# Patient Record
Sex: Male | Born: 1945 | Hispanic: No | Marital: Married | Smoking: Former smoker
Health system: Southern US, Community
[De-identification: ages and names within clinical notes are randomized; demographics above are authoritative.]

## PROBLEM LIST (undated history)

## (undated) DIAGNOSIS — J90 Pleural effusion, not elsewhere classified: Secondary | ICD-10-CM

## (undated) DIAGNOSIS — Z9989 Dependence on other enabling machines and devices: Secondary | ICD-10-CM

## (undated) DIAGNOSIS — I4891 Unspecified atrial fibrillation: Secondary | ICD-10-CM

## (undated) DIAGNOSIS — R202 Paresthesia of skin: Secondary | ICD-10-CM

## (undated) DIAGNOSIS — R42 Dizziness and giddiness: Secondary | ICD-10-CM

## (undated) DIAGNOSIS — K2270 Barrett's esophagus without dysplasia: Secondary | ICD-10-CM

## (undated) DIAGNOSIS — F32A Depression, unspecified: Secondary | ICD-10-CM

## (undated) DIAGNOSIS — K069 Disorder of gingiva and edentulous alveolar ridge, unspecified: Secondary | ICD-10-CM

## (undated) DIAGNOSIS — K449 Diaphragmatic hernia without obstruction or gangrene: Secondary | ICD-10-CM

## (undated) DIAGNOSIS — E785 Hyperlipidemia, unspecified: Secondary | ICD-10-CM

## (undated) DIAGNOSIS — N200 Calculus of kidney: Secondary | ICD-10-CM

## (undated) DIAGNOSIS — G4733 Obstructive sleep apnea (adult) (pediatric): Secondary | ICD-10-CM

## (undated) DIAGNOSIS — M21372 Foot drop, left foot: Secondary | ICD-10-CM

## (undated) DIAGNOSIS — K5909 Other constipation: Secondary | ICD-10-CM

## (undated) DIAGNOSIS — C801 Malignant (primary) neoplasm, unspecified: Secondary | ICD-10-CM

## (undated) DIAGNOSIS — I10 Essential (primary) hypertension: Secondary | ICD-10-CM

## (undated) DIAGNOSIS — I509 Heart failure, unspecified: Secondary | ICD-10-CM

## (undated) DIAGNOSIS — N189 Chronic kidney disease, unspecified: Secondary | ICD-10-CM

## (undated) DIAGNOSIS — F4310 Post-traumatic stress disorder, unspecified: Secondary | ICD-10-CM

## (undated) DIAGNOSIS — R200 Anesthesia of skin: Secondary | ICD-10-CM

## (undated) DIAGNOSIS — J189 Pneumonia, unspecified organism: Secondary | ICD-10-CM

## (undated) DIAGNOSIS — I2510 Atherosclerotic heart disease of native coronary artery without angina pectoris: Secondary | ICD-10-CM

## (undated) DIAGNOSIS — K635 Polyp of colon: Secondary | ICD-10-CM

## (undated) DIAGNOSIS — I639 Cerebral infarction, unspecified: Secondary | ICD-10-CM

## (undated) HISTORY — DX: Calculus of kidney: N20.0

## (undated) HISTORY — DX: Diaphragmatic hernia without obstruction or gangrene: K44.9

## (undated) HISTORY — PX: COLONOSCOPY: SHX174

## (undated) HISTORY — DX: Pleural effusion, not elsewhere classified: J90

## (undated) HISTORY — DX: Heart failure, unspecified: I50.9

## (undated) HISTORY — DX: Cerebral infarction, unspecified: I63.9

## (undated) HISTORY — DX: Polyp of colon: K63.5

## (undated) HISTORY — DX: Dizziness and giddiness: R42

## (undated) HISTORY — DX: Obstructive sleep apnea (adult) (pediatric): Z99.89

## (undated) HISTORY — DX: Anesthesia of skin: R20.0

## (undated) HISTORY — DX: Other constipation: K59.09

## (undated) HISTORY — DX: Chronic kidney disease, unspecified: N18.9

## (undated) HISTORY — DX: Atherosclerotic heart disease of native coronary artery without angina pectoris: I25.10

## (undated) HISTORY — DX: Hyperlipidemia, unspecified: E78.5

## (undated) HISTORY — PX: COLON SURGERY: SHX602

## (undated) HISTORY — DX: Paresthesia of skin: R20.2

## (undated) HISTORY — DX: Essential (primary) hypertension: I10

## (undated) HISTORY — DX: Unspecified atrial fibrillation: I48.91

## (undated) HISTORY — DX: Post-traumatic stress disorder, unspecified: F43.10

## (undated) HISTORY — PX: CORONARY ARTERY BYPASS GRAFT: SHX141

## (undated) HISTORY — DX: Disorder of gingiva and edentulous alveolar ridge, unspecified: K06.9

## (undated) HISTORY — DX: Pneumonia, unspecified organism: J18.9

## (undated) HISTORY — DX: Obstructive sleep apnea (adult) (pediatric): G47.33

## (undated) HISTORY — DX: Depression, unspecified: F32.A

## (undated) HISTORY — PX: CATARACT EXTRACTION: SUR2

## (undated) HISTORY — DX: Malignant (primary) neoplasm, unspecified: C80.1

## (undated) HISTORY — DX: Foot drop, left foot: M21.372

## (undated) HISTORY — DX: Barrett's esophagus without dysplasia: K22.70

---

## 2002-10-08 HISTORY — PX: CORONARY ARTERY BYPASS GRAFT: SHX141

## 2005-10-08 DIAGNOSIS — C61 Malignant neoplasm of prostate: Secondary | ICD-10-CM

## 2005-10-08 HISTORY — DX: Malignant neoplasm of prostate: C61

## 2006-10-08 DIAGNOSIS — J849 Interstitial pulmonary disease, unspecified: Secondary | ICD-10-CM

## 2006-10-08 HISTORY — DX: Interstitial pulmonary disease, unspecified: J84.9

## 2008-03-09 ENCOUNTER — Encounter: Admission: RE | Admit: 2008-03-09 | Discharge: 2008-04-12 | Payer: Self-pay | Admitting: Family Medicine

## 2008-08-07 ENCOUNTER — Emergency Department (HOSPITAL_COMMUNITY): Admission: EM | Admit: 2008-08-07 | Discharge: 2008-08-07 | Payer: Self-pay | Admitting: *Deleted

## 2009-07-21 ENCOUNTER — Emergency Department (HOSPITAL_COMMUNITY): Admission: EM | Admit: 2009-07-21 | Discharge: 2009-07-21 | Payer: Self-pay | Admitting: Emergency Medicine

## 2011-01-11 LAB — CBC
HCT: 40.6 % (ref 39.0–52.0)
Hemoglobin: 13.8 g/dL (ref 13.0–17.0)
MCHC: 34 g/dL (ref 30.0–36.0)
MCV: 90.6 fL (ref 78.0–100.0)
RBC: 4.48 MIL/uL (ref 4.22–5.81)
RDW: 13.4 % (ref 11.5–15.5)
WBC: 8.6 10*3/uL (ref 4.0–10.5)

## 2011-01-11 LAB — POCT I-STAT, CHEM 8
Calcium, Ion: 1.18 mmol/L (ref 1.12–1.32)
HCT: 39 % (ref 39.0–52.0)
Hemoglobin: 13.3 g/dL (ref 13.0–17.0)
Potassium: 5.5 mEq/L — ABNORMAL HIGH (ref 3.5–5.1)

## 2011-01-11 LAB — APTT: aPTT: 24 seconds (ref 24–37)

## 2011-01-11 LAB — URINALYSIS, ROUTINE W REFLEX MICROSCOPIC
Glucose, UA: NEGATIVE mg/dL
Ketones, ur: NEGATIVE mg/dL
Leukocytes, UA: NEGATIVE
Nitrite: NEGATIVE

## 2011-01-11 LAB — DIFFERENTIAL
Eosinophils Absolute: 0.1 10*3/uL (ref 0.0–0.7)
Eosinophils Relative: 1 % (ref 0–5)
Lymphocytes Relative: 25 % (ref 12–46)
Monocytes Absolute: 0.8 10*3/uL (ref 0.1–1.0)
Neutro Abs: 5.5 10*3/uL (ref 1.7–7.7)

## 2011-01-11 LAB — COMPREHENSIVE METABOLIC PANEL
AST: 25 U/L (ref 0–37)
Albumin: 3.4 g/dL — ABNORMAL LOW (ref 3.5–5.2)
Chloride: 107 mEq/L (ref 96–112)
Creatinine, Ser: 1.49 mg/dL (ref 0.4–1.5)
GFR calc Af Amer: 58 mL/min — ABNORMAL LOW (ref >60)
Sodium: 136 mEq/L (ref 135–145)
Total Protein: 6.6 g/dL (ref 6.0–8.3)

## 2011-01-11 LAB — URINE MICROSCOPIC-ADD ON

## 2011-01-11 LAB — LACTIC ACID, PLASMA: Lactic Acid, Venous: 2.2 mmol/L (ref 0.5–2.2)

## 2011-04-26 ENCOUNTER — Ambulatory Visit (INDEPENDENT_AMBULATORY_CARE_PROVIDER_SITE_OTHER): Payer: Self-pay | Admitting: Surgery

## 2011-04-26 ENCOUNTER — Ambulatory Visit (INDEPENDENT_AMBULATORY_CARE_PROVIDER_SITE_OTHER): Payer: Medicare Other | Admitting: Surgery

## 2011-04-26 ENCOUNTER — Encounter (INDEPENDENT_AMBULATORY_CARE_PROVIDER_SITE_OTHER): Payer: Self-pay | Admitting: Surgery

## 2011-04-26 ENCOUNTER — Telehealth (INDEPENDENT_AMBULATORY_CARE_PROVIDER_SITE_OTHER): Payer: Self-pay | Admitting: General Surgery

## 2011-04-26 VITALS — BP 100/62 | HR 72 | Temp 96.4°F | Ht 68.0 in | Wt 210.8 lb

## 2011-04-26 DIAGNOSIS — D126 Benign neoplasm of colon, unspecified: Secondary | ICD-10-CM

## 2011-04-26 DIAGNOSIS — K635 Polyp of colon: Secondary | ICD-10-CM

## 2011-04-26 NOTE — Progress Notes (Signed)
Subjective:     Patient ID: Walter [name], male   DOB: [birth year], 65 y.o.   MRN: [id number]  HPIMr. Ruan and his wife came in today to discuss the recurrent polyp that he's had in his hepatic flexure. Dr. Jeannetta Ellis has been following and debulking a benign polyp in his hepatic flexure (according to the patient procedure closed that recently has shown more tendency toward dysplasia. This has been evolved over the last 3 years and now Dr. Jake Shark is recommended resection.  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  They have talked about proceeding with colectomy. We discussed many questions about the surgery and its risks and complications. He seems to understand these very well. My approach would be a laparoscopically assisted right hemicolectomy. I encouraged him to go on a two-week low carb diet preoperatively to get himself and better metabolic shape. He is a type II diabetic followed by [facility] endocrinology.  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  He is on and do blood thinner because he has a tendency to have episodes of atrial fibrillation. He is followed by Garnette Scheuermann.  Plan to admit him to [facility] long for a laparoscopically assisted right hemicolectomy. Dr. Jeannetta Ellis may colonoscope him a preop for either further tattooing or marking the area. Last Dr. Katrinka Blazing to clear from the standpoint of his heart.     Review of Systems  Genitourinary:       He has a congenitally absent left kidney  All other systems reviewed and are negative.       Objective:   Physical Exam  Vitals reviewed. Constitutional: He is oriented to person, place, and time. He appears well-developed and well-nourished.  HENT:  Head: Normocephalic and atraumatic.  Eyes: Pupils are equal, round, and reactive to light.  Neck: Normal range of motion. Neck supple.  Cardiovascular: Normal rate, regular rhythm and normal heart sounds.   Pulmonary/Chest: Effort normal and  breath sounds normal.  Abdominal: Soft. Bowel sounds are normal. He exhibits no distension and no mass. There is no tenderness. There is no rebound and no guarding.  Musculoskeletal: Normal range of motion.  Neurological: He is alert and oriented to person, place, and time. He has normal reflexes.  Skin: Skin is warm and dry.  Psychiatric: He has a normal mood and affect. His behavior is normal. Judgment and thought content normal.       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 Plan laparoscopically assisted right hemicolectomy     Plan:    Plan laparoscopically assisted right hemicolectomy

## 2011-04-26 NOTE — Telephone Encounter (Signed)
Verified Walter [name] was a patient of Dr Katrinka Blazing faxed order for cardiac clearance to Lupita Leash [phone]

## 2011-05-08 ENCOUNTER — Telehealth (INDEPENDENT_AMBULATORY_CARE_PROVIDER_SITE_OTHER): Payer: Self-pay | Admitting: General Surgery

## 2011-05-08 DIAGNOSIS — K635 Polyp of colon: Secondary | ICD-10-CM

## 2011-05-08 MED ORDER — PEG 3350-KCL-NA BICARB-NACL 420 G PO SOLR: 4000.0000 mL | Freq: Once | ORAL | Status: AC

## 2011-05-08 NOTE — Telephone Encounter (Signed)
Called rx of nulytely to [facility] [address]  and sent instructions to patient TA

## 2011-05-09 ENCOUNTER — Other Ambulatory Visit (INDEPENDENT_AMBULATORY_CARE_PROVIDER_SITE_OTHER): Payer: Self-pay | Admitting: Surgery

## 2011-05-09 ENCOUNTER — Ambulatory Visit (HOSPITAL_COMMUNITY)
Admission: RE | Admit: 2011-05-09 | Discharge: 2011-05-09 | Disposition: A | Discharge status: Home / Self Care | Payer: Medicare Other | Source: Ambulatory Visit | Attending: Surgery | Admitting: Surgery

## 2011-05-09 ENCOUNTER — Encounter (HOSPITAL_COMMUNITY): Payer: Medicare Other

## 2011-05-09 DIAGNOSIS — Z8701 Personal history of pneumonia (recurrent): Secondary | ICD-10-CM | POA: Insufficient documentation

## 2011-05-09 DIAGNOSIS — Z951 Presence of aortocoronary bypass graft: Secondary | ICD-10-CM | POA: Insufficient documentation

## 2011-05-09 DIAGNOSIS — Z01818 Encounter for other preprocedural examination: Secondary | ICD-10-CM

## 2011-05-09 DIAGNOSIS — I10 Essential (primary) hypertension: Secondary | ICD-10-CM | POA: Insufficient documentation

## 2011-05-09 DIAGNOSIS — Z01811 Encounter for preprocedural respiratory examination: Secondary | ICD-10-CM | POA: Insufficient documentation

## 2011-05-09 DIAGNOSIS — D126 Benign neoplasm of colon, unspecified: Secondary | ICD-10-CM | POA: Insufficient documentation

## 2011-05-09 DIAGNOSIS — Z79899 Other long term (current) drug therapy: Secondary | ICD-10-CM | POA: Insufficient documentation

## 2011-05-09 HISTORY — PX: COLON SURGERY: SHX602

## 2011-05-09 LAB — BASIC METABOLIC PANEL
BUN: 19 mg/dL (ref 6–23)
CO2: 27 mEq/L (ref 19–32)
Calcium: 10.1 mg/dL (ref 8.4–10.5)
Chloride: 103 mEq/L (ref 96–112)
GFR calc Af Amer: >60 mL/min (ref >60)
Glucose, Bld: 158 mg/dL — ABNORMAL HIGH (ref 70–99)
Potassium: 5.1 mEq/L (ref 3.5–5.1)
Sodium: 137 mEq/L (ref 135–145)

## 2011-05-09 LAB — CBC
HCT: 40.6 % (ref 39.0–52.0)
MCHC: 35 g/dL (ref 30.0–36.0)
MCV: 87.3 fL (ref 78.0–100.0)
Platelets: 205 10*3/uL (ref 150–400)
RBC: 4.65 MIL/uL (ref 4.22–5.81)

## 2011-05-09 LAB — PROTIME-INR: Prothrombin Time: 19.3 seconds — ABNORMAL HIGH (ref 11.6–15.2)

## 2011-05-09 LAB — SURGICAL PCR SCREEN: MRSA, PCR: NEGATIVE

## 2011-05-16 ENCOUNTER — Ambulatory Visit
Admission: RE | Admit: 2011-05-16 | Discharge: 2011-05-16 | Disposition: A | Discharge status: Home / Self Care | Payer: Medicare Other | Source: Ambulatory Visit | Attending: Gastroenterology | Admitting: Gastroenterology

## 2011-05-16 ENCOUNTER — Telehealth (INDEPENDENT_AMBULATORY_CARE_PROVIDER_SITE_OTHER): Payer: Self-pay | Admitting: Surgery

## 2011-05-16 ENCOUNTER — Other Ambulatory Visit: Payer: Self-pay | Admitting: Gastroenterology

## 2011-05-16 DIAGNOSIS — IMO0002 Reserved for concepts with insufficient information to code with codable children: Secondary | ICD-10-CM

## 2011-05-21 ENCOUNTER — Inpatient Hospital Stay (HOSPITAL_COMMUNITY)
Admission: RE | Admit: 2011-05-21 | Discharge: 2011-05-28 | DRG: 331 | Disposition: A | Discharge status: Home / Self Care | Payer: Medicare Other | Source: Ambulatory Visit | Attending: Surgery | Admitting: Surgery

## 2011-05-21 ENCOUNTER — Other Ambulatory Visit (INDEPENDENT_AMBULATORY_CARE_PROVIDER_SITE_OTHER): Payer: Self-pay | Admitting: Surgery

## 2011-05-21 DIAGNOSIS — Z01812 Encounter for preprocedural laboratory examination: Secondary | ICD-10-CM

## 2011-05-21 DIAGNOSIS — J449 Chronic obstructive pulmonary disease, unspecified: Secondary | ICD-10-CM | POA: Diagnosis present

## 2011-05-21 DIAGNOSIS — D126 Benign neoplasm of colon, unspecified: Secondary | ICD-10-CM

## 2011-05-21 DIAGNOSIS — J4489 Other specified chronic obstructive pulmonary disease: Secondary | ICD-10-CM | POA: Diagnosis present

## 2011-05-21 DIAGNOSIS — I10 Essential (primary) hypertension: Secondary | ICD-10-CM | POA: Diagnosis present

## 2011-05-21 DIAGNOSIS — F17200 Nicotine dependence, unspecified, uncomplicated: Secondary | ICD-10-CM | POA: Diagnosis present

## 2011-05-21 DIAGNOSIS — Z951 Presence of aortocoronary bypass graft: Secondary | ICD-10-CM

## 2011-05-21 DIAGNOSIS — I2510 Atherosclerotic heart disease of native coronary artery without angina pectoris: Secondary | ICD-10-CM | POA: Diagnosis present

## 2011-05-21 DIAGNOSIS — I4891 Unspecified atrial fibrillation: Secondary | ICD-10-CM | POA: Diagnosis present

## 2011-05-21 LAB — ABO/RH: ABO/RH(D): B POS

## 2011-05-21 LAB — GLUCOSE, CAPILLARY
Glucose-Capillary: 182 mg/dL — ABNORMAL HIGH (ref 70–99)
Glucose-Capillary: 216 mg/dL — ABNORMAL HIGH (ref 70–99)

## 2011-05-21 LAB — PROTIME-INR
INR: 0.99 (ref 0.00–1.49)
INR: 1 (ref 0.00–1.49)

## 2011-05-21 LAB — TYPE AND SCREEN: ABO/RH(D): B POS

## 2011-05-22 LAB — COMPREHENSIVE METABOLIC PANEL
ALT: 15 U/L (ref 0–53)
CO2: 30 mEq/L (ref 19–32)
Calcium: 9.1 mg/dL (ref 8.4–10.5)
GFR calc Af Amer: >60 mL/min (ref >60)
GFR calc non Af Amer: >60 mL/min (ref >60)
Glucose, Bld: 186 mg/dL — ABNORMAL HIGH (ref 70–99)
Sodium: 137 mEq/L (ref 135–145)
Total Bilirubin: 1.1 mg/dL (ref 0.3–1.2)

## 2011-05-22 LAB — CBC
Hemoglobin: 12.7 g/dL — ABNORMAL LOW (ref 13.0–17.0)
MCH: 31 pg (ref 26.0–34.0)
MCV: 87.1 fL (ref 78.0–100.0)
RBC: 4.1 MIL/uL — ABNORMAL LOW (ref 4.22–5.81)

## 2011-05-22 LAB — GLUCOSE, CAPILLARY
Glucose-Capillary: 170 mg/dL — ABNORMAL HIGH (ref 70–99)
Glucose-Capillary: 144 mg/dL — ABNORMAL HIGH (ref 70–99)

## 2011-05-22 LAB — DIFFERENTIAL
Basophils Relative: 0 % (ref 0–1)
Lymphs Abs: 1.6 10*3/uL (ref 0.7–4.0)
Monocytes Relative: 12 % (ref 3–12)
Neutro Abs: 5.9 10*3/uL (ref 1.7–7.7)
Neutrophils Relative %: 70 % (ref 43–77)

## 2011-05-23 LAB — GLUCOSE, CAPILLARY
Glucose-Capillary: 143 mg/dL — ABNORMAL HIGH (ref 70–99)
Glucose-Capillary: 147 mg/dL — ABNORMAL HIGH (ref 70–99)
Glucose-Capillary: 161 mg/dL — ABNORMAL HIGH (ref 70–99)
Glucose-Capillary: 166 mg/dL — ABNORMAL HIGH (ref 70–99)

## 2011-05-24 LAB — GLUCOSE, CAPILLARY
Glucose-Capillary: 141 mg/dL — ABNORMAL HIGH (ref 70–99)
Glucose-Capillary: 147 mg/dL — ABNORMAL HIGH (ref 70–99)
Glucose-Capillary: 158 mg/dL — ABNORMAL HIGH (ref 70–99)
Glucose-Capillary: 196 mg/dL — ABNORMAL HIGH (ref 70–99)

## 2011-05-25 LAB — GLUCOSE, CAPILLARY: Glucose-Capillary: 126 mg/dL — ABNORMAL HIGH (ref 70–99)

## 2011-05-26 LAB — GLUCOSE, CAPILLARY
Glucose-Capillary: 175 mg/dL — ABNORMAL HIGH (ref 70–99)
Glucose-Capillary: 173 mg/dL — ABNORMAL HIGH (ref 70–99)

## 2011-05-26 LAB — CBC
Hemoglobin: 12.5 g/dL — ABNORMAL LOW (ref 13.0–17.0)
MCH: 30.5 pg (ref 26.0–34.0)
MCV: 86.8 fL (ref 78.0–100.0)
RBC: 4.1 MIL/uL — ABNORMAL LOW (ref 4.22–5.81)

## 2011-05-26 LAB — DIFFERENTIAL
Lymphs Abs: 1.6 10*3/uL (ref 0.7–4.0)
Monocytes Relative: 15 % — ABNORMAL HIGH (ref 3–12)
Neutro Abs: 3.4 10*3/uL (ref 1.7–7.7)
Neutrophils Relative %: 57 % (ref 43–77)

## 2011-05-26 LAB — BASIC METABOLIC PANEL
CO2: 30 mEq/L (ref 19–32)
Calcium: 9.6 mg/dL (ref 8.4–10.5)
Creatinine, Ser: 0.88 mg/dL (ref 0.50–1.35)
Glucose, Bld: 134 mg/dL — ABNORMAL HIGH (ref 70–99)

## 2011-05-27 LAB — GLUCOSE, CAPILLARY
Glucose-Capillary: 189 mg/dL — ABNORMAL HIGH (ref 70–99)
Glucose-Capillary: 179 mg/dL — ABNORMAL HIGH (ref 70–99)
Glucose-Capillary: 146 mg/dL — ABNORMAL HIGH (ref 70–99)

## 2011-05-28 LAB — BASIC METABOLIC PANEL
CO2: 29 mEq/L (ref 19–32)
Calcium: 9.1 mg/dL (ref 8.4–10.5)
Chloride: 97 mEq/L (ref 96–112)
Glucose, Bld: 173 mg/dL — ABNORMAL HIGH (ref 70–99)
Sodium: 136 mEq/L (ref 135–145)

## 2011-05-28 LAB — DIFFERENTIAL
Basophils Relative: 0 % (ref 0–1)
Lymphs Abs: 1.1 10*3/uL (ref 0.7–4.0)
Monocytes Relative: 24 % — ABNORMAL HIGH (ref 3–12)
Neutro Abs: 2 10*3/uL (ref 1.7–7.7)
Neutrophils Relative %: 48 % (ref 43–77)

## 2011-05-28 LAB — CBC
Hemoglobin: 11.7 g/dL — ABNORMAL LOW (ref 13.0–17.0)
MCH: 30.5 pg (ref 26.0–34.0)
RBC: 3.84 MIL/uL — ABNORMAL LOW (ref 4.22–5.81)
WBC: 4.3 10*3/uL (ref 4.0–10.5)

## 2011-05-30 ENCOUNTER — Other Ambulatory Visit (INDEPENDENT_AMBULATORY_CARE_PROVIDER_SITE_OTHER): Payer: Self-pay | Admitting: General Surgery

## 2011-05-30 ENCOUNTER — Encounter (INDEPENDENT_AMBULATORY_CARE_PROVIDER_SITE_OTHER): Payer: Self-pay | Admitting: Surgery

## 2011-05-30 ENCOUNTER — Ambulatory Visit (INDEPENDENT_AMBULATORY_CARE_PROVIDER_SITE_OTHER): Payer: Medicare Other | Admitting: Surgery

## 2011-05-30 VITALS — Temp 96.5°F

## 2011-05-30 DIAGNOSIS — Z9049 Acquired absence of other specified parts of digestive tract: Secondary | ICD-10-CM

## 2011-05-30 DIAGNOSIS — Z9889 Other specified postprocedural states: Secondary | ICD-10-CM

## 2011-05-30 MED ORDER — OXYCODONE-ACETAMINOPHEN 5-325 MG PO TABS: 1.0000 | ORAL_TABLET | ORAL | Status: AC | PRN

## 2011-05-30 NOTE — Patient Instructions (Signed)
Apply Neosporin daily to wound Take probiotics after completing antibiotics

## 2011-05-30 NOTE — Progress Notes (Signed)
Today he is a followup after her right hemicolectomy complicated by a lateral wound infection. I opened this in the hospital and cultured it. Culture was no growth at 2 days but on Gram stain there gram-negative rods. He is on 5 days of Augmentin. Today his incision looks good. There is minimal drainage laterally. He removed his wick the day after he got home from the hospital. Her recommended probiotics. We'll see him back in 2-3 weeks. Pathology showed no residual polyps.

## 2011-05-31 LAB — WOUND CULTURE: Culture: NO GROWTH

## 2011-06-06 ENCOUNTER — Telehealth (INDEPENDENT_AMBULATORY_CARE_PROVIDER_SITE_OTHER): Payer: Self-pay | Admitting: General Surgery

## 2011-06-13 NOTE — Op Note (Signed)
NAMECHAROD, SLAWINSKI NO.:  [id number]  MEDICAL RECORD NO.:  [id number]  LOCATION:  1525                         FACILITY:  [facility]  PHYSICIAN:  Thornton Park. Daphine Deutscher, MD  DATE OF BIRTH:  14-Jan-1946  DATE OF PROCEDURE: DATE OF DISCHARGE:                              OPERATIVE REPORT   PREOPERATIVE DIAGNOSIS:  Recurrent sessile polyp of the right colon, status post multiple resections, and following by Dr. Matthias Hughs with recent microscopic change more toward dysplasia.  Laparoscopic-assisted right hemicolectomy.  SURGEON:  Thornton Park. Daphine Deutscher, MD  ASSISTANT:  Sandria Bales. Ezzard Standing, MD  ANESTHESIA:  General endotracheal.  DESCRIPTION OF PROCEDURE:  A 65 year old white male was brought to room #1 at [facility] on Monday, May 21, 2011, and given general anesthesia.  The abdomen was prepped with PCMX and draped sterilely. Access to the abdomen was achieved through the left upper quadrant using a 0 degree 5 mm Optiview without difficulty.  The abdomen was inflated and then 3 other 5 mm were used to assist in the mobilization of the right colon.  The patient had a very large omental mass as well as a very fat mesentery.  Dr. Matthias Hughs had tattooed there in and this was readily visible along the ascending colon.  Using the Harmonic scalpel, I mobilized hepatic flexure, the right colon gutter, and the terminal ileum without much difficulty.  I swept across the kidney and visualize the duodenum.  I incorporated one of my incisions which was a transverse trocar incision above the umbilicus and made a transverse incision probably total of 12 cm long.  After I completed it, put in the [facility] wound protector.  I mobilized the colon little more getting the terminal ileum out along with the transverse colon well beyond the tattooed area.  I divided them proximally and distally with GIA and went to the mesentery with Kelly clamps, the Harmonic scalpel, and I oversewed the  staying side with 2-0 silk suture ligatures.  A functional end-to-end anastomosis was created by laying the antimesenteric borders side each other into the tenia.  I opened and then inserted the 7.5 GIA, firing creating a common defect.  The staple line was dry.  I closed the common opening from either end with 4-0 PDS in a running canal fashion and with interrupted 3-0 silks.  It was then returned into the abdomen and abdomen was irrigated.  No bleeding was noted.  Next, I closed the abdomen.  I did this after I inspected the specimen myself and found the clip Dr. Matthias Hughs had applied in the tattooed area and I had good margins proximally and distally.  The abdomen was closed with a running #1 PDS from either end and tied in the middle.  The anterior layer was closed with running #1 PDS tied in the middle.  Exparel was injected in the incision and along the trocar sites.  Wound was irrigated and closed with 4-0 Vicryl and with staples. The patient tolerated the procedure well and was taken to recovery room in satisfactory addition.  Thornton Park Daphine Deutscher, MD     MBM/MEDQ  D:  05/21/2011  T:  05/22/2011  Job:  [id number]  cc:   Bernette Redbird, M.D. Fax: [phone]  Dr. __________  Electronically Signed by Luretha Murphy MD on 06/13/2011 01:34:37 PM

## 2011-06-15 ENCOUNTER — Ambulatory Visit (INDEPENDENT_AMBULATORY_CARE_PROVIDER_SITE_OTHER): Payer: Medicare Other | Admitting: Surgery

## 2011-06-15 VITALS — BP 124/74 | HR 76

## 2011-06-15 DIAGNOSIS — Z9049 Acquired absence of other specified parts of digestive tract: Secondary | ICD-10-CM

## 2011-06-15 DIAGNOSIS — Z9889 Other specified postprocedural states: Secondary | ICD-10-CM

## 2011-06-15 NOTE — Progress Notes (Signed)
Walter [name] return for followup.  He has 2 areas of the lateral part of his incision freed appears to have some superficial drainage. I'm not certain that he may be spitting suture work could be a residual from a wound infection in a diabetic post colectomy patient. I cauterize these 2 areas with silver nitrate. He will call if it's still draining a week and a 2:30 weeks. They will be leaving in about 3 weeks ago in [place]. Otherwise I'll see him back for routine followup in 2 months. Impression doing well except for this minor issue with this incision.

## 2011-06-20 NOTE — Discharge Summary (Signed)
  [name], [name] NO.:  [id number]  MEDICAL RECORD NO.:  [id number]  LOCATION:  1525                         FACILITY:  [facility]  PHYSICIAN:  Thornton Park. 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  DISCHARGE DIAGNOSIS:  No residual dysplastic colon in area of previous polypectomy.  COURSE IN HOSPITAL:  Walter [name] is a 65 year old white male who underwent a laparoscopically assisted right hemicolectomy on May 21, 2011.  Postop day #1, he was doing well and managed his pain with PCA pump.  We left his Foley in because of some difficulty voiding and he had to be re-cathed.  We restarted his beta-blockers as soon as he was able to swallow.  We removed his Foley, he was able to void.  Pathology report came back, there was no residual dysplastic or cancer in his specimen.  The patient has underlying type 2 diabetes.  We monitored that with CBGs and we have had him on fairly adequate postoperative diabetic intervention.  He was begun on clear liquids on postop day #3, postop day #4, and continued to do well.  Postop day #5, he developed a litter flare on lateral aspect of his wound which I opened and cultured.  This culture did not grow anything.  However, I left him in the hospital a litter longer for that.  He was ready for discharge, postop day #7 and was discharged to follow up in the office in 1 week.  He was discharged home on some Augmentin to take.  FINAL DIAGNOSIS:  Status post right hemicolectomy, no residual dysplastic polyp in the specimen.  CONDITION:  Good.     Thornton Park Daphine Deutscher, MD     MBM/MEDQ  D:  06/14/2011  T:  06/14/2011  Job:  [id number]  Electronically Signed by Luretha Murphy MD on 06/20/2011 07:38:08 AM

## 2011-06-21 ENCOUNTER — Ambulatory Visit (INDEPENDENT_AMBULATORY_CARE_PROVIDER_SITE_OTHER): Payer: Medicare Other | Admitting: Surgery

## 2011-06-21 VITALS — BP 122/66 | HR 66 | Temp 96.8°F

## 2011-06-21 DIAGNOSIS — T8149XA Infection following a procedure, other surgical site, initial encounter: Secondary | ICD-10-CM

## 2011-06-21 DIAGNOSIS — T8140XA Infection following a procedure, unspecified, initial encounter: Secondary | ICD-10-CM

## 2011-06-21 NOTE — Progress Notes (Signed)
Walter [name] has opened the medial part of his incision in an area about 1 cm in length.  There are a couple of little open areas in this segment that have drained.  Doubt spitting suture.  Probed with Q tip.  Not deep.  H202 applied.  Will treat with 5 days of Keflex 500 BID and will see back in 1 week.

## 2011-06-27 ENCOUNTER — Ambulatory Visit (INDEPENDENT_AMBULATORY_CARE_PROVIDER_SITE_OTHER): Payer: Medicare Other | Admitting: Surgery

## 2011-06-27 ENCOUNTER — Encounter (INDEPENDENT_AMBULATORY_CARE_PROVIDER_SITE_OTHER): Payer: Self-pay | Admitting: Surgery

## 2011-06-27 VITALS — BP 110/68 | HR 60 | Temp 97.4°F | Resp 16 | Ht 67.0 in | Wt 205.6 lb

## 2011-06-27 DIAGNOSIS — Z09 Encounter for follow-up examination after completed treatment for conditions other than malignant neoplasm: Secondary | ICD-10-CM

## 2011-07-04 NOTE — Progress Notes (Signed)
Wound check by French Ana. Doing better.

## 2011-07-10 LAB — URINE MICROSCOPIC-ADD ON

## 2011-07-10 LAB — COMPREHENSIVE METABOLIC PANEL
ALT: 31
AST: 27
CO2: 25
Calcium: 9.6
Creatinine, Ser: 1.22
GFR calc Af Amer: >60
GFR calc non Af Amer: >60
Sodium: 139
Total Protein: 7.1

## 2011-07-10 LAB — CBC
MCHC: 33.8
MCV: 91.1
RBC: 4.66
RDW: 13.6

## 2011-07-10 LAB — URINALYSIS, ROUTINE W REFLEX MICROSCOPIC
Bilirubin Urine: NEGATIVE
Glucose, UA: NEGATIVE
Ketones, ur: NEGATIVE
Nitrite: NEGATIVE
Specific Gravity, Urine: 1.022
pH: 5

## 2011-07-10 LAB — DIFFERENTIAL
Eosinophils Relative: 2
Lymphocytes Relative: 31
Lymphs Abs: 1.7
Monocytes Relative: 9
Neutrophils Relative %: 57

## 2011-07-10 LAB — LIPASE, BLOOD: Lipase: 60 — ABNORMAL HIGH

## 2011-08-15 ENCOUNTER — Encounter (INDEPENDENT_AMBULATORY_CARE_PROVIDER_SITE_OTHER): Payer: Self-pay | Admitting: Surgery

## 2011-08-15 ENCOUNTER — Ambulatory Visit (INDEPENDENT_AMBULATORY_CARE_PROVIDER_SITE_OTHER): Payer: Medicare Other | Admitting: Surgery

## 2011-08-15 VITALS — BP 148/84 | HR 60 | Temp 96.8°F | Resp 18 | Ht 67.0 in | Wt 213.0 lb

## 2011-08-15 DIAGNOSIS — Z9049 Acquired absence of other specified parts of digestive tract: Secondary | ICD-10-CM

## 2011-08-15 DIAGNOSIS — Z9889 Other specified postprocedural states: Secondary | ICD-10-CM

## 2011-08-15 NOTE — Progress Notes (Signed)
Walter [name] EAVW8 y.o.  There is no problem list on file for this patient.  No Known Allergies @SURG @ [name] he is doing well after his right colectomy. His incision is healed and there is no drainage from his skin sutures. His bowels are getting back to normal and he feels good. He will followup with Dr. Matthias Hughs for subsequent colonoscopies.  Return p.r.n. Matt B. Daphine Deutscher, MD, [facility] [phone] beeper [phone]  08/15/2011 9:50 AM

## 2011-10-03 NOTE — Telephone Encounter (Signed)
Erroneous encounter

## 2011-11-26 DIAGNOSIS — N182 Chronic kidney disease, stage 2 (mild): Secondary | ICD-10-CM | POA: Diagnosis not present

## 2011-11-26 DIAGNOSIS — E669 Obesity, unspecified: Secondary | ICD-10-CM | POA: Diagnosis not present

## 2011-11-26 DIAGNOSIS — E1129 Type 2 diabetes mellitus with other diabetic kidney complication: Secondary | ICD-10-CM | POA: Diagnosis not present

## 2011-12-13 DIAGNOSIS — K219 Gastro-esophageal reflux disease without esophagitis: Secondary | ICD-10-CM | POA: Diagnosis not present

## 2011-12-13 DIAGNOSIS — Z1331 Encounter for screening for depression: Secondary | ICD-10-CM | POA: Diagnosis not present

## 2011-12-13 DIAGNOSIS — F4310 Post-traumatic stress disorder, unspecified: Secondary | ICD-10-CM | POA: Diagnosis not present

## 2011-12-13 DIAGNOSIS — I2510 Atherosclerotic heart disease of native coronary artery without angina pectoris: Secondary | ICD-10-CM | POA: Diagnosis not present

## 2012-02-05 DIAGNOSIS — I10 Essential (primary) hypertension: Secondary | ICD-10-CM | POA: Diagnosis not present

## 2012-02-05 DIAGNOSIS — Z7901 Long term (current) use of anticoagulants: Secondary | ICD-10-CM | POA: Diagnosis not present

## 2012-02-05 DIAGNOSIS — I2510 Atherosclerotic heart disease of native coronary artery without angina pectoris: Secondary | ICD-10-CM | POA: Diagnosis not present

## 2012-02-05 DIAGNOSIS — I4891 Unspecified atrial fibrillation: Secondary | ICD-10-CM | POA: Diagnosis not present

## 2012-02-05 DIAGNOSIS — E782 Mixed hyperlipidemia: Secondary | ICD-10-CM | POA: Diagnosis not present

## 2012-02-26 DIAGNOSIS — E1129 Type 2 diabetes mellitus with other diabetic kidney complication: Secondary | ICD-10-CM | POA: Diagnosis not present

## 2012-02-26 DIAGNOSIS — E669 Obesity, unspecified: Secondary | ICD-10-CM | POA: Diagnosis not present

## 2012-02-26 DIAGNOSIS — E1165 Type 2 diabetes mellitus with hyperglycemia: Secondary | ICD-10-CM | POA: Diagnosis not present

## 2012-02-26 DIAGNOSIS — N182 Chronic kidney disease, stage 2 (mild): Secondary | ICD-10-CM | POA: Diagnosis not present

## 2012-03-28 DIAGNOSIS — Z961 Presence of intraocular lens: Secondary | ICD-10-CM | POA: Diagnosis not present

## 2012-03-28 DIAGNOSIS — H52209 Unspecified astigmatism, unspecified eye: Secondary | ICD-10-CM | POA: Diagnosis not present

## 2012-03-28 DIAGNOSIS — E119 Type 2 diabetes mellitus without complications: Secondary | ICD-10-CM | POA: Diagnosis not present

## 2012-04-21 DIAGNOSIS — H11129 Conjunctival concretions, unspecified eye: Secondary | ICD-10-CM | POA: Diagnosis not present

## 2012-06-03 DIAGNOSIS — N182 Chronic kidney disease, stage 2 (mild): Secondary | ICD-10-CM | POA: Diagnosis not present

## 2012-06-03 DIAGNOSIS — E1165 Type 2 diabetes mellitus with hyperglycemia: Secondary | ICD-10-CM | POA: Diagnosis not present

## 2012-06-03 DIAGNOSIS — E1129 Type 2 diabetes mellitus with other diabetic kidney complication: Secondary | ICD-10-CM | POA: Diagnosis not present

## 2012-06-03 DIAGNOSIS — E669 Obesity, unspecified: Secondary | ICD-10-CM | POA: Diagnosis not present

## 2012-06-16 DIAGNOSIS — E1165 Type 2 diabetes mellitus with hyperglycemia: Secondary | ICD-10-CM | POA: Diagnosis not present

## 2012-06-16 DIAGNOSIS — E1129 Type 2 diabetes mellitus with other diabetic kidney complication: Secondary | ICD-10-CM | POA: Diagnosis not present

## 2012-07-02 DIAGNOSIS — E785 Hyperlipidemia, unspecified: Secondary | ICD-10-CM | POA: Diagnosis not present

## 2012-07-02 DIAGNOSIS — E1165 Type 2 diabetes mellitus with hyperglycemia: Secondary | ICD-10-CM | POA: Diagnosis not present

## 2012-07-02 DIAGNOSIS — Z23 Encounter for immunization: Secondary | ICD-10-CM | POA: Diagnosis not present

## 2012-07-02 DIAGNOSIS — E1129 Type 2 diabetes mellitus with other diabetic kidney complication: Secondary | ICD-10-CM | POA: Diagnosis not present

## 2012-07-02 DIAGNOSIS — Z1331 Encounter for screening for depression: Secondary | ICD-10-CM | POA: Diagnosis not present

## 2012-07-02 DIAGNOSIS — Z0000 Encounter for general adult medical examination without abnormal findings: Secondary | ICD-10-CM | POA: Diagnosis not present

## 2012-07-16 DIAGNOSIS — E1129 Type 2 diabetes mellitus with other diabetic kidney complication: Secondary | ICD-10-CM | POA: Diagnosis not present

## 2012-07-16 DIAGNOSIS — N182 Chronic kidney disease, stage 2 (mild): Secondary | ICD-10-CM | POA: Diagnosis not present

## 2012-07-16 DIAGNOSIS — E669 Obesity, unspecified: Secondary | ICD-10-CM | POA: Diagnosis not present

## 2012-07-29 DIAGNOSIS — E119 Type 2 diabetes mellitus without complications: Secondary | ICD-10-CM | POA: Diagnosis not present

## 2012-07-29 DIAGNOSIS — H43819 Vitreous degeneration, unspecified eye: Secondary | ICD-10-CM | POA: Diagnosis not present

## 2012-09-08 DIAGNOSIS — H43819 Vitreous degeneration, unspecified eye: Secondary | ICD-10-CM | POA: Diagnosis not present

## 2012-09-15 DIAGNOSIS — C61 Malignant neoplasm of prostate: Secondary | ICD-10-CM | POA: Diagnosis not present

## 2012-09-22 DIAGNOSIS — C61 Malignant neoplasm of prostate: Secondary | ICD-10-CM | POA: Diagnosis not present

## 2012-09-22 DIAGNOSIS — R3915 Urgency of urination: Secondary | ICD-10-CM | POA: Diagnosis not present

## 2012-09-22 DIAGNOSIS — N529 Male erectile dysfunction, unspecified: Secondary | ICD-10-CM | POA: Diagnosis not present

## 2012-11-19 DIAGNOSIS — N182 Chronic kidney disease, stage 2 (mild): Secondary | ICD-10-CM | POA: Diagnosis not present

## 2012-11-19 DIAGNOSIS — E669 Obesity, unspecified: Secondary | ICD-10-CM | POA: Diagnosis not present

## 2012-11-19 DIAGNOSIS — E1129 Type 2 diabetes mellitus with other diabetic kidney complication: Secondary | ICD-10-CM | POA: Diagnosis not present

## 2012-12-08 DIAGNOSIS — I4891 Unspecified atrial fibrillation: Secondary | ICD-10-CM | POA: Diagnosis not present

## 2012-12-08 DIAGNOSIS — Z79899 Other long term (current) drug therapy: Secondary | ICD-10-CM | POA: Diagnosis not present

## 2013-02-03 DIAGNOSIS — I2510 Atherosclerotic heart disease of native coronary artery without angina pectoris: Secondary | ICD-10-CM | POA: Diagnosis not present

## 2013-02-03 DIAGNOSIS — I10 Essential (primary) hypertension: Secondary | ICD-10-CM | POA: Diagnosis not present

## 2013-02-03 DIAGNOSIS — I4891 Unspecified atrial fibrillation: Secondary | ICD-10-CM | POA: Diagnosis not present

## 2013-02-03 DIAGNOSIS — Z7901 Long term (current) use of anticoagulants: Secondary | ICD-10-CM | POA: Diagnosis not present

## 2013-02-03 DIAGNOSIS — IMO0001 Reserved for inherently not codable concepts without codable children: Secondary | ICD-10-CM | POA: Diagnosis not present

## 2013-02-11 DIAGNOSIS — IMO0001 Reserved for inherently not codable concepts without codable children: Secondary | ICD-10-CM | POA: Diagnosis not present

## 2013-02-11 DIAGNOSIS — I4891 Unspecified atrial fibrillation: Secondary | ICD-10-CM | POA: Diagnosis not present

## 2013-02-11 DIAGNOSIS — I2510 Atherosclerotic heart disease of native coronary artery without angina pectoris: Secondary | ICD-10-CM | POA: Diagnosis not present

## 2013-02-11 DIAGNOSIS — I10 Essential (primary) hypertension: Secondary | ICD-10-CM | POA: Diagnosis not present

## 2013-02-11 DIAGNOSIS — Z7901 Long term (current) use of anticoagulants: Secondary | ICD-10-CM | POA: Diagnosis not present

## 2013-03-17 DIAGNOSIS — N182 Chronic kidney disease, stage 2 (mild): Secondary | ICD-10-CM | POA: Diagnosis not present

## 2013-03-17 DIAGNOSIS — Z23 Encounter for immunization: Secondary | ICD-10-CM | POA: Diagnosis not present

## 2013-03-17 DIAGNOSIS — E669 Obesity, unspecified: Secondary | ICD-10-CM | POA: Diagnosis not present

## 2013-03-17 DIAGNOSIS — E1129 Type 2 diabetes mellitus with other diabetic kidney complication: Secondary | ICD-10-CM | POA: Diagnosis not present

## 2013-06-11 DIAGNOSIS — Z79899 Other long term (current) drug therapy: Secondary | ICD-10-CM | POA: Diagnosis not present

## 2013-06-11 DIAGNOSIS — I4891 Unspecified atrial fibrillation: Secondary | ICD-10-CM | POA: Diagnosis not present

## 2013-06-22 DIAGNOSIS — K219 Gastro-esophageal reflux disease without esophagitis: Secondary | ICD-10-CM | POA: Diagnosis not present

## 2013-06-22 DIAGNOSIS — K2270 Barrett's esophagus without dysplasia: Secondary | ICD-10-CM | POA: Diagnosis not present

## 2013-06-22 DIAGNOSIS — Z86010 Personal history of colonic polyps: Secondary | ICD-10-CM | POA: Diagnosis not present

## 2013-06-23 ENCOUNTER — Other Ambulatory Visit: Payer: Self-pay | Admitting: Interventional Cardiology

## 2013-06-23 DIAGNOSIS — I4891 Unspecified atrial fibrillation: Secondary | ICD-10-CM

## 2013-06-23 DIAGNOSIS — Z79899 Other long term (current) drug therapy: Secondary | ICD-10-CM

## 2013-06-30 DIAGNOSIS — N182 Chronic kidney disease, stage 2 (mild): Secondary | ICD-10-CM | POA: Diagnosis not present

## 2013-06-30 DIAGNOSIS — E669 Obesity, unspecified: Secondary | ICD-10-CM | POA: Diagnosis not present

## 2013-06-30 DIAGNOSIS — Z23 Encounter for immunization: Secondary | ICD-10-CM | POA: Diagnosis not present

## 2013-06-30 DIAGNOSIS — E1129 Type 2 diabetes mellitus with other diabetic kidney complication: Secondary | ICD-10-CM | POA: Diagnosis not present

## 2013-06-30 DIAGNOSIS — E781 Pure hyperglyceridemia: Secondary | ICD-10-CM | POA: Diagnosis not present

## 2013-07-07 DIAGNOSIS — K219 Gastro-esophageal reflux disease without esophagitis: Secondary | ICD-10-CM | POA: Diagnosis not present

## 2013-07-07 DIAGNOSIS — E1129 Type 2 diabetes mellitus with other diabetic kidney complication: Secondary | ICD-10-CM | POA: Diagnosis not present

## 2013-07-07 DIAGNOSIS — F4310 Post-traumatic stress disorder, unspecified: Secondary | ICD-10-CM | POA: Diagnosis not present

## 2013-07-07 DIAGNOSIS — Z1331 Encounter for screening for depression: Secondary | ICD-10-CM | POA: Diagnosis not present

## 2013-07-07 DIAGNOSIS — N182 Chronic kidney disease, stage 2 (mild): Secondary | ICD-10-CM | POA: Diagnosis not present

## 2013-07-07 DIAGNOSIS — M25539 Pain in unspecified wrist: Secondary | ICD-10-CM | POA: Diagnosis not present

## 2013-07-17 DIAGNOSIS — H43819 Vitreous degeneration, unspecified eye: Secondary | ICD-10-CM | POA: Diagnosis not present

## 2013-07-17 DIAGNOSIS — Z961 Presence of intraocular lens: Secondary | ICD-10-CM | POA: Diagnosis not present

## 2013-07-17 DIAGNOSIS — H52209 Unspecified astigmatism, unspecified eye: Secondary | ICD-10-CM | POA: Diagnosis not present

## 2013-07-17 DIAGNOSIS — E119 Type 2 diabetes mellitus without complications: Secondary | ICD-10-CM | POA: Diagnosis not present

## 2013-07-19 ENCOUNTER — Other Ambulatory Visit: Payer: Self-pay | Admitting: Interventional Cardiology

## 2013-07-21 DIAGNOSIS — N3942 Incontinence without sensory awareness: Secondary | ICD-10-CM | POA: Diagnosis not present

## 2013-07-21 DIAGNOSIS — C61 Malignant neoplasm of prostate: Secondary | ICD-10-CM | POA: Diagnosis not present

## 2013-08-27 DIAGNOSIS — Z23 Encounter for immunization: Secondary | ICD-10-CM | POA: Diagnosis not present

## 2013-09-29 DIAGNOSIS — E669 Obesity, unspecified: Secondary | ICD-10-CM | POA: Diagnosis not present

## 2013-09-29 DIAGNOSIS — E1129 Type 2 diabetes mellitus with other diabetic kidney complication: Secondary | ICD-10-CM | POA: Diagnosis not present

## 2013-09-29 DIAGNOSIS — N182 Chronic kidney disease, stage 2 (mild): Secondary | ICD-10-CM | POA: Diagnosis not present

## 2013-09-29 DIAGNOSIS — C61 Malignant neoplasm of prostate: Secondary | ICD-10-CM | POA: Diagnosis not present

## 2013-12-16 ENCOUNTER — Other Ambulatory Visit: Payer: Medicare Other

## 2013-12-29 DIAGNOSIS — N182 Chronic kidney disease, stage 2 (mild): Secondary | ICD-10-CM | POA: Diagnosis not present

## 2013-12-29 DIAGNOSIS — Z6833 Body mass index (BMI) 33.0-33.9, adult: Secondary | ICD-10-CM | POA: Diagnosis not present

## 2013-12-29 DIAGNOSIS — E669 Obesity, unspecified: Secondary | ICD-10-CM | POA: Diagnosis not present

## 2013-12-29 DIAGNOSIS — E1129 Type 2 diabetes mellitus with other diabetic kidney complication: Secondary | ICD-10-CM | POA: Diagnosis not present

## 2014-02-02 ENCOUNTER — Ambulatory Visit (INDEPENDENT_AMBULATORY_CARE_PROVIDER_SITE_OTHER): Payer: Medicare Other | Admitting: Interventional Cardiology

## 2014-02-02 ENCOUNTER — Encounter: Payer: Self-pay | Admitting: Interventional Cardiology

## 2014-02-02 VITALS — BP 103/70 | HR 63 | Ht 67.0 in | Wt 216.0 lb

## 2014-02-02 DIAGNOSIS — I480 Paroxysmal atrial fibrillation: Secondary | ICD-10-CM

## 2014-02-02 DIAGNOSIS — I10 Essential (primary) hypertension: Secondary | ICD-10-CM

## 2014-02-02 DIAGNOSIS — E119 Type 2 diabetes mellitus without complications: Secondary | ICD-10-CM | POA: Diagnosis not present

## 2014-02-02 DIAGNOSIS — I2510 Atherosclerotic heart disease of native coronary artery without angina pectoris: Secondary | ICD-10-CM | POA: Diagnosis not present

## 2014-02-02 DIAGNOSIS — I25709 Atherosclerosis of coronary artery bypass graft(s), unspecified, with unspecified angina pectoris: Secondary | ICD-10-CM | POA: Insufficient documentation

## 2014-02-02 DIAGNOSIS — I4891 Unspecified atrial fibrillation: Secondary | ICD-10-CM | POA: Diagnosis not present

## 2014-02-02 DIAGNOSIS — Z7901 Long term (current) use of anticoagulants: Secondary | ICD-10-CM

## 2014-02-02 NOTE — Patient Instructions (Signed)
Your physician recommends that you continue on your current medications as directed. Please refer to the Current Medication list given to you today.  Your physician wants you to follow-up in: 1 year. You will receive a reminder letter in the mail two months in advance. If you don't receive a letter, please call our office to schedule the follow-up appointment.  

## 2014-02-02 NOTE — Progress Notes (Signed)
Patient ID: Walter [name], male   DOB: [birth year], 68 y.o.   MRN: [id number]    [address], [address], [place]  [address] Phone: [phone] Fax:  [phone]  Date:  02/02/2014   ID:  Walter [name], DOB Oct 02, 1946, MRN [id number]  PCP:  Irven Shelling, MD   ASSESSMENT:  1. Coronary artery disease, stable status post prior bypass surgery. No anginal complaints 2. Paroxysmal atrial fibrillation, relatively asymptomatic 3. Chronic anticoagulation therapy without bleeding complications 4. Hypertension, controlled 5. Hyperlipidemia followed by primary care 6. Diabetes mellitus followed by primary care  PLAN:  1. I encouraged him to lose weight, and to exercise aerobically 2. He is encouraged to call if any chest discomfort or other complaints 3. No change in current medical regimen. LDL target of 70 4. Clinical followup in one year   SUBJECTIVE: Walter [name] is a 68 y.o. male who has no CVA complaints. Relatively active. No head trauma or bleeding on anticoagulation. No transient neurological symptoms. She denies medication side effects. He has no symptoms suggestive of claudication. There've been no episodes of syncope, head trauma, or other trauma.   Wt Readings from Last 3 Encounters:  02/02/14 216 lb (97.977 kg)  08/15/11 213 lb (96.616 kg)  06/27/11 205 lb 9.6 oz (93.26 kg)     Past Medical History  Diagnosis Date  . Chronic kidney disease   . Pleural effusion   . Abdominal pain   . Gum disease   . Colon polyp   . Diabetes mellitus   . Cancer     prostate    Current Outpatient Prescriptions  Medication Sig Dispense Refill  . aspirin 81 MG tablet Take 81 mg by mouth daily.        Marland Kitchen escitalopram (LEXAPRO) 20 MG tablet Take 20 mg by mouth daily.        Marland Kitchen glimepiride (AMARYL) 4 MG tablet Take 4 mg by mouth daily before breakfast.        . insulin detemir (LEVEMIR) 100 UNIT/ML injection Inject into the skin at bedtime.      Marland Kitchen JANUMET XR 50-1000 MG TB24        . lisinopril (PRINIVIL,ZESTRIL) 20 MG tablet Take 20 mg by mouth daily.        . Omega-3-acid Ethyl Esters (LOVAZA PO) Take 2 mg by mouth daily.        . rosuvastatin (CRESTOR) 10 MG tablet Take 10 mg by mouth daily.        . TOPROL XL 50 MG 24 hr tablet 1 tab daily      . XARELTO 20 MG TABS tablet TAKE 1 TABLET DAILY  90 tablet  2   No current facility-administered medications for this visit.    Allergies:   No Known Allergies  Social History:  The patient  reports that he has quit smoking. He has never used smokeless tobacco. He reports that he drinks alcohol. He reports that he does not use illicit drugs.   ROS:  Please see the history of present illness.   No medication side effects   All other systems reviewed and negative.   OBJECTIVE: VS:  Ht 5\' 7"  (1.702 m)  Wt 216 lb (97.977 kg)  BMI 33.82 kg/m2 Well nourished, well developed, in no acute distress, compatible with age 89: normal Neck: JVD flat. Carotid bruit absent  Cardiac:  normal S1, S2; RRR; no murmur Lungs:  clear to auscultation bilaterally, no wheezing, rhonchi or rales Abd: soft,  nontender, no hepatomegaly Ext: Edema absent. Pulses 2+ Skin: warm and dry Neuro:  CNs 2-12 intact, no focal abnormalities noted  EKG:  Normal sinus rhythm with nonspecific T wave flattening       Signed, Illene Labrador III, MD 02/02/2014 10:19 AM

## 2014-02-16 DIAGNOSIS — J069 Acute upper respiratory infection, unspecified: Secondary | ICD-10-CM | POA: Diagnosis not present

## 2014-03-10 ENCOUNTER — Other Ambulatory Visit: Payer: Self-pay | Admitting: Interventional Cardiology

## 2014-03-12 DIAGNOSIS — M545 Low back pain, unspecified: Secondary | ICD-10-CM | POA: Diagnosis not present

## 2014-03-13 ENCOUNTER — Other Ambulatory Visit: Payer: Self-pay | Admitting: Interventional Cardiology

## 2014-04-15 DIAGNOSIS — M999 Biomechanical lesion, unspecified: Secondary | ICD-10-CM | POA: Diagnosis not present

## 2014-04-15 DIAGNOSIS — M47817 Spondylosis without myelopathy or radiculopathy, lumbosacral region: Secondary | ICD-10-CM | POA: Diagnosis not present

## 2014-04-15 DIAGNOSIS — M545 Low back pain, unspecified: Secondary | ICD-10-CM | POA: Diagnosis not present

## 2014-04-15 DIAGNOSIS — M25559 Pain in unspecified hip: Secondary | ICD-10-CM | POA: Diagnosis not present

## 2014-04-15 DIAGNOSIS — IMO0001 Reserved for inherently not codable concepts without codable children: Secondary | ICD-10-CM | POA: Diagnosis not present

## 2014-04-15 DIAGNOSIS — S32009A Unspecified fracture of unspecified lumbar vertebra, initial encounter for closed fracture: Secondary | ICD-10-CM | POA: Diagnosis not present

## 2014-04-20 DIAGNOSIS — IMO0001 Reserved for inherently not codable concepts without codable children: Secondary | ICD-10-CM | POA: Diagnosis not present

## 2014-04-20 DIAGNOSIS — M47817 Spondylosis without myelopathy or radiculopathy, lumbosacral region: Secondary | ICD-10-CM | POA: Diagnosis not present

## 2014-04-20 DIAGNOSIS — M999 Biomechanical lesion, unspecified: Secondary | ICD-10-CM | POA: Diagnosis not present

## 2014-04-20 DIAGNOSIS — S32009A Unspecified fracture of unspecified lumbar vertebra, initial encounter for closed fracture: Secondary | ICD-10-CM | POA: Diagnosis not present

## 2014-04-20 DIAGNOSIS — M545 Low back pain, unspecified: Secondary | ICD-10-CM | POA: Diagnosis not present

## 2014-04-20 DIAGNOSIS — M25559 Pain in unspecified hip: Secondary | ICD-10-CM | POA: Diagnosis not present

## 2014-04-28 DIAGNOSIS — M999 Biomechanical lesion, unspecified: Secondary | ICD-10-CM | POA: Diagnosis not present

## 2014-04-28 DIAGNOSIS — M545 Low back pain, unspecified: Secondary | ICD-10-CM | POA: Diagnosis not present

## 2014-04-28 DIAGNOSIS — IMO0001 Reserved for inherently not codable concepts without codable children: Secondary | ICD-10-CM | POA: Diagnosis not present

## 2014-04-28 DIAGNOSIS — S32009A Unspecified fracture of unspecified lumbar vertebra, initial encounter for closed fracture: Secondary | ICD-10-CM | POA: Diagnosis not present

## 2014-04-28 DIAGNOSIS — M25559 Pain in unspecified hip: Secondary | ICD-10-CM | POA: Diagnosis not present

## 2014-04-28 DIAGNOSIS — M47817 Spondylosis without myelopathy or radiculopathy, lumbosacral region: Secondary | ICD-10-CM | POA: Diagnosis not present

## 2014-05-05 DIAGNOSIS — M545 Low back pain, unspecified: Secondary | ICD-10-CM | POA: Diagnosis not present

## 2014-05-05 DIAGNOSIS — S32009A Unspecified fracture of unspecified lumbar vertebra, initial encounter for closed fracture: Secondary | ICD-10-CM | POA: Diagnosis not present

## 2014-05-05 DIAGNOSIS — IMO0001 Reserved for inherently not codable concepts without codable children: Secondary | ICD-10-CM | POA: Diagnosis not present

## 2014-05-05 DIAGNOSIS — M47817 Spondylosis without myelopathy or radiculopathy, lumbosacral region: Secondary | ICD-10-CM | POA: Diagnosis not present

## 2014-05-05 DIAGNOSIS — M25559 Pain in unspecified hip: Secondary | ICD-10-CM | POA: Diagnosis not present

## 2014-05-05 DIAGNOSIS — M999 Biomechanical lesion, unspecified: Secondary | ICD-10-CM | POA: Diagnosis not present

## 2014-05-17 DIAGNOSIS — M25559 Pain in unspecified hip: Secondary | ICD-10-CM | POA: Diagnosis not present

## 2014-05-17 DIAGNOSIS — M47817 Spondylosis without myelopathy or radiculopathy, lumbosacral region: Secondary | ICD-10-CM | POA: Diagnosis not present

## 2014-05-17 DIAGNOSIS — M999 Biomechanical lesion, unspecified: Secondary | ICD-10-CM | POA: Diagnosis not present

## 2014-05-17 DIAGNOSIS — S32009A Unspecified fracture of unspecified lumbar vertebra, initial encounter for closed fracture: Secondary | ICD-10-CM | POA: Diagnosis not present

## 2014-05-17 DIAGNOSIS — M545 Low back pain, unspecified: Secondary | ICD-10-CM | POA: Diagnosis not present

## 2014-05-17 DIAGNOSIS — IMO0001 Reserved for inherently not codable concepts without codable children: Secondary | ICD-10-CM | POA: Diagnosis not present

## 2014-05-24 ENCOUNTER — Other Ambulatory Visit: Payer: Self-pay | Admitting: Interventional Cardiology

## 2014-07-01 DIAGNOSIS — E669 Obesity, unspecified: Secondary | ICD-10-CM | POA: Diagnosis not present

## 2014-07-01 DIAGNOSIS — E1129 Type 2 diabetes mellitus with other diabetic kidney complication: Secondary | ICD-10-CM | POA: Diagnosis not present

## 2014-07-01 DIAGNOSIS — Z6834 Body mass index (BMI) 34.0-34.9, adult: Secondary | ICD-10-CM | POA: Diagnosis not present

## 2014-07-05 DIAGNOSIS — E1165 Type 2 diabetes mellitus with hyperglycemia: Secondary | ICD-10-CM | POA: Diagnosis not present

## 2014-07-05 DIAGNOSIS — E669 Obesity, unspecified: Secondary | ICD-10-CM | POA: Diagnosis not present

## 2014-07-05 DIAGNOSIS — Z23 Encounter for immunization: Secondary | ICD-10-CM | POA: Diagnosis not present

## 2014-07-05 DIAGNOSIS — E1129 Type 2 diabetes mellitus with other diabetic kidney complication: Secondary | ICD-10-CM | POA: Diagnosis not present

## 2014-07-05 DIAGNOSIS — N182 Chronic kidney disease, stage 2 (mild): Secondary | ICD-10-CM | POA: Diagnosis not present

## 2014-07-05 DIAGNOSIS — Z6834 Body mass index (BMI) 34.0-34.9, adult: Secondary | ICD-10-CM | POA: Diagnosis not present

## 2014-07-06 DIAGNOSIS — R634 Abnormal weight loss: Secondary | ICD-10-CM | POA: Diagnosis not present

## 2014-07-06 DIAGNOSIS — K2270 Barrett's esophagus without dysplasia: Secondary | ICD-10-CM | POA: Diagnosis not present

## 2014-07-06 DIAGNOSIS — Z86010 Personal history of colonic polyps: Secondary | ICD-10-CM | POA: Diagnosis not present

## 2014-07-06 DIAGNOSIS — K219 Gastro-esophageal reflux disease without esophagitis: Secondary | ICD-10-CM | POA: Diagnosis not present

## 2014-07-08 DIAGNOSIS — F4310 Post-traumatic stress disorder, unspecified: Secondary | ICD-10-CM | POA: Diagnosis not present

## 2014-07-08 DIAGNOSIS — Z0000 Encounter for general adult medical examination without abnormal findings: Secondary | ICD-10-CM | POA: Diagnosis not present

## 2014-07-08 DIAGNOSIS — Z1389 Encounter for screening for other disorder: Secondary | ICD-10-CM | POA: Diagnosis not present

## 2014-07-15 ENCOUNTER — Telehealth: Payer: Self-pay

## 2014-07-15 NOTE — Telephone Encounter (Signed)
Cardiac clearance given to medical records to fax to [facility] attn: Dr.Buccini

## 2014-07-16 ENCOUNTER — Telehealth: Payer: Self-pay | Admitting: Interventional Cardiology

## 2014-07-16 NOTE — Telephone Encounter (Signed)
Received request from Nurse fax box, documents faxed for surgical clearance. [name] Fax number: [phone] Attention: 10.9.15/km

## 2014-07-31 ENCOUNTER — Other Ambulatory Visit: Payer: Self-pay | Admitting: Interventional Cardiology

## 2014-08-16 ENCOUNTER — Other Ambulatory Visit: Payer: Self-pay | Admitting: Gastroenterology

## 2014-08-16 DIAGNOSIS — K2270 Barrett's esophagus without dysplasia: Secondary | ICD-10-CM | POA: Diagnosis not present

## 2014-08-16 DIAGNOSIS — Z09 Encounter for follow-up examination after completed treatment for conditions other than malignant neoplasm: Secondary | ICD-10-CM | POA: Diagnosis not present

## 2014-08-16 DIAGNOSIS — Z86010 Personal history of colonic polyps: Secondary | ICD-10-CM | POA: Diagnosis not present

## 2014-08-16 DIAGNOSIS — K208 Other esophagitis: Secondary | ICD-10-CM | POA: Diagnosis not present

## 2014-08-16 DIAGNOSIS — K219 Gastro-esophageal reflux disease without esophagitis: Secondary | ICD-10-CM | POA: Diagnosis not present

## 2014-10-06 DIAGNOSIS — N182 Chronic kidney disease, stage 2 (mild): Secondary | ICD-10-CM | POA: Diagnosis not present

## 2014-10-06 DIAGNOSIS — E1122 Type 2 diabetes mellitus with diabetic chronic kidney disease: Secondary | ICD-10-CM | POA: Diagnosis not present

## 2014-10-15 ENCOUNTER — Other Ambulatory Visit: Payer: Self-pay | Admitting: Interventional Cardiology

## 2014-10-18 DIAGNOSIS — N529 Male erectile dysfunction, unspecified: Secondary | ICD-10-CM | POA: Diagnosis not present

## 2014-10-18 DIAGNOSIS — C61 Malignant neoplasm of prostate: Secondary | ICD-10-CM | POA: Diagnosis not present

## 2014-10-29 ENCOUNTER — Other Ambulatory Visit: Payer: Self-pay | Admitting: Interventional Cardiology

## 2014-11-29 ENCOUNTER — Encounter: Payer: Self-pay | Admitting: Interventional Cardiology

## 2014-11-29 ENCOUNTER — Ambulatory Visit (INDEPENDENT_AMBULATORY_CARE_PROVIDER_SITE_OTHER): Payer: Medicare Other | Admitting: Interventional Cardiology

## 2014-11-29 VITALS — BP 98/62 | HR 62 | Ht 67.0 in | Wt 216.6 lb

## 2014-11-29 DIAGNOSIS — Z7901 Long term (current) use of anticoagulants: Secondary | ICD-10-CM | POA: Diagnosis not present

## 2014-11-29 DIAGNOSIS — I480 Paroxysmal atrial fibrillation: Secondary | ICD-10-CM | POA: Diagnosis not present

## 2014-11-29 DIAGNOSIS — I2510 Atherosclerotic heart disease of native coronary artery without angina pectoris: Secondary | ICD-10-CM

## 2014-11-29 DIAGNOSIS — I10 Essential (primary) hypertension: Secondary | ICD-10-CM | POA: Diagnosis not present

## 2014-11-29 MED ORDER — METOPROLOL SUCCINATE ER 50 MG PO TB24: ORAL_TABLET | ORAL | Status: DC

## 2014-11-29 NOTE — Progress Notes (Signed)
Cardiology Office Note   Date:  11/29/2014   ID:  Mearle, Drew 1946-03-22, MRN [id number]  PCP:  Irven Shelling, MD  Cardiologist:   Sinclair Grooms, MD   No chief complaint on file.     History of Present Illness: Walter [name] is a 69 y.o. male who presents for CAD f/u and now having increased atriall fib. Atrial fib is described as irregular pulse, fatigue, and dyspnea. He has had it intermittently since his open-heart surgery many years ago. Over the past month these episodes are becoming increasingly frequent and lasted much longer than typical. He is previously been able to take a nap and when he would awaken, atrial fibrillation would've resolved. More recently episodes can last several hours and one episode lasted a full 24 hours. He has not missed any doses of Xarelto. He denies chest pain.    Past Medical History  Diagnosis Date  . Chronic kidney disease   . Pleural effusion   . Abdominal pain   . Gum disease   . Colon polyp   . Diabetes mellitus   . Cancer     prostate    Past Surgical History  Procedure Laterality Date  . Coronary artery bypass graft    . Colon surgery    . Cataract extraction  2006 and 2007    bilateral  . Colonoscopy       Current Outpatient Prescriptions  Medication Sig Dispense Refill  . aspirin 81 MG tablet Take 81 mg by mouth daily.      Marland Kitchen buPROPion (WELLBUTRIN XL) 150 MG 24 hr tablet Take 150 mg by mouth daily.  0  . escitalopram (LEXAPRO) 20 MG tablet Take 20 mg by mouth daily.      Marland Kitchen HUMALOG KWIKPEN 100 UNIT/ML KiwkPen Inject 100 mLs as directed daily.  1  . insulin detemir (LEVEMIR) 100 UNIT/ML injection Inject into the skin at bedtime.    Marland Kitchen JANUMET XR 50-1000 MG TB24     . lisinopril (PRINIVIL,ZESTRIL) 20 MG tablet TAKE 1 TABLET DAILY 90 tablet 0  . Omega-3-acid Ethyl Esters (LOVAZA PO) Take 2 mg by mouth daily.      Glory Rosebush VERIO test strip     . rosuvastatin (CRESTOR) 10 MG tablet Take 10 mg by mouth daily.       . TOPROL XL 50 MG 24 hr tablet TAKE 1 TABLET DAILY 90 tablet 3  . TOVIAZ 4 MG TB24 tablet Take 4 mg by mouth daily.    Alveda Reasons 20 MG TABS tablet TAKE 1 TABLET DAILY 90 tablet 0   No current facility-administered medications for this visit.    Allergies:   Review of patient's allergies indicates no known allergies.    Social History:  The patient  reports that he has quit smoking. He has never used smokeless tobacco. He reports that he drinks alcohol. He reports that he does not use illicit drugs.   Family History:  The patient's family history includes Angina in his mother; Coronary artery disease in his father; Diabetes in his father.    ROS:  Please see the history of present illness.   Otherwise, review of systems are positive for not sleeping well. Notices increased frequency of atrial fibrillation. Stop drinking alcohol because this seemed to be a correlation..   All other systems are reviewed and negative.    PHYSICAL EXAM: VS:  BP 98/62 mmHg  Pulse 62  Ht 5\' 7"  (1.702 m)  Wt 216  lb 9.6 oz (98.249 kg)  BMI 33.92 kg/m2 , BMI Body mass index is 33.92 kg/(m^2). GEN: Well nourished, well developed, in no acute distress HEENT: normal Neck: no JVD, carotid bruits, or masses Cardiac: RRR; no murmurs, rubs, or gallops,no edema  Respiratory:  clear to auscultation bilaterally, normal work of breathing GI: soft, nontender, nondistended, + BS MS: no deformity or atrophy Skin: warm and dry, no rash Neuro:  Strength and sensation are intact Psych: euthymic mood, full affect   EKG:  EKG is ordered today. The ekg ordered today demonstrates NSR./   Recent Labs: No results found for requested labs within last 365 days.    Lipid Panel No results found for: CHOL, TRIG, HDL, CHOLHDL, VLDL, LDLCALC, LDLDIRECT    Wt Readings from Last 3 Encounters:  11/29/14 216 lb 9.6 oz (98.249 kg)  02/02/14 216 lb (97.977 kg)  08/15/11 213 lb (96.616 kg)      Other studies  Reviewed: Additional studies/ records that were reviewed today include: .    ASSESSMENT AND PLAN:  1.  Paroxysmal atrial fibrillation, symptomatic, longer duration and more frequent over the past month. 2. Coronary artery disease, stable without angina 3. Essential hypertension, controlled 4. Chronic anticoagulation with Xarelto    Current medicines are reviewed at length with the patient today.  The patient has concerns regarding medicines.  The following changes have been made:  Increase metoprolol to 75 mg daily. I advise against combination of aspirin and Xarelto. I advocated discontinuation of alcohol. We discussed the probable need for a sleep study.  Labs/ tests ordered today include: TSH, echo, and 30 day contenuous monitor monitor . No orders of the defined types were placed in this encounter.     Disposition:   FU with Linard Millers in 1 month   Signed, Sinclair Grooms, MD  11/29/2014 4:46 PM    [facility] [place], [place],   [address] Phone: [phone]; Fax: [phone]

## 2014-11-29 NOTE — Patient Instructions (Signed)
Your physician has recommended you make the following change in your medication:  1) INCREASE Metoprolol to 75mg  (1 and 1/2 tablet) daily  Lab Today: Tsh  Your physician has requested that you have an echocardiogram. Echocardiography is a painless test that uses sound waves to create images of your heart. It provides your doctor with information about the size and shape of your heart and how well your heart's chambers and valves are working. This procedure takes approximately one hour. There are no restrictions for this procedure.  Your physician has recommended that you wear an event monitor. Event monitors are medical devices that record the heart's electrical activity. Doctors most often [place] these monitors to diagnose arrhythmias. Arrhythmias are problems with the speed or rhythm of the heartbeat. The monitor is a small, portable device. You can wear one while you do your normal daily activities. This is usually used to diagnose what is causing palpitations/syncope (passing out).  Your physician recommends that you schedule a follow-up appointment in: 1 month

## 2014-11-30 ENCOUNTER — Telehealth: Payer: Self-pay

## 2014-11-30 DIAGNOSIS — K115 Sialolithiasis: Secondary | ICD-10-CM | POA: Diagnosis not present

## 2014-11-30 LAB — TSH: TSH: 2.46 u[IU]/mL (ref 0.35–4.50)

## 2014-11-30 NOTE — Telephone Encounter (Signed)
pt aware of lab results.Thyroid is normal.pt verbalized understanding.

## 2014-11-30 NOTE — Telephone Encounter (Signed)
-----   Message from Sinclair Grooms, MD sent at 11/30/2014 12:42 PM EST ----- Thyroid is normal.

## 2014-12-03 ENCOUNTER — Encounter (INDEPENDENT_AMBULATORY_CARE_PROVIDER_SITE_OTHER): Payer: Medicare Other

## 2014-12-03 ENCOUNTER — Encounter: Payer: Self-pay | Admitting: Radiology

## 2014-12-03 ENCOUNTER — Ambulatory Visit (HOSPITAL_COMMUNITY): Payer: Medicare Other | Attending: Interventional Cardiology | Admitting: Cardiology

## 2014-12-03 DIAGNOSIS — E785 Hyperlipidemia, unspecified: Secondary | ICD-10-CM | POA: Insufficient documentation

## 2014-12-03 DIAGNOSIS — I480 Paroxysmal atrial fibrillation: Secondary | ICD-10-CM | POA: Diagnosis not present

## 2014-12-03 DIAGNOSIS — I10 Essential (primary) hypertension: Secondary | ICD-10-CM | POA: Insufficient documentation

## 2014-12-03 DIAGNOSIS — I2510 Atherosclerotic heart disease of native coronary artery without angina pectoris: Secondary | ICD-10-CM

## 2014-12-03 DIAGNOSIS — E119 Type 2 diabetes mellitus without complications: Secondary | ICD-10-CM | POA: Insufficient documentation

## 2014-12-03 NOTE — Progress Notes (Signed)
Echo performed. 

## 2014-12-03 NOTE — Progress Notes (Signed)
Patient ID: Walter [name], male   DOB: 04-20-1946, 69 y.o.   MRN: [id number] [employer] 30 day monitor applied. EOS 01-02-15

## 2014-12-06 ENCOUNTER — Telehealth: Payer: Self-pay | Admitting: *Deleted

## 2014-12-06 NOTE — Telephone Encounter (Signed)
Received call from [place] in regards to critical reading for pt in A.Flutter with rates between 100-120. Called and spoke with pt and pt states that he is in A.Flutter and is able to feel it. Pt states that he does feel a little tired/foggy but otherwise no issues. Pt states that he has had A.Flutter issues in the past. Informed pt that I would speak with our doctor of the day here in our office and would call him back with any changes. Pt verbalized understanding and was in agreement with this plan.   Spoke with Dr. Harrington Challenger in regards to pt and made her aware of [employer] calling and that pt has known A.Flutter in the past. Dr. Harrington Challenger looked over transmission and had no changes at this time.  Called pt to inform him of no changes. Informed pt that I would route this information to Dr. Tamala Julian and his covering [place] for review, advisement and follow up and that I would put these transmissions in his mailbox so that he can review them while he is in the office tomorrow. Pt verbalized understanding and was in agreement with this plan.

## 2014-12-07 DIAGNOSIS — I639 Cerebral infarction, unspecified: Secondary | ICD-10-CM

## 2014-12-07 HISTORY — DX: Cerebral infarction, unspecified: I63.9

## 2014-12-07 NOTE — Telephone Encounter (Signed)
Noted flutter. Complete monitor.

## 2014-12-10 ENCOUNTER — Telehealth: Payer: Self-pay

## 2014-12-10 NOTE — Telephone Encounter (Signed)
-----   Message from Reid, MD sent at 12/04/2014 12:02 PM EST ----- The echo shows moderate reduction in heart function and moderate atrial enlargement.

## 2014-12-10 NOTE — Telephone Encounter (Signed)
Pt aware.

## 2014-12-24 ENCOUNTER — Other Ambulatory Visit: Payer: Self-pay | Admitting: Interventional Cardiology

## 2014-12-28 ENCOUNTER — Encounter: Payer: Self-pay | Admitting: Interventional Cardiology

## 2014-12-28 ENCOUNTER — Ambulatory Visit (INDEPENDENT_AMBULATORY_CARE_PROVIDER_SITE_OTHER): Payer: Medicare Other | Admitting: Interventional Cardiology

## 2014-12-28 ENCOUNTER — Telehealth: Payer: Self-pay | Admitting: Interventional Cardiology

## 2014-12-28 VITALS — BP 124/70 | HR 59 | Ht 67.0 in | Wt 215.8 lb

## 2014-12-28 DIAGNOSIS — I480 Paroxysmal atrial fibrillation: Secondary | ICD-10-CM | POA: Diagnosis not present

## 2014-12-28 DIAGNOSIS — Z7901 Long term (current) use of anticoagulants: Secondary | ICD-10-CM | POA: Diagnosis not present

## 2014-12-28 DIAGNOSIS — I5022 Chronic systolic (congestive) heart failure: Secondary | ICD-10-CM

## 2014-12-28 DIAGNOSIS — I25119 Atherosclerotic heart disease of native coronary artery with unspecified angina pectoris: Secondary | ICD-10-CM | POA: Diagnosis not present

## 2014-12-28 DIAGNOSIS — I10 Essential (primary) hypertension: Secondary | ICD-10-CM | POA: Diagnosis not present

## 2014-12-28 DIAGNOSIS — K118 Other diseases of salivary glands: Secondary | ICD-10-CM | POA: Diagnosis not present

## 2014-12-28 LAB — CBC WITH DIFFERENTIAL/PLATELET
BASOS PCT: 0.3 % (ref 0.0–3.0)
Basophils Absolute: 0 10*3/uL (ref 0.0–0.1)
EOS PCT: 2.4 % (ref 0.0–5.0)
Eosinophils Absolute: 0.2 10*3/uL (ref 0.0–0.7)
HEMATOCRIT: 38.7 % — AB (ref 39.0–52.0)
Hemoglobin: 13.4 g/dL (ref 13.0–17.0)
LYMPHS ABS: 2.2 10*3/uL (ref 0.7–4.0)
Lymphocytes Relative: 34.9 % (ref 12.0–46.0)
MCHC: 34.7 g/dL (ref 30.0–36.0)
MCV: 86.8 fl (ref 78.0–100.0)
MONO ABS: 0.8 10*3/uL (ref 0.1–1.0)
MONOS PCT: 11.9 % (ref 3.0–12.0)
NEUTROS ABS: 3.2 10*3/uL (ref 1.4–7.7)
Neutrophils Relative %: 50.5 % (ref 43.0–77.0)
PLATELETS: 225 10*3/uL (ref 150.0–400.0)
RBC: 4.45 Mil/uL (ref 4.22–5.81)
RDW: 13.9 % (ref 11.5–15.5)
WBC: 6.4 10*3/uL (ref 4.0–10.5)

## 2014-12-28 LAB — BASIC METABOLIC PANEL
BUN: 16 mg/dL (ref 6–23)
CALCIUM: 9.3 mg/dL (ref 8.4–10.5)
CO2: 29 meq/L (ref 19–32)
CREATININE: 1.31 mg/dL (ref 0.40–1.50)
Chloride: 103 mEq/L (ref 96–112)
GFR: 57.61 mL/min — ABNORMAL LOW (ref >60.00)
Glucose, Bld: 128 mg/dL — ABNORMAL HIGH (ref 70–99)
Potassium: 4.8 mEq/L (ref 3.5–5.1)
Sodium: 137 mEq/L (ref 135–145)

## 2014-12-28 LAB — PROTIME-INR
INR: 1.1 ratio — ABNORMAL HIGH (ref 0.8–1.0)
Prothrombin Time: 11.8 s (ref 9.6–13.1)

## 2014-12-28 MED ORDER — METOPROLOL SUCCINATE ER 100 MG PO TB24: 100.0000 mg | ORAL_TABLET | Freq: Every day | ORAL | Status: DC

## 2014-12-28 NOTE — Progress Notes (Signed)
Cardiology Office Note   Date:  12/28/2014   ID:  Jarrick, Fjeld 1945-10-30, MRN [id number]  PCP:  Irven Shelling, MD  Cardiologist:   Sinclair Grooms, MD   No chief complaint on file.     History of Present Illness: Walter [name] is a 69 y.o. male who presents for  Follow-up of paroxysmal arrhythmia. His 30 day monitor patient will benefit consultation atrial flutter with rapid ventricular). Episodes can last 30 minutes to several hours. Disease episodes he is lethargic, dyspnea, and unable to perform typical activities. There is no associated.  An echocardiogram done is positive the arrhythmia workup demonstrated decreased LVEF of 35-40%. He has no arthritic symptoms. A myocardial perfusion study performed in 2010 demonstrated a inferior wall scar. No evidence of ischemia was noted. There was transient dilatation all of the LV. No ST segment changes were noted. The study was read as low risk. He developed paroxysmal atrial fibrillation during the study. This prevented wall motion analysis.    Past Medical History  Diagnosis Date  . Chronic kidney disease   . Pleural effusion   . Abdominal pain   . Gum disease   . Colon polyp   . Diabetes mellitus   . Cancer     prostate    Past Surgical History  Procedure Laterality Date  . Coronary artery bypass graft    . Colon surgery    . Cataract extraction  2006 and 2007    bilateral  . Colonoscopy       Current Outpatient Prescriptions  Medication Sig Dispense Refill  . aspirin 81 MG tablet Take 81 mg by mouth daily.      Marland Kitchen buPROPion (WELLBUTRIN XL) 150 MG 24 hr tablet Take 150 mg by mouth daily.  0  . escitalopram (LEXAPRO) 20 MG tablet Take 20 mg by mouth daily.      Marland Kitchen HUMALOG KWIKPEN 100 UNIT/ML KiwkPen Inject 14-15 Units as directed daily.   1  . insulin detemir (LEVEMIR) 100 UNIT/ML injection Inject 80 Units into the skin at bedtime.     Marland Kitchen JANUMET XR 50-1000 MG TB24 Take 1 tablet by mouth 2 (two) times daily.      Marland Kitchen lisinopril (PRINIVIL,ZESTRIL) 20 MG tablet TAKE 1 TABLET DAILY (Patient taking differently: TAKE 1 TABLET BY MOUTH DAILY) 90 tablet 0  . metoprolol succinate (TOPROL-XL) 100 MG 24 hr tablet Take 1 tablet (100 mg total) by mouth daily. 90 tablet 3  . Omega-3-acid Ethyl Esters (LOVAZA PO) Take 2 mg by mouth daily.      Glory Rosebush VERIO test strip     . rosuvastatin (CRESTOR) 10 MG tablet Take 10 mg by mouth daily.      . TOVIAZ 4 MG TB24 tablet Take 4 mg by mouth daily.    Marland Kitchen VIAGRA 100 MG tablet Take 1 tablet by mouth daily as needed. ED    . XARELTO 20 MG TABS tablet TAKE 1 TABLET DAILY (Patient taking differently: TAKE 1 TABLET BY MOUTH DAILY) 90 tablet 1   No current facility-administered medications for this visit.    Allergies:   Review of patient's allergies indicates no known allergies.    Social History:  The patient  reports that he has quit smoking. He has never used smokeless tobacco. He reports that he drinks alcohol. He reports that he does not use illicit drugs.   Family History:  The patient's family history includes Angina in his mother; Coronary artery disease in his  father; Diabetes in his father.    ROS:  Please see the history of present illness.   Otherwise, review of systems are positive for  Anxiety concerning his heart situation.   All other systems are reviewed and negative.    PHYSICAL EXAM: VS:  BP 124/70 mmHg  Pulse 59  Ht 5\' 7"  (1.702 m)  Wt 215 lb 12.8 oz (97.886 kg)  BMI 33.79 kg/m2 , BMI Body mass index is 33.79 kg/(m^2). GEN: Well nourished, well developed, in no acute distress HEENT: normal Neck: no JVD, carotid bruits, or masses Cardiac: RRR; no murmurs, rubs, or gallops,no edema  Respiratory:  clear to auscultation bilaterally, normal work of breathing GI: soft, nontender, nondistended, + BS MS: no deformity or atrophy Skin: warm and dry, no rash Neuro:  Strength and sensation are intact Psych: euthymic mood, full affect   EKG:  EKG is  not ordered today.    Recent Labs: 11/29/2014: TSH 2.46    Lipid Panel No results found for: CHOL, TRIG, HDL, CHOLHDL, VLDL, LDLCALC, LDLDIRECT    Wt Readings from Last 3 Encounters:  12/28/14 215 lb 12.8 oz (97.886 kg)  11/29/14 216 lb 9.6 oz (98.249 kg)  02/02/14 216 lb (97.977 kg)      Other studies Reviewed: Additional studies/ records that were reviewed today include:   [facility] records. Review of the above records demonstrates:  A myocardial perfusion study from 2010 Lozol 1 above.   ASSESSMENT AND PLAN:  Chronic systolic heart failure : Asymptomatic  Coronary artery disease involving native coronary artery of native heart with angina pectoris : asymptomatic : The procedure including potential risks of stroke, death, myocardial infarction, all monitors 6 his bleeding were discussed in detail with the patient and wife. We will likely do the procedure from the left radial approach.  Paroxysmal atrial fibrillation : Frequent episodes based upon 30 day monitoring  Essential hypertension : controlled  Chronic anticoagulation : Xarelto. This medication will be discontinued 48 hours prior to angiography.     Current medicines are reviewed at length with the patient today.  The patient does not have concerns regarding medicines.  The following changes have been made:   Debilitated and obtained, the patient will need to have coronary angiography with bypass graft in to exclude bypass closure as a cause for decreased LV function. We'll also need to initiate  Anti- arrhythmic therapy for a considerable.   This will be Tikosyn or sotalol.  Labs/ tests ordered today include:  Coronary angiography.  Orders Placed This Encounter  Procedures  . Basic metabolic panel  . CBC w/Diff  . INR/PT     Disposition:   FU with Linard Millers in 3 weeks   Signed, Sinclair Grooms, MD  12/28/2014 9:18 AM    [facility] [place], [place], [place]   [address] Phone: [phone]; Fax: [phone]

## 2014-12-28 NOTE — Telephone Encounter (Signed)
Spoke with [facility] the only way they accept release forms is to mail them. ROI mailed to  [employer] [place] [address] 3.22.16/km

## 2014-12-28 NOTE — Patient Instructions (Signed)
Your physician has recommended you make the following change in your medication:  1) INCREASE Metoprolol to 100mg  daily. An Rx has been sent to your pharmacy  Lab Today: Bmet, Cbc, Pt/Inr   Your physician has requested that you have a cardiac catheterization. Cardiac catheterization is used to diagnose and/or treat various heart conditions. Doctors may recommend this procedure for a number of different reasons. The most common reason is to evaluate chest pain. Chest pain can be a symptom of coronary artery disease (CAD), and cardiac catheterization can show whether plaque is narrowing or blocking your heart's arteries. This procedure is also used to evaluate the valves, as well as measure the blood flow and oxygen levels in different parts of your heart. For further information please visit [email or web address]. Please follow instruction sheet, as given.

## 2014-12-29 DIAGNOSIS — K115 Sialolithiasis: Secondary | ICD-10-CM | POA: Diagnosis not present

## 2014-12-30 ENCOUNTER — Telehealth: Payer: Self-pay

## 2014-12-30 ENCOUNTER — Encounter (HOSPITAL_COMMUNITY): Payer: Self-pay | Admitting: Radiology

## 2014-12-30 ENCOUNTER — Emergency Department (HOSPITAL_COMMUNITY): Payer: Medicare Other

## 2014-12-30 ENCOUNTER — Other Ambulatory Visit: Payer: Self-pay

## 2014-12-30 ENCOUNTER — Inpatient Hospital Stay (HOSPITAL_COMMUNITY)
Admission: EM | Admit: 2014-12-30 | Discharge: 2014-12-31 | DRG: 065 | Disposition: A | Discharge status: Home / Self Care | Payer: Medicare Other | Attending: Internal Medicine | Admitting: Internal Medicine

## 2014-12-30 DIAGNOSIS — Z951 Presence of aortocoronary bypass graft: Secondary | ICD-10-CM

## 2014-12-30 DIAGNOSIS — Z87891 Personal history of nicotine dependence: Secondary | ICD-10-CM | POA: Diagnosis not present

## 2014-12-30 DIAGNOSIS — I6340 Cerebral infarction due to embolism of unspecified cerebral artery: Secondary | ICD-10-CM | POA: Diagnosis not present

## 2014-12-30 DIAGNOSIS — I6789 Other cerebrovascular disease: Secondary | ICD-10-CM | POA: Diagnosis not present

## 2014-12-30 DIAGNOSIS — I10 Essential (primary) hypertension: Secondary | ICD-10-CM | POA: Diagnosis not present

## 2014-12-30 DIAGNOSIS — R471 Dysarthria and anarthria: Secondary | ICD-10-CM | POA: Diagnosis present

## 2014-12-30 DIAGNOSIS — E785 Hyperlipidemia, unspecified: Secondary | ICD-10-CM | POA: Diagnosis present

## 2014-12-30 DIAGNOSIS — G459 Transient cerebral ischemic attack, unspecified: Secondary | ICD-10-CM | POA: Diagnosis not present

## 2014-12-30 DIAGNOSIS — E119 Type 2 diabetes mellitus without complications: Secondary | ICD-10-CM | POA: Diagnosis not present

## 2014-12-30 DIAGNOSIS — I639 Cerebral infarction, unspecified: Secondary | ICD-10-CM | POA: Diagnosis present

## 2014-12-30 DIAGNOSIS — Z9842 Cataract extraction status, left eye: Secondary | ICD-10-CM | POA: Diagnosis not present

## 2014-12-30 DIAGNOSIS — I2510 Atherosclerotic heart disease of native coronary artery without angina pectoris: Secondary | ICD-10-CM | POA: Diagnosis not present

## 2014-12-30 DIAGNOSIS — E118 Type 2 diabetes mellitus with unspecified complications: Secondary | ICD-10-CM | POA: Diagnosis not present

## 2014-12-30 DIAGNOSIS — Z86010 Personal history of colonic polyps: Secondary | ICD-10-CM | POA: Diagnosis not present

## 2014-12-30 DIAGNOSIS — R531 Weakness: Secondary | ICD-10-CM | POA: Diagnosis not present

## 2014-12-30 DIAGNOSIS — I129 Hypertensive chronic kidney disease with stage 1 through stage 4 chronic kidney disease, or unspecified chronic kidney disease: Secondary | ICD-10-CM | POA: Diagnosis present

## 2014-12-30 DIAGNOSIS — I5022 Chronic systolic (congestive) heart failure: Secondary | ICD-10-CM | POA: Diagnosis not present

## 2014-12-30 DIAGNOSIS — Z7901 Long term (current) use of anticoagulants: Secondary | ICD-10-CM | POA: Diagnosis not present

## 2014-12-30 DIAGNOSIS — R9431 Abnormal electrocardiogram [ECG] [EKG]: Secondary | ICD-10-CM | POA: Diagnosis not present

## 2014-12-30 DIAGNOSIS — Z9841 Cataract extraction status, right eye: Secondary | ICD-10-CM | POA: Diagnosis not present

## 2014-12-30 DIAGNOSIS — I480 Paroxysmal atrial fibrillation: Secondary | ICD-10-CM | POA: Diagnosis present

## 2014-12-30 DIAGNOSIS — Z7982 Long term (current) use of aspirin: Secondary | ICD-10-CM | POA: Diagnosis not present

## 2014-12-30 DIAGNOSIS — N189 Chronic kidney disease, unspecified: Secondary | ICD-10-CM | POA: Diagnosis not present

## 2014-12-30 DIAGNOSIS — I25709 Atherosclerosis of coronary artery bypass graft(s), unspecified, with unspecified angina pectoris: Secondary | ICD-10-CM | POA: Diagnosis present

## 2014-12-30 DIAGNOSIS — R4781 Slurred speech: Secondary | ICD-10-CM | POA: Diagnosis not present

## 2014-12-30 LAB — COMPREHENSIVE METABOLIC PANEL
ALBUMIN: 3.6 g/dL (ref 3.5–5.2)
ALK PHOS: 35 U/L — AB (ref 39–117)
ALT: 17 U/L (ref 0–53)
AST: 19 U/L (ref 0–37)
Anion gap: 10 (ref 5–15)
BUN: 17 mg/dL (ref 6–23)
CO2: 27 mmol/L (ref 19–32)
Calcium: 9.6 mg/dL (ref 8.4–10.5)
Chloride: 99 mmol/L (ref 96–112)
Creatinine, Ser: 1.32 mg/dL (ref 0.50–1.35)
GFR calc Af Amer: 62 mL/min — ABNORMAL LOW (ref >90)
GFR calc non Af Amer: 53 mL/min — ABNORMAL LOW (ref >90)
Glucose, Bld: 167 mg/dL — ABNORMAL HIGH (ref 70–99)
POTASSIUM: 4.4 mmol/L (ref 3.5–5.1)
Sodium: 136 mmol/L (ref 135–145)
Total Bilirubin: 1.5 mg/dL — ABNORMAL HIGH (ref 0.3–1.2)
Total Protein: 6.7 g/dL (ref 6.0–8.3)

## 2014-12-30 LAB — URINALYSIS, ROUTINE W REFLEX MICROSCOPIC
BILIRUBIN URINE: NEGATIVE
GLUCOSE, UA: NEGATIVE mg/dL
HGB URINE DIPSTICK: NEGATIVE
Ketones, ur: NEGATIVE mg/dL
Leukocytes, UA: NEGATIVE
Nitrite: NEGATIVE
PH: 5 (ref 5.0–8.0)
Protein, ur: NEGATIVE mg/dL
Specific Gravity, Urine: 1.015 (ref 1.005–1.030)
Urobilinogen, UA: 0.2 mg/dL (ref 0.0–1.0)

## 2014-12-30 LAB — APTT: aPTT: 28 seconds (ref 24–37)

## 2014-12-30 LAB — DIFFERENTIAL
BASOS ABS: 0 10*3/uL (ref 0.0–0.1)
Basophils Relative: 1 % (ref 0–1)
Eosinophils Absolute: 0.1 10*3/uL (ref 0.0–0.7)
Eosinophils Relative: 2 % (ref 0–5)
LYMPHS PCT: 41 % (ref 12–46)
Lymphs Abs: 2.6 10*3/uL (ref 0.7–4.0)
MONO ABS: 0.7 10*3/uL (ref 0.1–1.0)
MONOS PCT: 11 % (ref 3–12)
Neutro Abs: 3 10*3/uL (ref 1.7–7.7)
Neutrophils Relative %: 45 % (ref 43–77)

## 2014-12-30 LAB — RAPID URINE DRUG SCREEN, HOSP PERFORMED
Amphetamines: NOT DETECTED
Barbiturates: NOT DETECTED
Benzodiazepines: NOT DETECTED
Cocaine: NOT DETECTED
Opiates: NOT DETECTED
Tetrahydrocannabinol: POSITIVE — AB

## 2014-12-30 LAB — CBG MONITORING, ED: Glucose-Capillary: 155 mg/dL — ABNORMAL HIGH (ref 70–99)

## 2014-12-30 LAB — I-STAT TROPONIN, ED: Troponin i, poc: 0 ng/mL (ref 0.00–0.08)

## 2014-12-30 LAB — CBC
HCT: 37.4 % — ABNORMAL LOW (ref 39.0–52.0)
HEMOGLOBIN: 13.1 g/dL (ref 13.0–17.0)
MCH: 29.8 pg (ref 26.0–34.0)
MCHC: 35 g/dL (ref 30.0–36.0)
MCV: 85 fL (ref 78.0–100.0)
Platelets: 211 10*3/uL (ref 150–400)
RBC: 4.4 MIL/uL (ref 4.22–5.81)
RDW: 13.4 % (ref 11.5–15.5)
WBC: 6.4 10*3/uL (ref 4.0–10.5)

## 2014-12-30 LAB — PROTIME-INR
INR: 1.07 (ref 0.00–1.49)
Prothrombin Time: 14 seconds (ref 11.6–15.2)

## 2014-12-30 LAB — ETHANOL

## 2014-12-30 MED ORDER — ASPIRIN 81 MG PO CHEW
324.0000 mg | CHEWABLE_TABLET | Freq: Once | ORAL | Status: AC
  Administered 2014-12-30: 324 mg via ORAL
  Filled 2014-12-30: qty 4

## 2014-12-30 MED ORDER — ASPIRIN EC 81 MG PO TBEC
81.0000 mg | DELAYED_RELEASE_TABLET | Freq: Every day | ORAL | Status: DC
  Administered 2014-12-31: 81 mg via ORAL
  Filled 2014-12-30: qty 1

## 2014-12-30 MED ORDER — IOHEXOL 350 MG/ML SOLN
80.0000 mL | Freq: Once | INTRAVENOUS | Status: AC | PRN
  Administered 2014-12-30: 75 mL via INTRAVENOUS

## 2014-12-30 NOTE — ED Notes (Signed)
Pt now able to articulate words better and states that he doesn't feel as "woozy"

## 2014-12-30 NOTE — Telephone Encounter (Signed)
-----   Message from Belva Crome, MD sent at 12/28/2014  5:19 PM EDT ----- Mild abnormality of kidney function compared to prior studies.

## 2014-12-30 NOTE — ED Notes (Addendum)
Pt arrived in CT with this RN

## 2014-12-30 NOTE — H&P (Signed)
Michaeljohn Biss is an 69 y.o. male.    Lavone Orn (pcp)   Chief Complaint:  Dysarthria, right hand clumsy HPI: 69 yo male with dm2, hypertension apparently had c/o dysarthria and clumsy right hand starting at about 7pm.  Symptoms lasted for a couple of hours . Pt denies headache, chest pain, palp, sob, n/v, diarrhea, brbpr, black stool.  Pt presented to ED for evaluation and seen by neurology,  Symptoms were too mild for TPA per ED.  Pt noted to be marijuana positive on uds however ,pt states that his symptoms didn't correlate with use.   Past Medical History  Diagnosis Date  . Chronic kidney disease   . Pleural effusion   . Abdominal pain   . Gum disease   . Colon polyp   . Diabetes mellitus   . Cancer     prostate    Past Surgical History  Procedure Laterality Date  . Coronary artery bypass graft    . Colon surgery    . Cataract extraction  2006 and 2007    bilateral  . Colonoscopy      Family History  Problem Relation Age of Onset  . Angina Mother   . Diabetes Father   . Coronary artery disease Father    Social History:  reports that he has quit smoking. He has never used smokeless tobacco. He reports that he drinks alcohol. His drug history is not on file.  Allergies: No Known Allergies Medications reviewed  (Not in a hospital admission)  Results for orders placed or performed during the hospital encounter of 12/30/14 (from the past 48 hour(s))  Ethanol     Status: None   Collection Time: 12/30/14  7:35 PM  Result Value Ref Range   Alcohol, Ethyl (B) <5 0 - 9 mg/dL    Comment:        LOWEST DETECTABLE LIMIT FOR SERUM ALCOHOL IS 11 mg/dL FOR MEDICAL PURPOSES ONLY   Protime-INR     Status: None   Collection Time: 12/30/14  7:36 PM  Result Value Ref Range   Prothrombin Time 14.0 11.6 - 15.2 seconds   INR 1.07 0.00 - 1.49  APTT     Status: None   Collection Time: 12/30/14  7:36 PM  Result Value Ref Range   aPTT 28 24 - 37 seconds  CBC     Status: Abnormal    Collection Time: 12/30/14  7:36 PM  Result Value Ref Range   WBC 6.4 4.0 - 10.5 K/uL   RBC 4.40 4.22 - 5.81 MIL/uL   Hemoglobin 13.1 13.0 - 17.0 g/dL   HCT 37.4 (L) 39.0 - 52.0 %   MCV 85.0 78.0 - 100.0 fL   MCH 29.8 26.0 - 34.0 pg   MCHC 35.0 30.0 - 36.0 g/dL   RDW 13.4 11.5 - 15.5 %   Platelets 211 150 - 400 K/uL  Differential     Status: None   Collection Time: 12/30/14  7:36 PM  Result Value Ref Range   Neutrophils Relative % 45 43 - 77 %   Neutro Abs 3.0 1.7 - 7.7 K/uL   Lymphocytes Relative 41 12 - 46 %   Lymphs Abs 2.6 0.7 - 4.0 K/uL   Monocytes Relative 11 3 - 12 %   Monocytes Absolute 0.7 0.1 - 1.0 K/uL   Eosinophils Relative 2 0 - 5 %   Eosinophils Absolute 0.1 0.0 - 0.7 K/uL   Basophils Relative 1 0 - 1 %  Basophils Absolute 0.0 0.0 - 0.1 K/uL  Comprehensive metabolic panel     Status: Abnormal   Collection Time: 12/30/14  7:36 PM  Result Value Ref Range   Sodium 136 135 - 145 mmol/L   Potassium 4.4 3.5 - 5.1 mmol/L   Chloride 99 96 - 112 mmol/L   CO2 27 19 - 32 mmol/L   Glucose, Bld 167 (H) 70 - 99 mg/dL   BUN 17 6 - 23 mg/dL   Creatinine, Ser 1.32 0.50 - 1.35 mg/dL   Calcium 9.6 8.4 - 10.5 mg/dL   Total Protein 6.7 6.0 - 8.3 g/dL   Albumin 3.6 3.5 - 5.2 g/dL   AST 19 0 - 37 U/L   ALT 17 0 - 53 U/L   Alkaline Phosphatase 35 (L) 39 - 117 U/L   Total Bilirubin 1.5 (H) 0.3 - 1.2 mg/dL   GFR calc non Af Amer 53 (L) >90 mL/min   GFR calc Af Amer 62 (L) >90 mL/min    Comment: (NOTE) The eGFR has been calculated using the CKD EPI equation. This calculation has not been validated in all clinical situations. eGFR's persistently <90 mL/min signify possible Chronic Kidney Disease.    Anion gap 10 5 - 15  I-Stat Troponin, ED (not at [facility])     Status: None   Collection Time: 12/30/14  7:58 PM  Result Value Ref Range   Troponin i, poc 0.00 0.00 - 0.08 ng/mL   Comment 3            Comment: Due to the release kinetics of cTnI, a negative result within the first  hours of the onset of symptoms does not rule out myocardial infarction with certainty. If myocardial infarction is still suspected, repeat the test at appropriate intervals.   CBG monitoring, ED     Status: Abnormal   Collection Time: 12/30/14  8:11 PM  Result Value Ref Range   Glucose-Capillary 155 (H) 70 - 99 mg/dL  Urine Drug Screen     Status: Abnormal   Collection Time: 12/30/14  9:25 PM  Result Value Ref Range   Opiates NONE DETECTED NONE DETECTED   Cocaine NONE DETECTED NONE DETECTED   Benzodiazepines NONE DETECTED NONE DETECTED   Amphetamines NONE DETECTED NONE DETECTED   Tetrahydrocannabinol POSITIVE (A) NONE DETECTED   Barbiturates NONE DETECTED NONE DETECTED    Comment:        DRUG SCREEN FOR MEDICAL PURPOSES ONLY.  IF CONFIRMATION IS NEEDED FOR ANY PURPOSE, NOTIFY LAB WITHIN 5 DAYS.        LOWEST DETECTABLE LIMITS FOR URINE DRUG SCREEN Drug Class       Cutoff (ng/mL) Amphetamine      1000 Barbiturate      200 Benzodiazepine   423 Tricyclics       953 Opiates          300 Cocaine          300 THC              50   Urinalysis, Routine w reflex microscopic     Status: None   Collection Time: 12/30/14  9:25 PM  Result Value Ref Range   Color, Urine YELLOW YELLOW   APPearance CLEAR CLEAR   Specific Gravity, Urine 1.015 1.005 - 1.030   pH 5.0 5.0 - 8.0   Glucose, UA NEGATIVE NEGATIVE mg/dL   Hgb urine dipstick NEGATIVE NEGATIVE   Bilirubin Urine NEGATIVE NEGATIVE   Ketones, ur NEGATIVE NEGATIVE  mg/dL   Protein, ur NEGATIVE NEGATIVE mg/dL   Urobilinogen, UA 0.2 0.0 - 1.0 mg/dL   Nitrite NEGATIVE NEGATIVE   Leukocytes, UA NEGATIVE NEGATIVE    Comment: MICROSCOPIC NOT DONE ON URINES WITH NEGATIVE PROTEIN, BLOOD, LEUKOCYTES, NITRITE, OR GLUCOSE <1000 mg/dL.   Ct Angio Head W/cm &/or Wo Cm  12/30/2014   CLINICAL DATA:  Sudden onset of RIGHT-sided weakness and slurred speech. Stroke risk factors include diabetes and chronic renal disease. Initial encounter.   EXAM: CT ANGIOGRAPHY HEAD AND NECK  TECHNIQUE: Multidetector CT imaging of the head and neck was performed using the standard protocol during bolus administration of intravenous contrast. Multiplanar CT image reconstructions and MIPs were obtained to evaluate the vascular anatomy. Carotid stenosis measurements (when applicable) are obtained utilizing NASCET criteria, using the distal internal carotid diameter as the denominator.  CONTRAST:  22m OMNIPAQUE IOHEXOL 350 MG/ML SOLN  COMPARISON:  CT head earlier today.  FINDINGS: CT HEAD  Calvarium and skull base: No fracture or destructive lesion. Mastoids and middle ears are grossly clear.  Paranasal sinuses: Imaged portions are clear.  Orbits: Negative.  Brain: No evidence of acute hemorrhage, hydrocephalus, or mass lesion. Normal for age cerebral volume without significant confluent white matter disease.  CTA NECK  Aortic arch: Bovine trunk. Imaged portion shows no evidence of aneurysm or dissection. No significant stenosis of the major arch vessel origins.  Right carotid system: No evidence of dissection, stenosis (50% or greater) or occlusion.  Left carotid system: No evidence of dissection, stenosis (50% or greater) or occlusion.  Vertebral arteries: RIGHT dominant. No evidence of dissection, stenosis (50% or greater) or occlusion.  Soft tissues: Normal thyroid. Mild vascular congestion at the lung apices and moderate mediastinal fat. Previous CABG. Cervical spondylosis with multilevel disc space narrowing and reversal of the normal cervical lordotic curve.  CTA HEAD  Anterior circulation: There is relative slow flow in a LEFT frontoparietal MCA cortical branch as seen on image 146 series 401. There may be subtle early cytotoxic edema in an adjacent gyrus. No significant stenosis, proximal occlusion, aneurysm, or vascular malformation.  Posterior circulation: No significant stenosis, proximal occlusion, aneurysm, or vascular malformation.  Venous sinuses: As  permitted by contrast timing, patent.  Anatomic variants: None of significance.  Delayed phase:   No abnormal intracranial enhancement.  IMPRESSION: No large vessel occlusion or significant proximal stenosis.  Relative slow flow in a LEFT posterior frontoparietal MCA cortical branch could be indicative of a regional area of distal infarction. If no contraindications, consider MRI for further evaluation.  Findings discussed with ordering provider.   Electronically Signed   By: JRolla FlattenM.D.   On: 12/30/2014 21:17   Ct Head Wo Contrast  12/30/2014   CLINICAL DATA:  Code stroke. Acute onset of right-sided weakness, facial droop and slurred speech. Initial encounter.  EXAM: CT HEAD WITHOUT CONTRAST  TECHNIQUE: Contiguous axial images were obtained from the base of the skull through the vertex without intravenous contrast.  COMPARISON:  None.  FINDINGS: There is no evidence of acute infarction, mass lesion, or intra- or extra-axial hemorrhage on CT.  Prominence of the ventricles and sulci reflects mild cortical volume loss. Mild cerebellar atrophy is noted. Mild periventricular and subcortical white matter change likely reflects small vessel ischemic microangiopathy.  The brainstem and fourth ventricle are within normal limits. The basal ganglia are unremarkable in appearance. The cerebral hemispheres demonstrate grossly normal gray-white differentiation. No mass effect or midline shift is seen.  There is no  evidence of fracture; visualized osseous structures are unremarkable in appearance. The orbits are within normal limits. The paranasal sinuses and mastoid air cells are well-aerated. No significant soft tissue abnormalities are seen.  IMPRESSION: 1. No acute intracranial pathology seen on CT. 2. Mild cortical volume loss and scattered small vessel ischemic microangiopathy.  These results were called by telephone at the time of interpretation on 12/30/2014 at 7:49 pm to Dr. Jeanell Sparrow, who verbally acknowledged these  results.   Electronically Signed   By: Garald Balding M.D.   On: 12/30/2014 19:44   Ct Angio Neck W/cm &/or Wo/cm  12/30/2014   CLINICAL DATA:  Sudden onset of RIGHT-sided weakness and slurred speech. Stroke risk factors include diabetes and chronic renal disease. Initial encounter.  EXAM: CT ANGIOGRAPHY HEAD AND NECK  TECHNIQUE: Multidetector CT imaging of the head and neck was performed using the standard protocol during bolus administration of intravenous contrast. Multiplanar CT image reconstructions and MIPs were obtained to evaluate the vascular anatomy. Carotid stenosis measurements (when applicable) are obtained utilizing NASCET criteria, using the distal internal carotid diameter as the denominator.  CONTRAST:  76m OMNIPAQUE IOHEXOL 350 MG/ML SOLN  COMPARISON:  CT head earlier today.  FINDINGS: CT HEAD  Calvarium and skull base: No fracture or destructive lesion. Mastoids and middle ears are grossly clear.  Paranasal sinuses: Imaged portions are clear.  Orbits: Negative.  Brain: No evidence of acute hemorrhage, hydrocephalus, or mass lesion. Normal for age cerebral volume without significant confluent white matter disease.  CTA NECK  Aortic arch: Bovine trunk. Imaged portion shows no evidence of aneurysm or dissection. No significant stenosis of the major arch vessel origins.  Right carotid system: No evidence of dissection, stenosis (50% or greater) or occlusion.  Left carotid system: No evidence of dissection, stenosis (50% or greater) or occlusion.  Vertebral arteries: RIGHT dominant. No evidence of dissection, stenosis (50% or greater) or occlusion.  Soft tissues: Normal thyroid. Mild vascular congestion at the lung apices and moderate mediastinal fat. Previous CABG. Cervical spondylosis with multilevel disc space narrowing and reversal of the normal cervical lordotic curve.  CTA HEAD  Anterior circulation: There is relative slow flow in a LEFT frontoparietal MCA cortical branch as seen on image 146  series 401. There may be subtle early cytotoxic edema in an adjacent gyrus. No significant stenosis, proximal occlusion, aneurysm, or vascular malformation.  Posterior circulation: No significant stenosis, proximal occlusion, aneurysm, or vascular malformation.  Venous sinuses: As permitted by contrast timing, patent.  Anatomic variants: None of significance.  Delayed phase:   No abnormal intracranial enhancement.  IMPRESSION: No large vessel occlusion or significant proximal stenosis.  Relative slow flow in a LEFT posterior frontoparietal MCA cortical branch could be indicative of a regional area of distal infarction. If no contraindications, consider MRI for further evaluation.  Findings discussed with ordering provider.   Electronically Signed   By: JRolla FlattenM.D.   On: 12/30/2014 21:17    Review of Systems  Constitutional: Negative.   HENT: Negative.   Eyes: Negative.   Respiratory: Negative.   Cardiovascular: Negative.   Gastrointestinal: Negative.   Genitourinary: Negative.   Musculoskeletal: Negative.   Skin: Negative.   Neurological: Positive for focal weakness.  Endo/Heme/Allergies: Negative.   Psychiatric/Behavioral: Negative.     Blood pressure 129/66, pulse 57, temperature 98.6 F (37 C), temperature source Oral, resp. rate 11, SpO2 97 %. Physical Exam  Constitutional: He is oriented to person, place, and time. He appears well-developed and well-nourished.  HENT:  Head: Normocephalic and atraumatic.  Eyes: Conjunctivae and EOM are normal. Pupils are equal, round, and reactive to light. No scleral icterus.  Neck: Normal range of motion. Neck supple. No JVD present. No tracheal deviation present. No thyromegaly present.  Cardiovascular: Normal rate and regular rhythm.  Exam reveals no gallop and no friction rub.   No murmur heard. Respiratory: Effort normal and breath sounds normal. No respiratory distress. He has no wheezes. He has no rales.  GI: Soft. Bowel sounds are  normal. He exhibits no distension. There is no tenderness. There is no rebound and no guarding.  Musculoskeletal: Normal range of motion. He exhibits no edema or tenderness.  Lymphadenopathy:    He has no cervical adenopathy.  Neurological: He is alert and oriented to person, place, and time. He has normal reflexes. He displays normal reflexes. No cranial nerve deficit. He exhibits normal muscle tone. Coordination normal.  Skin: Skin is warm and dry. No rash noted. No erythema. No pallor.  Psychiatric: He has a normal mood and affect. His behavior is normal. Judgment and thought content normal.     Assessment/Plan Dysarthria, R clumsy hand , TIA r/o CVA Cont xarelto, cont crestor, pt received aspirin this evening Check esr, ana, rpr, b12, tsh,  Check carotid ultrasound, check cardiac echo Check MRI brain without contrast Appreciate neurology input  Dm2 fsbs ac and qhs, iss   Pafib (CHADS2=4-5) Cont xarelto     [name] 12/30/2014, 11:23 PM

## 2014-12-30 NOTE — ED Notes (Signed)
Dr Katherine Roan in room

## 2014-12-30 NOTE — ED Notes (Signed)
Per EMS, pt LSN [id number], pt began having slurred speech and difficulty making words and right side deficit. Pt noticed that he began to have difficulty using his right hand.  Pt's slurred speech became worse in en route. Pt supposed to be in the cath lab tomorrow morning. Pt AxO x 4 on arrival.

## 2014-12-30 NOTE — ED Provider Notes (Signed)
Patient arrived via ems with report of onset of slurred speech at 35 with lkn same time.  Presented as code stroke via ems and met at bridge by Dr. Leonel Ramsay and on to [place].  I saw in CT while Dr. Leonel Ramsay doing exam.  Airway clear- further evaluation pending scan.   Pattricia Boss, MD 12/30/14 [phone]

## 2014-12-30 NOTE — ED Notes (Signed)
Pt back in room from CT 

## 2014-12-30 NOTE — ED Provider Notes (Signed)
CSN: [id number]     Arrival date & time 12/30/14  1933 History   First MD Initiated Contact with Patient 12/30/14 1937     Chief Complaint  Patient presents with  . Code Stroke     (Consider location/radiation/quality/duration/timing/severity/associated sxs/prior Treatment) HPI 69 y.o. Male with complaints of onset of slurred speech and right handed weakness began at 68.  BS reported 160 by ems, no history of seizures.  Patient on xarelto for paroxysmal a fib.  Past Medical History  Diagnosis Date  . Chronic kidney disease   . Pleural effusion   . Abdominal pain   . Gum disease   . Colon polyp   . Diabetes mellitus   . Cancer     prostate   Past Surgical History  Procedure Laterality Date  . Coronary artery bypass graft    . Colon surgery    . Cataract extraction  2006 and 2007    bilateral  . Colonoscopy     Family History  Problem Relation Age of Onset  . Angina Mother   . Diabetes Father   . Coronary artery disease Father    History  Substance Use Topics  . Smoking status: Former Research scientist (life sciences)  . Smokeless tobacco: Never Used  . Alcohol Use: Yes     Comment: social    Review of Systems  All other systems reviewed and are negative.     Allergies  Review of patient's allergies indicates no known allergies.  Home Medications   Prior to Admission medications   Medication Sig Start Date End Date Taking? Authorizing Provider  aspirin 81 MG tablet Take 81 mg by mouth daily.      Historical Provider, MD  buPROPion (WELLBUTRIN XL) 150 MG 24 hr tablet Take 150 mg by mouth daily. 10/29/14   Historical Provider, MD  escitalopram (LEXAPRO) 20 MG tablet Take 20 mg by mouth daily.      Historical Provider, MD  HUMALOG KWIKPEN 100 UNIT/ML KiwkPen Inject 14-15 Units as directed daily.  10/06/14   Historical Provider, MD  insulin detemir (LEVEMIR) 100 UNIT/ML injection Inject 80 Units into the skin at bedtime.     Historical Provider, MD  JANUMET XR 50-1000 MG TB24 Take 1  tablet by mouth 2 (two) times daily.  07/12/11   Historical Provider, MD  lisinopril (PRINIVIL,ZESTRIL) 20 MG tablet TAKE 1 TABLET DAILY Patient taking differently: TAKE 1 TABLET BY MOUTH DAILY 10/15/14   Belva Crome, MD  metoprolol succinate (TOPROL-XL) 100 MG 24 hr tablet Take 1 tablet (100 mg total) by mouth daily. 12/28/14   Belva Crome, MD  Omega-3-acid Ethyl Esters (LOVAZA PO) Take 1 tablet by mouth 2 (two) times daily.     Historical Provider, MD  [facility] VERIO test strip  11/27/14   Historical Provider, MD  rosuvastatin (CRESTOR) 10 MG tablet Take 10 mg by mouth daily.      Historical Provider, MD  TOVIAZ 4 MG TB24 tablet Take 4 mg by mouth daily. 11/12/14   Historical Provider, MD  VIAGRA 100 MG tablet Take 1 tablet by mouth daily as needed for erectile dysfunction. ED 12/03/14   Historical Provider, MD  XARELTO 20 MG TABS tablet TAKE 1 TABLET DAILY Patient taking differently: TAKE 1 TABLET BY MOUTH DAILY 12/24/14   Belva Crome, MD   BP 124/61 mmHg  Pulse 65  Temp(Src) 98.6 F (37 C) (Oral)  Resp 12  SpO2 97% Physical Exam  Constitutional: He is oriented to person, place,  and time. He appears well-developed and well-nourished.  HENT:  Head: Normocephalic.  Eyes: EOM are normal.  Neck: Normal range of motion.  Cardiovascular: Normal rate.   Pulmonary/Chest: Effort normal.  Musculoskeletal: Normal range of motion.  Neurological: He is alert and oriented to person, place, and time. He displays normal reflexes. No cranial nerve deficit.  Mild right hand weakness  Skin: Skin is warm and dry.  Nursing note and vitals reviewed.   ED Course  Procedures (including critical care time) Labs Review Labs Reviewed  ETHANOL  PROTIME-INR  APTT  CBC  DIFFERENTIAL  COMPREHENSIVE METABOLIC PANEL  URINE RAPID DRUG SCREEN (HOSP PERFORMED)  URINALYSIS, ROUTINE W REFLEX MICROSCOPIC  I-STAT CHEM 8, ED  I-STAT TROPOININ, ED    Imaging Review No results found.   EKG  Interpretation   Date/Time:  Thursday December 30 2014 19:51:15 EDT Ventricular Rate:  61 PR Interval:  161 QRS Duration: 94 QT Interval:  438 QTC Calculation: 441 R Axis:   -17 Text Interpretation:  Sinus rhythm Borderline left axis deviation  Confirmed by Noriel Guthrie MD, Andee Poles [phone]) on 12/30/2014 7:57:37 PM      MDM   Final diagnoses:  Stroke    69 y.o male with onset of stroke symptoms just pta.  Seen by Dr. Leonel Ramsay and no indication for tpa at this time-although he will follow closely and consider tpa if symptoms worsen.  Full neurologic exam deferred to neurology as he has been present from door through visit.   Dr.Kirkpatrick plans observation in ed for two hours and will admit to hospitalist at that time if not worse.   Discussed with Dr.Kim and he will admit.   Pattricia Boss, MD 12/30/14 [phone]

## 2014-12-30 NOTE — ED Notes (Signed)
ASA not given due to worsening dysarthria

## 2014-12-30 NOTE — Consult Note (Signed)
Neurology Consultation Reason for Consult: Stroke Referring Physician: Jeanell Sparrow, D  CC: Stroke  History is obtained from:patient  HPI: Deionte Spivack is a 69 y.o. male with a history of afib who is off of anticoagulation for a catheterization tomorrow who presents with sudden onset right sided weakness and mild aphasia. He last took Xarelto 3/22 at 11pm. He feels that his symptoms may be improving slightly, but he does still have deficits. He also is having mild difficulty with speaking.    LKW: 7 pm tpa given?: no, mild symptoms, rivaroxaban within 48 hours.  NIHSS: 3 for dysarthria, aphasia, and facial droop.    ROS: A 14 point ROS was performed and is negative except as noted in the HPI.   Past Medical History  Diagnosis Date  . Chronic kidney disease   . Pleural effusion   . Abdominal pain   . Gum disease   . Colon polyp   . Diabetes mellitus   . Cancer     prostate    Family History: stroke  Social History: Tob: uses vaporizer.   Exam: Current vital signs: BP 124/61 mmHg  Pulse 65  Temp(Src) 98.6 F (37 C) (Oral)  Resp 12  SpO2 97% Vital signs in last 24 hours: Temp:  [98.6 F (37 C)] 98.6 F (37 C) (03/24 1947) Pulse Rate:  [65] 65 (03/24 1947) Resp:  [12] 12 (03/24 1947) BP: (124)/(61) 124/61 mmHg (03/24 1947) SpO2:  [97 %] 97 % (03/24 1947) Weight:  [97.5 kg (214 lb 15.2 oz)] 97.5 kg (214 lb 15.2 oz) (03/24 1900)  Physical Exam  Constitutional: Appears well-developed and well-nourished.  Psych: Affect appropriate to situation Eyes: No scleral injection HENT: No OP obstrucion Head: Normocephalic.  Cardiovascular: Normal rate and regular rhythm.  Respiratory: Effort normal and breath sounds normal to anterior ascultation GI: Soft.  No distension. There is no tenderness.  Skin: WDI  Neuro: Mental Status: Patient is awake, alert, oriented to person, place, month, year, and situation. Patient is able to give a clear and coherent history. No signs of  neglect. He has a mild expressive aphasia.  Mild dysarthria.  Cranial Nerves: II: Visual Fields are full. Pupils are equal, round, and reactive to light.   III,IV, VI: EOMI without ptosis or diploplia.  V: Facial sensation is symmetric to pin VII: Facial movement is notable for right droop.  VIII: hearing is intact to voice X: Uvula elevates symmetrically XI: Shoulder shrug is symmetric. XII: tongue is midline without atrophy or fasciculations.  Motor: Tone is normal. Bulk is normal. No drift. He has mild impairment of fine motor movements in his right hand, mild right leg weakness without drift.  Sensory: Sensation is symmetric to light touch and temperature in the arms and legs. Deep Tendon Reflexes: 2+ and symmetric in the biceps and patellae.  Plantars: Toes are downgoing bilaterally.  Cerebellar: FNF  intact bilaterally    I have reviewed labs in epic and the results pertinent to this consultation are: cmp - mildly eleavted glucose, t. 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 I suspect this is embolic secondary to his atrial fibrillation. Given his mild symptoms and recent Xarelto use, I would not favor TPA at this time.  Recommendations: 1. HgbA1c, fasting lipid panel 2. MRI of the brain without contrast 3. Frequent neuro checks 4. Echocardiogram 5. Carotid dopplers are not needed given CTA.  6.  Prophylactic therapy-Antiplatelet med: Aspirin - dose 325mg  PO or 300mg  PR 7. Risk factor modification 8. Telemetry monitoring 9. PT consult, OT consult, Speech consult    Roland Rack, MD [facility] [phone]  If 7pm- 7am, please page neurology on call as listed in [place].

## 2014-12-30 NOTE — ED Notes (Signed)
Pt CBG, 155. Nurse was notified.

## 2014-12-30 NOTE — ED Notes (Signed)
Dr Katherine Roan ordering ct angio, ct notified and going to CT 1

## 2014-12-30 NOTE — Telephone Encounter (Signed)
Pt aware of lab results.Mild abnormality of kidney function compared to prior studies.

## 2014-12-30 NOTE — ED Notes (Signed)
Dr Katherine Roan ordered pt to go ahead with ASA since slurred speech/dyarthria has improved. Pt passed stroke swallow screen prior to dysarthria episode earlier.

## 2014-12-30 NOTE — ED Notes (Signed)
Pt states "I feel woozy" and slurred speech and dysarthria worsening. Dr Katherine Roan notified and is coming back to the see the patient.

## 2014-12-31 ENCOUNTER — Ambulatory Visit (HOSPITAL_COMMUNITY)
Admission: RE | Admit: 2014-12-31 | Payer: Medicare Other | Source: Ambulatory Visit | Admitting: Interventional Cardiology

## 2014-12-31 ENCOUNTER — Inpatient Hospital Stay (HOSPITAL_COMMUNITY): Payer: Medicare Other

## 2014-12-31 ENCOUNTER — Encounter (HOSPITAL_COMMUNITY)
Admission: EM | Disposition: A | Discharge status: Home / Self Care | Payer: Self-pay | Source: Home / Self Care | Attending: Internal Medicine

## 2014-12-31 DIAGNOSIS — Z7982 Long term (current) use of aspirin: Secondary | ICD-10-CM | POA: Diagnosis not present

## 2014-12-31 DIAGNOSIS — Z9841 Cataract extraction status, right eye: Secondary | ICD-10-CM | POA: Diagnosis not present

## 2014-12-31 DIAGNOSIS — I639 Cerebral infarction, unspecified: Secondary | ICD-10-CM | POA: Diagnosis present

## 2014-12-31 DIAGNOSIS — Z86010 Personal history of colonic polyps: Secondary | ICD-10-CM | POA: Diagnosis not present

## 2014-12-31 DIAGNOSIS — I129 Hypertensive chronic kidney disease with stage 1 through stage 4 chronic kidney disease, or unspecified chronic kidney disease: Secondary | ICD-10-CM | POA: Diagnosis present

## 2014-12-31 DIAGNOSIS — I5022 Chronic systolic (congestive) heart failure: Secondary | ICD-10-CM | POA: Diagnosis present

## 2014-12-31 DIAGNOSIS — I10 Essential (primary) hypertension: Secondary | ICD-10-CM | POA: Diagnosis not present

## 2014-12-31 DIAGNOSIS — Z7901 Long term (current) use of anticoagulants: Secondary | ICD-10-CM

## 2014-12-31 DIAGNOSIS — I480 Paroxysmal atrial fibrillation: Secondary | ICD-10-CM

## 2014-12-31 DIAGNOSIS — Z87891 Personal history of nicotine dependence: Secondary | ICD-10-CM | POA: Diagnosis not present

## 2014-12-31 DIAGNOSIS — I25708 Atherosclerosis of coronary artery bypass graft(s), unspecified, with other forms of angina pectoris: Secondary | ICD-10-CM | POA: Diagnosis not present

## 2014-12-31 DIAGNOSIS — R531 Weakness: Secondary | ICD-10-CM | POA: Diagnosis not present

## 2014-12-31 DIAGNOSIS — E119 Type 2 diabetes mellitus without complications: Secondary | ICD-10-CM | POA: Diagnosis present

## 2014-12-31 DIAGNOSIS — I6340 Cerebral infarction due to embolism of unspecified cerebral artery: Secondary | ICD-10-CM | POA: Diagnosis present

## 2014-12-31 DIAGNOSIS — Z951 Presence of aortocoronary bypass graft: Secondary | ICD-10-CM | POA: Diagnosis not present

## 2014-12-31 DIAGNOSIS — I2510 Atherosclerotic heart disease of native coronary artery without angina pectoris: Secondary | ICD-10-CM | POA: Diagnosis present

## 2014-12-31 DIAGNOSIS — E1159 Type 2 diabetes mellitus with other circulatory complications: Secondary | ICD-10-CM

## 2014-12-31 DIAGNOSIS — R471 Dysarthria and anarthria: Secondary | ICD-10-CM | POA: Diagnosis not present

## 2014-12-31 DIAGNOSIS — N189 Chronic kidney disease, unspecified: Secondary | ICD-10-CM | POA: Diagnosis present

## 2014-12-31 DIAGNOSIS — Z9842 Cataract extraction status, left eye: Secondary | ICD-10-CM | POA: Diagnosis not present

## 2014-12-31 DIAGNOSIS — I25719 Atherosclerosis of autologous vein coronary artery bypass graft(s) with unspecified angina pectoris: Secondary | ICD-10-CM | POA: Diagnosis not present

## 2014-12-31 DIAGNOSIS — E785 Hyperlipidemia, unspecified: Secondary | ICD-10-CM | POA: Diagnosis present

## 2014-12-31 LAB — GLUCOSE, CAPILLARY
GLUCOSE-CAPILLARY: 146 mg/dL — AB (ref 70–99)
Glucose-Capillary: 226 mg/dL — ABNORMAL HIGH (ref 70–99)
Glucose-Capillary: 189 mg/dL — ABNORMAL HIGH (ref 70–99)
Glucose-Capillary: 239 mg/dL — ABNORMAL HIGH (ref 70–99)

## 2014-12-31 LAB — POCT I-STAT, CHEM 8
BUN: 23 mg/dL (ref 6–23)
CALCIUM ION: 1.18 mmol/L (ref 1.13–1.30)
CHLORIDE: 101 mmol/L (ref 96–112)
Creatinine, Ser: 1.3 mg/dL (ref 0.50–1.35)
GLUCOSE: 166 mg/dL — AB (ref 70–99)
HCT: 40 % (ref 39.0–52.0)
Hemoglobin: 13.6 g/dL (ref 13.0–17.0)
Potassium: 4.6 mmol/L (ref 3.5–5.1)
Sodium: 139 mmol/L (ref 135–145)
TCO2: 25 mmol/L (ref 0–100)

## 2014-12-31 LAB — CBC
HCT: 35.2 % — ABNORMAL LOW (ref 39.0–52.0)
HEMOGLOBIN: 12.3 g/dL — AB (ref 13.0–17.0)
MCH: 29.9 pg (ref 26.0–34.0)
MCHC: 34.9 g/dL (ref 30.0–36.0)
MCV: 85.4 fL (ref 78.0–100.0)
PLATELETS: 200 10*3/uL (ref 150–400)
RBC: 4.12 MIL/uL — ABNORMAL LOW (ref 4.22–5.81)
RDW: 13.5 % (ref 11.5–15.5)
WBC: 4.6 10*3/uL (ref 4.0–10.5)

## 2014-12-31 LAB — BASIC METABOLIC PANEL
ANION GAP: 6 (ref 5–15)
BUN: 17 mg/dL (ref 6–23)
CHLORIDE: 104 mmol/L (ref 96–112)
CO2: 29 mmol/L (ref 19–32)
Calcium: 9 mg/dL (ref 8.4–10.5)
Creatinine, Ser: 1.15 mg/dL (ref 0.50–1.35)
GFR calc Af Amer: 73 mL/min — ABNORMAL LOW (ref >90)
GFR calc non Af Amer: 63 mL/min — ABNORMAL LOW (ref >90)
Glucose, Bld: 175 mg/dL — ABNORMAL HIGH (ref 70–99)
Potassium: 4.1 mmol/L (ref 3.5–5.1)
SODIUM: 139 mmol/L (ref 135–145)

## 2014-12-31 LAB — LIPID PANEL
Cholesterol: 121 mg/dL (ref 0–200)
HDL: 35 mg/dL — AB (ref >39)
LDL CALC: 44 mg/dL (ref 0–99)
TRIGLYCERIDES: 208 mg/dL — AB (ref <150)
Total CHOL/HDL Ratio: 3.5 RATIO
VLDL: 42 mg/dL — ABNORMAL HIGH (ref 0–40)

## 2014-12-31 LAB — POCT I-STAT TROPONIN I: Troponin i, poc: 0 ng/mL (ref 0.00–0.08)

## 2014-12-31 LAB — PROTIME-INR
INR: 1.05 (ref 0.00–1.49)
Prothrombin Time: 13.8 seconds (ref 11.6–15.2)

## 2014-12-31 LAB — RPR: RPR: NONREACTIVE

## 2014-12-31 LAB — SEDIMENTATION RATE: SED RATE: 16 mm/h (ref 0–16)

## 2014-12-31 LAB — VITAMIN B12: VITAMIN B 12: 893 pg/mL (ref 211–911)

## 2014-12-31 SURGERY — LEFT HEART CATHETERIZATION WITH CORONARY/GRAFT ANGIOGRAM

## 2014-12-31 MED ORDER — SODIUM CHLORIDE 0.9 % IV SOLN
INTRAVENOUS | Status: AC
  Administered 2014-12-31: 01:00:00 via INTRAVENOUS

## 2014-12-31 MED ORDER — ESCITALOPRAM OXALATE 10 MG PO TABS
20.0000 mg | ORAL_TABLET | Freq: Every day | ORAL | Status: DC
  Administered 2014-12-31: 20 mg via ORAL
  Filled 2014-12-31: qty 2

## 2014-12-31 MED ORDER — STROKE: EARLY STAGES OF RECOVERY BOOK
Freq: Once | Status: AC
  Administered 2014-12-31: 01:00:00

## 2014-12-31 MED ORDER — SODIUM CHLORIDE 0.9 % IJ SOLN
3.0000 mL | Freq: Two times a day (BID) | INTRAMUSCULAR | Status: DC
  Administered 2014-12-31: 3 mL via INTRAVENOUS

## 2014-12-31 MED ORDER — LISINOPRIL 20 MG PO TABS
20.0000 mg | ORAL_TABLET | Freq: Every day | ORAL | Status: DC
  Administered 2014-12-31: 20 mg via ORAL
  Filled 2014-12-31: qty 1

## 2014-12-31 MED ORDER — FESOTERODINE FUMARATE ER 4 MG PO TB24
4.0000 mg | ORAL_TABLET | Freq: Every day | ORAL | Status: DC
  Administered 2014-12-31: 4 mg via ORAL
  Filled 2014-12-31: qty 1

## 2014-12-31 MED ORDER — AMIODARONE HCL 200 MG PO TABS
400.0000 mg | ORAL_TABLET | Freq: Two times a day (BID) | ORAL | Status: DC
  Administered 2014-12-31: 400 mg via ORAL
  Filled 2014-12-31: qty 2

## 2014-12-31 MED ORDER — RIVAROXABAN 20 MG PO TABS: 20.0000 mg | ORAL_TABLET | Freq: Every day | ORAL | Status: DC

## 2014-12-31 MED ORDER — INSULIN ASPART 100 UNIT/ML ~~LOC~~ SOLN
0.0000 [IU] | Freq: Three times a day (TID) | SUBCUTANEOUS | Status: DC
  Administered 2014-12-31: 2 [IU] via SUBCUTANEOUS
  Administered 2014-12-31 (×2): 3 [IU] via SUBCUTANEOUS

## 2014-12-31 MED ORDER — ROSUVASTATIN CALCIUM 10 MG PO TABS
10.0000 mg | ORAL_TABLET | Freq: Every day | ORAL | Status: DC
  Administered 2014-12-31: 10 mg via ORAL
  Filled 2014-12-31 (×2): qty 1

## 2014-12-31 MED ORDER — INSULIN ASPART 100 UNIT/ML ~~LOC~~ SOLN: 0.0000 [IU] | Freq: Every day | SUBCUTANEOUS | Status: DC

## 2014-12-31 MED ORDER — ACETAMINOPHEN 650 MG RE SUPP: 650.0000 mg | Freq: Four times a day (QID) | RECTAL | Status: DC | PRN

## 2014-12-31 MED ORDER — BUPROPION HCL ER (XL) 150 MG PO TB24
150.0000 mg | ORAL_TABLET | Freq: Every day | ORAL | Status: DC
  Administered 2014-12-31: 150 mg via ORAL
  Filled 2014-12-31: qty 1

## 2014-12-31 MED ORDER — METOPROLOL SUCCINATE ER 100 MG PO TB24
100.0000 mg | ORAL_TABLET | Freq: Every day | ORAL | Status: DC
  Administered 2014-12-31: 100 mg via ORAL
  Filled 2014-12-31: qty 1

## 2014-12-31 MED ORDER — AMIODARONE HCL 200 MG PO TABS: 200.0000 mg | ORAL_TABLET | Freq: Two times a day (BID) | ORAL | Status: DC

## 2014-12-31 MED ORDER — METOPROLOL SUCCINATE ER 50 MG PO TB24: 50.0000 mg | ORAL_TABLET | Freq: Every day | ORAL | Status: DC

## 2014-12-31 MED ORDER — RIVAROXABAN 20 MG PO TABS
20.0000 mg | ORAL_TABLET | Freq: Every day | ORAL | Status: DC
  Filled 2014-12-31 (×2): qty 1

## 2014-12-31 MED ORDER — LORAZEPAM 0.5 MG PO TABS
0.5000 mg | ORAL_TABLET | Freq: Once | ORAL | Status: AC
  Administered 2014-12-31: 0.5 mg via ORAL
  Filled 2014-12-31: qty 1

## 2014-12-31 MED ORDER — AMOXICILLIN-POT CLAVULANATE 500-125 MG PO TABS
1.0000 | ORAL_TABLET | Freq: Two times a day (BID) | ORAL | Status: DC
  Administered 2014-12-31 (×2): 500 mg via ORAL
  Filled 2014-12-31 (×4): qty 1

## 2014-12-31 MED ORDER — ACETAMINOPHEN 325 MG PO TABS: 650.0000 mg | ORAL_TABLET | Freq: Four times a day (QID) | ORAL | Status: DC | PRN

## 2014-12-31 MED ORDER — ASPIRIN 81 MG PO TABS: 81.0000 mg | ORAL_TABLET | Freq: Every day | ORAL | Status: DC

## 2014-12-31 MED ORDER — OMEGA-3-ACID ETHYL ESTERS 1 G PO CAPS
1000.0000 mg | ORAL_CAPSULE | Freq: Two times a day (BID) | ORAL | Status: DC
  Administered 2014-12-31: 1000 mg via ORAL
  Filled 2014-12-31: qty 1

## 2014-12-31 NOTE — Progress Notes (Signed)
Patient discharged home with wife. RN discussed discharge instructions and medications with patient and wife. Patient states he understands medications and discharge instructions. Prescriptions and AVS given to patient. Patient taken home by wife.

## 2014-12-31 NOTE — Discharge Summary (Signed)
Physician Discharge Summary  Walter [name] RUE:[id number] DOB: 1946-02-22 DOA: 12/30/2014  PCP: Irven Shelling, MD  Admit date: 12/30/2014 Discharge date: 12/31/2014  Time spent: 65 minutes  Recommendations for Outpatient Follow-up:  1. Follow-up with Dr. Daneen Schick of cardiology in 2 weeks. 2. Follow-up with Dr. Leonie Man of neurology in 2 months. 3. Follow-up with PCP in 1 week.  Discharge Diagnoses:  Principal Problem:   Stroke Active Problems:   CAD (coronary artery disease), autologous vein bypass graft   Type II diabetes mellitus   Paroxysmal atrial fibrillation   Essential hypertension   Chronic anticoagulation   Chronic systolic heart failure   Dysarthria   TIA (transient ischemic attack)   Discharge Condition: Stable and improved  Diet recommendation: Heart healthy  Filed Weights   12/31/14 0031 12/31/14 0357  Weight: 97.523 kg (215 lb) 98.158 kg (216 lb 6.4 oz)    History of present illness:  Per Dr. [name] 69 yo male with dm2, hypertension apparently had c/o dysarthria and clumsy right hand starting at about 7pm the night of admission. Symptoms lasted for a couple of hours . Pt denied headache, chest pain, palp, sob, n/v, diarrhea, brbpr, black stool. Pt presented to ED for evaluation and seen by neurology, Symptoms were too mild for TPA per ED. Pt noted to be marijuana positive on uds however ,pt states that his symptoms didn't correlate with use.  Hospital Course:  #1 acute/subacute cortical infarcts involving left frontal operculum anteriorly sextant the left precentral gyrus felt to be embolic secondary to atrial fibrillation off anticoagulation Patient with clinical improvement. MRI of the head consistent with acute/subacute cortical infarcts which likely occurred when patient was off of his anticoagulation in preparation for cardiac catheterization today. Patient with recent 2-D echo and a such will not repeat. Patient had a CT angiogram of the head and  neck with no acute abnormalities. Fasting lipid panel with LDL of 44. Hemoglobin A1c was pending. Recommended to continue patient back on his home regimen of Xarelto and will need to follow-up with neurology as outpatient.  #2 hypertension Remained stable. Patient was maintained on his home regimen of lisinopril and metoprolol.  #3 long-standing paroxysmal atrial fibrillation Patient had been on Xarelto for greater than 12 months and Xarelto was held for 48 hours in preparation for cardiac catheterization today however patient was admitted with right arm weakness and dysarthria with MRI evidence of acute stroke. Xarelto has been resumed. Will likely need to delay cardiac catheterization for about 4-6 weeks. Patient has been started on amiodarone and will be discharged home on 200 mg twice daily per cardiology recommendations and will decrease patient's metoprolol to 50 mg daily for rate control. Xarelto will be resumed for anticoagulation. Outpatient follow-up with cardiology in 2 weeks.  #4 diabetes mellitus Remained stable. Patient was maintained on a sliding scale insulin.  #5 hyperlipidemia Well controlled. LDL of 44. Continue home regimen of Crestor and lovaza.  #6 coronary artery disease status post bypass Stable. Patient asymptomatic. Continue cardiac medications. Per neurology patient may have cardiac catheterization about 4-6 weeks and may need to be bridged with heparin around cardiac catheterization time. Outpatient follow-up.  The rest of patient's chronic medical issues remained stable throughout the hospitalization and patient be discharged in stable and improved condition.   Procedures:  MRI head 12/31/2014  CT angiogram head and neck 12/30/2014  CT head 12/30/2014  Consultations:  Neurology: Dr. Leonel Ramsay 12/30/2014  Cardiology: Dr. Tamala Julian 12/31/2014  Discharge Exam: [name] Vitals:  12/31/14 1803  BP: 119/68  Pulse: 57  Temp: 97.5 F (36.4 C)  Resp: 18     General: NAD Cardiovascular: Irregularly irregular Respiratory: CTAB  Discharge Instructions   Discharge Instructions    Ambulatory referral to Neurology    Complete by:  As directed   Dr. Leonie Man requests follow up for this patient in 2 months.     Diet - low sodium heart healthy    Complete by:  As directed      Discharge instructions    Complete by:  As directed   Follow up with Dr Tamala Julian cardiology in 2 weeks. Follow up with Dr Leonie Man, neurology in 2 months.     Increase activity slowly    Complete by:  As directed           Discharge Medication List as of 12/31/2014  5:32 PM    START taking these medications   Details  amiodarone (PACERONE) 200 MG tablet Take 1 tablet (200 mg total) by mouth 2 (two) times daily., Starting 12/31/2014, Until Discontinued, Print      CONTINUE these medications which have CHANGED   Details  metoprolol succinate (TOPROL-XL) 50 MG 24 hr tablet Take 1 tablet (50 mg total) by mouth daily., Starting 12/31/2014, Until Discontinued, Print      CONTINUE these medications which have NOT CHANGED   Details  amoxicillin-clavulanate (AUGMENTIN) 500-125 MG per tablet Take 1 tablet by mouth 2 (two) times daily. STARTED 12/29/14, for 10 days ENDING 01/08/15, Starting 12/29/2014, Until Discontinued, Historical Med    buPROPion (WELLBUTRIN XL) 150 MG 24 hr tablet Take 150 mg by mouth daily., Starting 10/29/2014, Until Discontinued, Historical Med    escitalopram (LEXAPRO) 20 MG tablet Take 20 mg by mouth daily.  , Until Discontinued, Historical Med    HUMALOG KWIKPEN 100 UNIT/ML KiwkPen Inject 14-15 Units as directed daily. , Starting 10/06/2014, Until Discontinued, Historical Med    insulin detemir (LEVEMIR) 100 UNIT/ML injection Inject 80 Units into the skin at bedtime. , Until Discontinued, Historical Med    lisinopril (PRINIVIL,ZESTRIL) 20 MG tablet TAKE 1 TABLET DAILY, Normal    !! LOVAZA 1 G capsule Take 1,000 mg by mouth 2 (two) times daily. ,  Starting 12/19/2014, Until Discontinued, Historical Med    !! Omega-3-acid Ethyl Esters (LOVAZA PO) Take 1 tablet by mouth 2 (two) times daily. , Until Discontinued, Historical Med    rosuvastatin (CRESTOR) 10 MG tablet Take 10 mg by mouth at bedtime. , Until Discontinued, Historical Med    TOVIAZ 4 MG TB24 tablet Take 4 mg by mouth daily., Starting 11/12/2014, Until Discontinued, Historical Med    VIAGRA 100 MG tablet Take 1 tablet by mouth daily as needed for erectile dysfunction. ED, Starting 12/03/2014, Until Discontinued, Historical Med    JANUMET XR 50-1000 MG TB24 Take 1 tablet by mouth 2 (two) times daily. , Starting 07/12/2011, Until Discontinued, Historical Med    XARELTO 20 MG TABS tablet TAKE 1 TABLET DAILY, Normal     !! - Potential duplicate medications found. Please discuss with provider.    STOP taking these medications     aspirin 81 MG tablet        No Known Allergies Follow-up Information    Follow up with Sinclair Grooms, MD. Schedule an appointment as soon as possible for a visit in 2 weeks.   Specialty:  Cardiology   Contact information:   [address] [place] [address] [phone]  Follow up with Irven Shelling, MD. Schedule an appointment as soon as possible for a visit in 1 week.   Specialty:  Internal Medicine   Contact information:   [address] [place] [address] [phone]       Follow up with SETHI,PRAMOD, MD In 2 months.   Specialties:  Neurology, Radiology   Contact information:   [address] [place] [address] [phone]        The results of significant diagnostics from this hospitalization (including imaging, microbiology, ancillary and laboratory) are listed below for reference.    Significant Diagnostic Studies: Ct Angio Head W/cm &/or Wo Cm  12/30/2014   CLINICAL DATA:  Sudden onset of RIGHT-sided weakness and slurred speech. Stroke risk factors include  diabetes and chronic renal disease. Initial encounter.  EXAM: CT ANGIOGRAPHY HEAD AND NECK  TECHNIQUE: Multidetector CT imaging of the head and neck was performed using the standard protocol during bolus administration of intravenous contrast. Multiplanar CT image reconstructions and MIPs were obtained to evaluate the vascular anatomy. Carotid stenosis measurements (when applicable) are obtained utilizing NASCET criteria, using the distal internal carotid diameter as the denominator.  CONTRAST:  3mL OMNIPAQUE IOHEXOL 350 MG/ML SOLN  COMPARISON:  CT head earlier today.  FINDINGS: CT HEAD  Calvarium and skull base: No fracture or destructive lesion. Mastoids and middle ears are grossly clear.  Paranasal sinuses: Imaged portions are clear.  Orbits: Negative.  Brain: No evidence of acute hemorrhage, hydrocephalus, or mass lesion. Normal for age cerebral volume without significant confluent white matter disease.  CTA NECK  Aortic arch: Bovine trunk. Imaged portion shows no evidence of aneurysm or dissection. No significant stenosis of the major arch vessel origins.  Right carotid system: No evidence of dissection, stenosis (50% or greater) or occlusion.  Left carotid system: No evidence of dissection, stenosis (50% or greater) or occlusion.  Vertebral arteries: RIGHT dominant. No evidence of dissection, stenosis (50% or greater) or occlusion.  Soft tissues: Normal thyroid. Mild vascular congestion at the lung apices and moderate mediastinal fat. Previous CABG. Cervical spondylosis with multilevel disc space narrowing and reversal of the normal cervical lordotic curve.  CTA HEAD  Anterior circulation: There is relative slow flow in a LEFT frontoparietal MCA cortical branch as seen on image 146 series 401. There may be subtle early cytotoxic edema in an adjacent gyrus. No significant stenosis, proximal occlusion, aneurysm, or vascular malformation.  Posterior circulation: No significant stenosis, proximal occlusion,  aneurysm, or vascular malformation.  Venous sinuses: As permitted by contrast timing, patent.  Anatomic variants: None of significance.  Delayed phase:   No abnormal intracranial enhancement.  IMPRESSION: No large vessel occlusion or significant proximal stenosis.  Relative slow flow in a LEFT posterior frontoparietal MCA cortical branch could be indicative of a regional area of distal infarction. If no contraindications, consider MRI for further evaluation.  Findings discussed with ordering provider.   Electronically Signed   By: Rolla Flatten M.D.   On: 12/30/2014 21:17   Ct Head Wo Contrast  12/30/2014   CLINICAL DATA:  Code stroke. Acute onset of right-sided weakness, facial droop and slurred speech. Initial encounter.  EXAM: CT HEAD WITHOUT CONTRAST  TECHNIQUE: Contiguous axial images were obtained from the base of the skull through the vertex without intravenous contrast.  COMPARISON:  None.  FINDINGS: There is no evidence of acute infarction, mass lesion, or intra- or extra-axial hemorrhage on CT.  Prominence of the ventricles and sulci reflects  mild cortical volume loss. Mild cerebellar atrophy is noted. Mild periventricular and subcortical white matter change likely reflects small vessel ischemic microangiopathy.  The brainstem and fourth ventricle are within normal limits. The basal ganglia are unremarkable in appearance. The cerebral hemispheres demonstrate grossly normal gray-white differentiation. No mass effect or midline shift is seen.  There is no evidence of fracture; visualized osseous structures are unremarkable in appearance. The orbits are within normal limits. The paranasal sinuses and mastoid air cells are well-aerated. No significant soft tissue abnormalities are seen.  IMPRESSION: 1. No acute intracranial pathology seen on CT. 2. Mild cortical volume loss and scattered small vessel ischemic microangiopathy.  These results were called by telephone at the time of interpretation on 12/30/2014  at 7:49 pm to Dr. Jeanell Sparrow, who verbally acknowledged these results.   Electronically Signed   By: Garald Balding M.D.   On: 12/30/2014 19:44   Ct Angio Neck W/cm &/or Wo/cm  12/30/2014   CLINICAL DATA:  Sudden onset of RIGHT-sided weakness and slurred speech. Stroke risk factors include diabetes and chronic renal disease. Initial encounter.  EXAM: CT ANGIOGRAPHY HEAD AND NECK  TECHNIQUE: Multidetector CT imaging of the head and neck was performed using the standard protocol during bolus administration of intravenous contrast. Multiplanar CT image reconstructions and MIPs were obtained to evaluate the vascular anatomy. Carotid stenosis measurements (when applicable) are obtained utilizing NASCET criteria, using the distal internal carotid diameter as the denominator.  CONTRAST:  61mL OMNIPAQUE IOHEXOL 350 MG/ML SOLN  COMPARISON:  CT head earlier today.  FINDINGS: CT HEAD  Calvarium and skull base: No fracture or destructive lesion. Mastoids and middle ears are grossly clear.  Paranasal sinuses: Imaged portions are clear.  Orbits: Negative.  Brain: No evidence of acute hemorrhage, hydrocephalus, or mass lesion. Normal for age cerebral volume without significant confluent white matter disease.  CTA NECK  Aortic arch: Bovine trunk. Imaged portion shows no evidence of aneurysm or dissection. No significant stenosis of the major arch vessel origins.  Right carotid system: No evidence of dissection, stenosis (50% or greater) or occlusion.  Left carotid system: No evidence of dissection, stenosis (50% or greater) or occlusion.  Vertebral arteries: RIGHT dominant. No evidence of dissection, stenosis (50% or greater) or occlusion.  Soft tissues: Normal thyroid. Mild vascular congestion at the lung apices and moderate mediastinal fat. Previous CABG. Cervical spondylosis with multilevel disc space narrowing and reversal of the normal cervical lordotic curve.  CTA HEAD  Anterior circulation: There is relative slow flow in a LEFT  frontoparietal MCA cortical branch as seen on image 146 series 401. There may be subtle early cytotoxic edema in an adjacent gyrus. No significant stenosis, proximal occlusion, aneurysm, or vascular malformation.  Posterior circulation: No significant stenosis, proximal occlusion, aneurysm, or vascular malformation.  Venous sinuses: As permitted by contrast timing, patent.  Anatomic variants: None of significance.  Delayed phase:   No abnormal intracranial enhancement.  IMPRESSION: No large vessel occlusion or significant proximal stenosis.  Relative slow flow in a LEFT posterior frontoparietal MCA cortical branch could be indicative of a regional area of distal infarction. If no contraindications, consider MRI for further evaluation.  Findings discussed with ordering provider.   Electronically Signed   By: Rolla Flatten M.D.   On: 12/30/2014 21:17   Mr Brain Wo Contrast  12/31/2014   CLINICAL DATA:  Dysarthria. Clumsiness in the right hand. Right-sided weakness beginning subtly at 7 p.m. yesterday. NIHSS 3.  EXAM: MRI HEAD WITHOUT CONTRAST  TECHNIQUE: Multiplanar, multiecho pulse sequences of the brain and surrounding structures were obtained without intravenous contrast.  COMPARISON:  CTA head and neck 12/30/2014.  FINDINGS: The diffusion-weighted images confirm an acute nonhemorrhagic infarct in the left frontal operculum with 1 or 2 punctate areas more superiorly along the precentral gyrus in the region typically attributed to the right hand in the homunculus. T2 changes are noted in the left frontal operculum associated with the acute/ subacute infarct. No acute hemorrhage or mass lesion is present.  Mild generalized atrophy and periventricular white matter changes are present bilaterally. There are scattered subcortical T2 hyperintensities in the frontal lobes bilaterally.  Flow is present in the major intracranial arteries. Bilateral lens replacements are present. The globes and orbits are otherwise intact.  The paranasal sinuses and mastoid air cells are clear. The skullbase is unremarkable.  IMPRESSION: 1. Acute/subacute cortical infarcts involving the left frontal operculum and to lesser extent the left precentral gyrus. 2. Mild atrophy and white matter changes otherwise reflect the sequelae of chronic microvascular ischemia.   Electronically Signed   By: San Morelle M.D.   On: 12/31/2014 08:14    Microbiology: No results found for this or any previous visit (from the past 240 hour(s)).   Labs: Basic Metabolic Panel:  Recent Labs Lab 12/28/14 0906 12/30/14 1936 12/30/14 1944 12/31/14 0921  NA 137 136 139 139  K 4.8 4.4 4.6 4.1  CL 103 99 101 104  CO2 29 27  --  29  GLUCOSE 128* 167* 166* 175*  BUN 16 17 23 17   CREATININE 1.31 1.32 1.30 1.15  CALCIUM 9.3 9.6  --  9.0   Liver Function Tests:  Recent Labs Lab 12/30/14 1936  AST 19  ALT 17  ALKPHOS 35*  BILITOT 1.5*  PROT 6.7  ALBUMIN 3.6   No results for input(s): LIPASE, AMYLASE in the last 168 hours. No results for input(s): AMMONIA in the last 168 hours. CBC:  Recent Labs Lab 12/28/14 0906 12/30/14 1936 12/30/14 1944 12/31/14 0921  WBC 6.4 6.4  --  4.6  NEUTROABS 3.2 3.0  --   --   HGB 13.4 13.1 13.6 12.3*  HCT 38.7* 37.4* 40.0 35.2*  MCV 86.8 85.0  --  85.4  PLT 225.0 211  --  200   Cardiac Enzymes: No results for input(s): CKTOTAL, CKMB, CKMBINDEX, TROPONINI in the last 168 hours. BNP: BNP (last 3 results) No results for input(s): BNP in the last 8760 hours.  ProBNP (last 3 results) No results for input(s): PROBNP in the last 8760 hours.  CBG:  Recent Labs Lab 12/30/14 2011 12/31/14 0046 12/31/14 0614 12/31/14 1145 12/31/14 1619  GLUCAP 155* 146* 226* 189* 239*       Signed:  Kayton Dunaj MD [facility] 12/31/2014, 7:14 PM

## 2014-12-31 NOTE — Evaluation (Signed)
Speech Language Pathology Evaluation Patient Details Name: Walter [name] MRN: [id number] DOB: [birth year] Today's Date: 12/31/2014 Time: 7903-8333 SLP Time Calculation (min) (ACUTE ONLY): 30 min  Problem List:  Patient Active Problem List   Diagnosis Date Noted  . Stroke 12/31/2014  . Dysarthria 12/30/2014  . TIA (transient ischemic attack) 12/30/2014  . Chronic systolic heart failure 83/29/1916  . CAD (coronary artery disease), autologous vein bypass graft 02/02/2014  . Type II diabetes mellitus 02/02/2014  . Paroxysmal atrial fibrillation 02/02/2014  . Essential hypertension 02/02/2014  . Chronic anticoagulation 02/02/2014   Past Medical History:  Past Medical History  Diagnosis Date  . Chronic kidney disease   . Pleural effusion   . Abdominal pain   . Gum disease   . Colon polyp   . Diabetes mellitus   . Cancer     prostate   Past Surgical History:  Past Surgical History  Procedure Laterality Date  . Coronary artery bypass graft    . Colon surgery    . Cataract extraction  2006 and 2007    bilateral  . Colonoscopy     HPI:  69 yo male with DM2, hypertension, CKD admitted with dysarthria and clumsy right hand starting. Per MD documentation symptoms too mild for TPA per ED. Pt noted to be marijuana positive on uds however ,pt states that his symptoms didn't correlate with use. MRI revelaed acute/subacute cortical infarcts involving the left frontal operculum and to lesser extent the left precentral gyrus.   Assessment / Plan / Recommendation Clinical Impression  Pt 's safety awareness questionable deficits versus baseline behavior/personality. As SLP entered room pt was walking around bed changing sheets (balance appeared functional). He is alert, oriented with adequate sustained attention. Speech 100% intelligible, able to recall information from cardiologist this am and medications he takes. No ST needed at this time.      SLP Assessment  Patient does not need any  further Speech Lanaguage Pathology Services    Follow Up Recommendations  None    Frequency and Duration        Pertinent Vitals/Pain Pain Assessment: No/denies pain   SLP Goals     SLP Evaluation Prior Functioning  Cognitive/Linguistic Baseline: Within functional limits  Lives With: Spouse Vocation: Retired   Associate Professor  Overall Cognitive Status: Within Functional Limits for tasks assessed Orientation Level: Oriented X4 Awareness:  (questionable anticipatory awareness vs behavior/personality) Problem Solving: Appears intact Safety/Judgment:  (walking around room without supervision)    Comprehension  Auditory Comprehension Overall Auditory Comprehension: Appears within functional limits for tasks assessed Visual Recognition/Discrimination Discrimination: Not tested Reading Comprehension Reading Status: Within funtional limits    Expression Expression Primary Mode of Expression: Verbal Verbal Expression Overall Verbal Expression: Appears within functional limits for tasks assessed Pragmatics: No impairment Written Expression Dominant Hand: Right Written Expression: Within Functional Limits   Oral / Motor Oral Motor/Sensory Function Overall Oral Motor/Sensory Function: Appears within functional limits for tasks assessed Motor Speech Overall Motor Speech: Appears within functional limits for tasks assessed Intelligibility: Intelligible Motor Planning: Witnin functional limits   GO     Walter [name] 12/31/2014, 1:39 PM   Walter [name] M.Ed [employer] [phone]

## 2014-12-31 NOTE — Progress Notes (Signed)
Pt admitted to room 4N21 from ED.  Pt is alert and oriented. Pt is having slurred speech and trouble with fine motor movements with right hand.  Pt oriented to room and safety measures in place.  Will continue to monitor.    Fredrich Romans, RN

## 2014-12-31 NOTE — Discharge Instructions (Signed)
Metformin and X-ray Contrast Studies For some X-ray exams, a contrast dye is used. Contrast dye is a type of medicine used to make the X-ray image clearer. The contrast dye is given to the patient through a vein (intravenously). If you need to have this type of X-ray exam and you take a medication called metformin, your caregiver may have you stop taking metformin before the exam.  LACTIC ACIDOSIS In rare cases, a serious medical condition called lactic acidosis can develop in people who take metformin and receive contrast dye. The following conditions can increase the risk of this complication:   Kidney failure.  Liver problems.  Certain types of heart problems such as:  Heart failure.  Heart attack.  Heart infection.  Heart valve problems.  Alcohol abuse. If left untreated, lactic acidosis can lead to coma.  SYMPTOMS OF LACTIC ACIDOSIS Symptoms of lactic acidosis can include:  Rapid breathing (hyperventilation).  Neurologic symptoms such as:  Headaches.  Confusion.  Dizziness.  Excessive sweating.  Feeling sick to your stomach (nauseous) or throwing up (vomiting). AFTER THE X-RAY EXAM  Stay well-hydrated. Drink fluids as instructed by your caregiver.  If you have a risk of developing lactic acidosis, blood tests may be done to make sure your kidney function is okay.  Metformin is usually stopped for 48 hours after the X-ray exam. Ask your caregiver when you can start taking metformin again. SEEK MEDICAL CARE IF:   You have shortness of breath or difficulty breathing.  You develop a headache that does not go away.  You have nausea or vomiting.  You urinate more than normal.  You develop a skin rash and have:  Redness.  Swelling.  Itching. Document Released: 09/12/2009 Document Revised: 12/17/2011 Document Reviewed: 09/12/2009 [facility] Patient Information 2015 [place], [place]. This information is not intended to replace advice given to you by your health  care provider. Make sure you discuss any questions you have with your health care provider.  STROKE/TIA DISCHARGE INSTRUCTIONS SMOKING Cigarette smoking nearly doubles your risk of having a stroke & is the single most alterable risk factor  If you smoke or have smoked in the last 12 months, you are advised to quit smoking for your health.  Most of the excess cardiovascular risk related to smoking disappears within a year of stopping.  Ask you doctor about anti-smoking medications  [facility] Quit Line: 1-800-QUIT NOW  Free Smoking Cessation Classes [phone]  CHOLESTEROL Know your levels; limit fat & cholesterol in your diet  Lipid Panel     Component Value Date/Time   CHOL 121 12/31/2014 0555   TRIG 208* 12/31/2014 0555   HDL 35* 12/31/2014 0555   CHOLHDL 3.5 12/31/2014 0555   VLDL 42* 12/31/2014 0555   LDLCALC 44 12/31/2014 0555      Many patients benefit from treatment even if their cholesterol is at goal.  Goal: Total Cholesterol (CHOL) less than 160  Goal:  Triglycerides (TRIG) less than 150  Goal:  HDL greater than 40  Goal:  LDL (LDLCALC) less than 100   BLOOD PRESSURE American Stroke Association blood pressure target is less that 120/80 mm/Hg  Your discharge blood pressure is:  BP: 122/63 mmHg  Monitor your blood pressure  Limit your salt and alcohol intake  Many individuals will require more than one medication for high blood pressure  DIABETES (A1c is a blood sugar average for last 3 months) Goal HGBA1c is under 7% (HBGA1c is blood sugar average for last 3 months)  Diabetes:  Diagnosis of diabetes:  Your A1c:7.9 %    Lab Results  Component Value Date   HGBA1C 7.9* 05/21/2011     Your HGBA1c can be lowered with medications, healthy diet, and exercise.  Check your blood sugar as directed by your physician  Call your physician if you experience unexplained or low blood sugars.  PHYSICAL ACTIVITY/REHABILITATION Goal is 30 minutes at least 4 days per week    Activity: Increase activity slowly, and Walk with assistance,   Activity decreases your risk of heart attack and stroke and makes your heart stronger.  It helps control your weight and blood pressure; helps you relax and can improve your mood.  Participate in a regular exercise program.  Talk with your doctor about the best form of exercise for you (dancing, walking, swimming, cycling).  DIET/WEIGHT Goal is to maintain a healthy weight  Your discharge diet is: Diet heart healthy/carb modified Room service appropriate?: Yes; Fluid consistency:: Thin Diet - low sodium heart healthy thin liquids Your height is:  Height: 5\' 7"  (170.2 cm) Your current weight is: Weight: 98.158 kg (216 lb 6.4 oz) Your Body Mass Index (BMI) is:  BMI (Calculated): 33.7  Following the type of diet specifically designed for you will help prevent another stroke.  Your goal Body Mass Index (BMI) is 19-24.  Healthy food habits can help reduce 3 risk factors for stroke:  High cholesterol, hypertension, and excess weight.  RESOURCES Stroke/Support Group:  Call [phone]   STROKE EDUCATION PROVIDED/REVIEWED AND GIVEN TO PATIENT Stroke warning signs and symptoms How to activate emergency medical system (call 911). Medications prescribed at discharge. Need for follow-up after discharge. Personal risk factors for stroke. Pneumonia vaccine given: No Flu vaccine given: No My questions have been answered, the writing is legible, and I understand these instructions.  I will adhere to these goals & educational materials that have been provided to me after my discharge from the hospital.

## 2014-12-31 NOTE — Progress Notes (Signed)
STROKE TEAM PROGRESS NOTE   HISTORY Walter [name] is a 69 y.o. male with a history of afib who is off of anticoagulation for a catheterization tomorrow who presents with sudden onset right sided weakness and mild aphasia. He last took Xarelto 3/22 at 11pm. He feels that his symptoms may be improving slightly, but he does still have deficits. He also is having mild difficulty with speaking.    LKW: 7 pm tpa given?: no, mild symptoms, rivaroxaban within 48 hours.  NIHSS: 3 for dysarthria, aphasia, and facial droop.    SUBJECTIVE (INTERVAL HISTORY) No family members present. The patient feels he is back to baseline.   OBJECTIVE Temp:  [97.7 F (36.5 C)-98.6 F (37 C)] 98.2 F (36.8 C) (03/25 1504) Pulse Rate:  [54-130] 64 (03/25 1504) Cardiac Rhythm:  [-] Sinus bradycardia (03/25 1638) Resp:  [10-18] 16 (03/25 1504) BP: (101-149)/(57-75) 122/63 mmHg (03/25 1504) SpO2:  [94 %-100 %] 97 % (03/25 1504) Weight:  [97.5 kg (214 lb 15.2 oz)-98.158 kg (216 lb 6.4 oz)] 98.158 kg (216 lb 6.4 oz) (03/25 0357)   Recent Labs Lab 12/30/14 2011 12/31/14 0046 12/31/14 0614 12/31/14 1145  GLUCAP 155* 146* 226* 189*    Recent Labs Lab 12/28/14 0906 12/30/14 1936 12/30/14 1944 12/31/14 0921  NA 137 136 139 139  K 4.8 4.4 4.6 4.1  CL 103 99 101 104  CO2 29 27  --  29  GLUCOSE 128* 167* 166* 175*  BUN 16 17 23 17   CREATININE 1.31 1.32 1.30 1.15  CALCIUM 9.3 9.6  --  9.0    Recent Labs Lab 12/30/14 1936  AST 19  ALT 17  ALKPHOS 35*  BILITOT 1.5*  PROT 6.7  ALBUMIN 3.6    Recent Labs Lab 12/28/14 0906 12/30/14 1936 12/30/14 1944 12/31/14 0921  WBC 6.4 6.4  --  4.6  NEUTROABS 3.2 3.0  --   --   HGB 13.4 13.1 13.6 12.3*  HCT 38.7* 37.4* 40.0 35.2*  MCV 86.8 85.0  --  85.4  PLT 225.0 211  --  200   No results for input(s): CKTOTAL, CKMB, CKMBINDEX, TROPONINI in the last 168 hours.  Recent Labs  12/30/14 1936 12/31/14 0921  LABPROT 14.0 13.8  INR 1.07 1.05     Recent Labs  12/30/14 2125  COLORURINE YELLOW  LABSPEC 1.015  PHURINE 5.0  GLUCOSEU NEGATIVE  HGBUR NEGATIVE  BILIRUBINUR NEGATIVE  KETONESUR NEGATIVE  PROTEINUR NEGATIVE  UROBILINOGEN 0.2  NITRITE NEGATIVE  LEUKOCYTESUR NEGATIVE       Component Value Date/Time   CHOL 121 12/31/2014 0555   TRIG 208* 12/31/2014 0555   HDL 35* 12/31/2014 0555   CHOLHDL 3.5 12/31/2014 0555   VLDL 42* 12/31/2014 0555   LDLCALC 44 12/31/2014 0555   Lab Results  Component Value Date   HGBA1C 7.9* 05/21/2011      Component Value Date/Time   LABOPIA NONE DETECTED 12/30/2014 2125   COCAINSCRNUR NONE DETECTED 12/30/2014 2125   LABBENZ NONE DETECTED 12/30/2014 2125   AMPHETMU NONE DETECTED 12/30/2014 2125   THCU POSITIVE* 12/30/2014 2125   LABBARB NONE DETECTED 12/30/2014 2125     Recent Labs Lab 12/30/14 1935  ETH <5    Ct Angio Head and Neck W/cm &/or Wo Cm 12/30/2014    CTA NECK  Aortic arch: Bovine trunk. Imaged portion shows no evidence of aneurysm or dissection. No significant stenosis of the major arch vessel origins.  Right carotid system: No evidence of dissection, stenosis (50%  or greater) or occlusion.  Left carotid system: No evidence of dissection, stenosis (50% or greater) or occlusion.  Vertebral arteries: RIGHT dominant. No evidence of dissection, stenosis (50% or greater) or occlusion.  Soft tissues: Normal thyroid. Mild vascular congestion at the lung apices and moderate mediastinal fat. Previous CABG. Cervical spondylosis with multilevel disc space narrowing and reversal of the normal cervical lordotic curve.    CTA HEAD  Anterior circulation: There is relative slow flow in a LEFT frontoparietal MCA cortical branch as seen on image 146 series 401. There may be subtle early cytotoxic edema in an adjacent gyrus. No significant stenosis, proximal occlusion, aneurysm, or vascular malformation.  Posterior circulation: No significant stenosis, proximal occlusion, aneurysm, or  vascular malformation.  Venous sinuses: As permitted by contrast timing, patent.  Anatomic variants: None of significance.  Delayed phase:   No abnormal intracranial enhancement.   IMPRESSION:  No large vessel occlusion or significant proximal stenosis.  Relative slow flow in a LEFT posterior frontoparietal MCA cortical branch could be indicative of a regional area of distal infarction. If no contraindications, consider MRI for further evaluation.  Findings discussed with ordering provider.       Ct Head Wo Contrast 12/30/2014    1. No acute intracranial pathology seen on CT.  2. Mild cortical volume loss and scattered small vessel ischemic microangiopathy.     MRI of the brain 12/31/2014 1. Acute/subacute cortical infarcts involving the left frontal operculum and to lesser extent the left precentral gyrus. 2. Mild atrophy and white matter changes otherwise reflect the sequelae of chronic microvascular ischemia.    PHYSICAL EXAM Pleasant middle-age Caucasian male currently not in distress. . Afebrile. Head is nontraumatic. Neck is supple without bruit.    Cardiac exam no murmur or gallop. Lungs are clear to auscultation. Distal pulses are well felt. Neurological Exam ;  Awake  Alert oriented x 3. Normal  but slightly slow speech and language.eye movements full without nystagmus.fundi were not visualized. Vision acuity and fields appear normal. Hearing is normal. Palatal movements are normal. Face symmetric. Tongue midline. Normal strength, tone, reflexes and coordination. Normal sensation. Gait deferred.     ASSESSMENT/PLAN Mr. Walter [name] is a 69 y.o. male with history of coronary artery disease, chronic kidney disease, diabetes mellitus, atrial fibrillation on Xarelto which was held for a scheduled cardiac catheterization presenting with dysarthria, aphasia, right facial droop, and right upper extremity weakness. He did not receive IV t-PA due to mild deficits and recent Xarelto  therapy.   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  Resultant significant improvement in deficits  MRI  as above  MRA  please refer to CTA of the head and neck  Carotid Doppler  please refer to CT angiogram of the neck  2D Echo - (12/03/2014) EF 35-40% - no definite cardiac source of emboli identified  LDL - 44  HgbA1c pending  SCDs and Xarelto for VTE prophylaxis Diet heart healthy/carb modified Room service appropriate?: Yes; Fluid consistency:: Thin  Diet - low sodium heart healthy  liquids  xarelto ( rivaroxaban) prior to admission, now on xarelto ( rivaroxaban)  Ongoing aggressive stroke risk factor management  Therapy recommendations: No follow-up therapies recommended  Disposition:  Pending  Hypertension  Home meds: Lisinopril and metoprolol  Stable   Hyperlipidemia  Home meds:   Crestor - resumed in hospital  LDL 44, goal < 70  Continue statin at discharge  Diabetes  HgbA1c pending, goal < 7.0  Controlled  Other Stroke Risk Factors  Advanced age  Cigarette smoker, quit smoking years ago   ETOH use  Obesity, Body mass index is 33.88 kg/(m^2).   Family hx stroke   Coronary artery disease   Other Active Problems  Mildly elevated liver function tests  Coronary artery disease - cardiac catheterization postponed   PLAN  Postpone heart catheterization as intervention would not be an option with recent stroke.  Reschedule heart catheterization with Lovenox or heparin bridging.  Follow-up in the office with Dr. Leonie Man in 2 months.  Stroke team will sign off. Please call if we can be of further service.  Takes Xarelto with food.   Hospital day # 1  Mikey Bussing PA-C [facility] Pager [phone] 12/31/2014, 4:55 PM I have personally examined this  patient, reviewed notes, independently viewed imaging studies, participated in medical decision making and plan of care. I have made any additions or clarifications directly to the above note. Agree with note above. He is at recurrent risk for stroke, TIA, neurological worsening and needs ongoing stroke evaluation and aggressive risk factor control. He needs to be on Xarelto but I advised him to take it with a heavy mail every day as if taken on a empty stomach it has heavy first pass metabolism in the liver which reduces its half life. Resume Xarelto .Elective cardiac catheterization in 4-6 weeks per cardiology. Recommend hospitalization one day prior to the procedure with IV heparin bridge until 1 hour prior to catheterization Antony Contras, MD Medical Director [place] Pager: [phone] 12/31/2014 5:38 PM     To contact Stroke Continuity provider, please refer to [email or web address] After hours, contact General Neurology

## 2014-12-31 NOTE — Evaluation (Signed)
Physical Therapy Evaluation Patient Details Name: Walter [name] MRN: [id number] DOB: [birth year] Today's Date: 12/31/2014   History of Present Illness  This 69 year old man was admitted with dysarthria and clumsy R hand. MRI revealed acute and subacute cortical infarcts involving L frontal operculum and to a lesser extent L precentral gyrus.  Pt has a PMH significant for CKD, DM2, and HTN  Clinical Impression  Patient verbalized understanding of CVA modifiable risk factors and resources available post hospital discharge.  Patient independent with basic mobility including stair negotiation. Demo mild Rt leg balance deficits, [name] encouraged patient to practice SLS and tandem stance at home near counter with return demonstration understanding.  No further therapy services warranted at this time. Feel patient ready for discharge home with wife when medically able.    Follow Up Recommendations No PT follow up;Supervision - Intermittent    Equipment Recommendations  None recommended by PT    Recommendations for Other Services       Precautions / Restrictions Precautions Precautions: None Restrictions Weight Bearing Restrictions: No      Mobility  Bed Mobility Overal bed mobility: Independent                Transfers Overall transfer level: Independent Equipment used: None             General transfer comment: without UE support from bed  Ambulation/Gait Ambulation/Gait assistance: Independent Ambulation Distance (Feet): 350 Feet Assistive device: None Gait Pattern/deviations: WFL(Within Functional Limits) Gait velocity: 20' in 10.02 sec = 1.99 ft/sec Gait velocity interpretation: Below normal speed for age/gender    Stairs Stairs: Yes Stairs assistance: Independent Stair Management: No rails;Forwards;Alternating pattern Number of Stairs: 3 (practiced twice)    Wheelchair Mobility    Modified Rankin (Stroke Patients Only) Modified Rankin (Stroke Patients  Only) Pre-Morbid Rankin Score: No symptoms Modified Rankin: No significant disability     Balance Overall balance assessment: Modified Independent                                           Pertinent Vitals/Pain Pain Assessment: No/denies pain    Home Living Family/patient expects to be discharged to:: Private residence Living Arrangements: Spouse/significant other Available Help at Discharge: Family;Available 24 hours/day Type of Home: House Home Access: Stairs to enter Entrance Stairs-Rails: Right Entrance Stairs-Number of Steps: 2 (via garage) Home Layout: Two level;Able to live on main level with bedroom/bathroom Home Equipment: None Additional Comments: does not access upper level except on occasion    Prior Function Level of Independence: Independent         Comments: has 55# dog which he walks on leash     Hand Dominance   Dominant Hand: Right    Extremity/Trunk Assessment   Upper Extremity Assessment: Defer to OT evaluation           Lower Extremity Assessment: Overall WFL for tasks assessed (4/5 bil hip flex, 5/5 bil knee ext, 5/5 bil ankle DF)      Cervical / Trunk Assessment: Kyphotic  Communication   Communication: No difficulties  Cognition Arousal/Alertness: Awake/alert Behavior During Therapy: WFL for tasks assessed/performed Overall Cognitive Status: Within Functional Limits for tasks assessed                      General Comments General comments (skin integrity, edema, etc.): SLS Lt 6.32, Rt 1.81 sec, Tandem  Lt foot post 30 sec, Rt foot post 19.18sec    Exercises        Assessment/Plan    PT Assessment Patent does not need any further PT services  PT Diagnosis Abnormality of gait;Hemiplegia dominant side   PT Problem List    PT Treatment Interventions     PT Goals (Current goals can be found in the Care Plan section) Acute Rehab PT Goals Patient Stated Goal: go home PT Goal Formulation: With  patient Time For Goal Achievement: 01/01/15 Potential to Achieve Goals: Good    Frequency     Barriers to discharge        Co-evaluation               End of Session   Activity Tolerance: Patient tolerated treatment well Patient left: in bed;with call bell/phone within reach;with family/visitor present Nurse Communication: Mobility status         Time: 0737-1062 PT Time Calculation (min) (ACUTE ONLY): 26 min   Charges:   PT Evaluation $Initial PT Evaluation Tier I: 1 Procedure     PT G CodesMalka [name], PT [phone] Walter [name] 12/31/2014, 5:01 PM

## 2014-12-31 NOTE — Consult Note (Signed)
Name: Minas Bonser is a 69 y.o. male Admit date: 12/30/2014 Referring Physician:  Grandville Silos, M.D. Primary Physician:  Lavone Orn, M.D. Primary Cardiologist:  HW[facility] Blenda Bridegroom, M.D.  Reason for Consultation:  Embolic CVA  ASSESSMENT: 1,. 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 Awaiting final recommendations from neurology  2. Long-standing history of paroxysmal atrial fibrillation. The patient has been on Xarelto for greater than 12 months. Xarelto dosing was interrupted for 48 hours in preparation for coronary angiography today of the patient was admitted last evening with right arm weakness and dysarthria.. 3. Coronary artery disease with prior bypass grafting  4. Paroxysmal atrial fibrillation. Previously documented by continuous monitoring. A. fib history extends to pre-coronary bypass grafting. No evidence of atrial fibrillation on this admission to the hospital  5. Hypertension, a central 6. Diabetes mellitus     PLAN: 1. Coronary angiography has been counseled 2. Begin amiodarone 400 mg by mouth twice a day. Discharge on 200 mg twice a day 3. Resume anticoagulation unless the neurology service feels otherwise 4. We will consider coronary angiography after 4-6 weeks of continuous anticoagulation   HPI: The patient is 69 years of age and has been followed by me for several years. He has a history of coronary bypass grafting performed at [facility] by Dr. Irena Cords. He has had a prolonged history of atrial fibrillation. Approximately 18 months ago we converted from aspirin to rivaroxiban. This medication was held 48 hours prior to admission with right arm clumsiness and dysarthria. He is to have coronary angiography today. Indication for the calf was to explain new systolic heart failure with an EF of 35-40% by echocardiography. He has no prior history of myocardial infarction or systolic dysfunction. He is asymptomatic with  reference to [facility].  Today he states that the weakness of his right arm and speech are close to normal. He also noted right facial asymmetry acutely but this is resolved.  PMH:   Past Medical History  Diagnosis Date  . Chronic kidney disease   . Pleural effusion   . Abdominal pain   . Gum disease   . Colon polyp   . Diabetes mellitus   . Cancer     prostate    PSH:   Past Surgical History  Procedure Laterality Date  . Coronary artery bypass graft    . Colon surgery    . Cataract extraction  2006 and 2007    bilateral  . Colonoscopy     Allergies:  Review of patient's allergies indicates no known allergies. Prior to Admit Meds:   Prescriptions prior to admission  Medication Sig Dispense Refill Last Dose  . amoxicillin-clavulanate (AUGMENTIN) 500-125 MG per tablet Take 1 tablet by mouth 2 (two) times daily. STARTED 12/29/14, for 10 days ENDING 01/08/15  0 12/30/2014 at Unknown time  . aspirin 81 MG tablet Take 81 mg by mouth daily.     12/30/2014 at Unknown time  . buPROPion (WELLBUTRIN XL) 150 MG 24 hr tablet Take 150 mg by mouth daily.  0 12/30/2014 at Unknown time  . escitalopram (LEXAPRO) 20 MG tablet Take 20 mg by mouth daily.     12/30/2014 at Unknown time  . HUMALOG KWIKPEN 100 UNIT/ML KiwkPen Inject 14-15 Units as directed daily.   1 12/30/2014 at Unknown time  . insulin detemir (LEVEMIR) 100 UNIT/ML injection Inject 80 Units into the skin at bedtime.    12/29/2014 at Unknown time  . lisinopril (PRINIVIL,ZESTRIL) 20  MG tablet TAKE 1 TABLET DAILY 90 tablet 0 12/30/2014 at Unknown time  . LOVAZA 1 G capsule Take 1,000 mg by mouth 2 (two) times daily.    12/30/2014 at Unknown time  . metoprolol succinate (TOPROL-XL) 100 MG 24 hr tablet Take 1 tablet (100 mg total) by mouth daily. 90 tablet 3 12/30/2014 at 1000  . Omega-3-acid Ethyl Esters (LOVAZA PO) Take 1 tablet by mouth 2 (two) times daily.    12/30/2014 at Unknown time  . rosuvastatin (CRESTOR) 10 MG tablet Take 10 mg by mouth at  bedtime.    12/29/2014 at Unknown time  . TOVIAZ 4 MG TB24 tablet Take 4 mg by mouth daily.   12/29/2014 at Unknown time  . VIAGRA 100 MG tablet Take 1 tablet by mouth daily as needed for erectile dysfunction. ED   Past Month at Unknown time  . JANUMET XR 50-1000 MG TB24 Take 1 tablet by mouth 2 (two) times daily.    TODAY at [place]  . XARELTO 20 MG TABS tablet TAKE 1 TABLET DAILY 90 tablet 1 YESTERDAY at ON HOLD   Fam HX:    Family History  Problem Relation Age of Onset  . Angina Mother   . Diabetes Father   . Coronary artery disease Father    Social HX:    History   Social History  . Marital Status: Married    Spouse Name: N/A  . Number of Children: N/A  . Years of Education: N/A   Occupational History  . Not on file.   Social History Main Topics  . Smoking status: Former Research scientist (life sciences)  . Smokeless tobacco: Never Used  . Alcohol Use: 0.0 oz/week    0 Standard drinks or equivalent per week     Comment: social  . Drug Use: Not on file  . Sexual Activity: Not on file   Other Topics Concern  . Not on file   Social History Narrative     Review of Systems: Denies angina. Denies dyspnea. No headache. Does not feel he has had any episodes of atrial fibrillation recently. Recent continuous monitor demonstrates multiple episodes of atrial fibrillation, that appeared to document temporal relationship with complaints of palpitations.  Physical Exam: Blood pressure 117/69, pulse 60, temperature 97.8 F (36.6 C), temperature source Oral, resp. rate 16, height 5\' 7"  (1.702 m), weight 216 lb 6.4 oz (98.158 kg), SpO2 98 %. Weight change:    Skin is clear HEENT exam is unremarkable. Neck exam reveals no carotid bruits. Chest is clear Cardiac exam reveals a 2/6 systolic murmur of the right upper sternal border Extremities reveal no edema. Pulses are 2+ and symmetric Abdomen soft without tenderness. Bowel sounds are normal. Neurological exam reveals mild slowing of speech and slight  decrease in dexterity of the right hand  Labs: Lab Results  Component Value Date   WBC 4.6 12/31/2014   HGB 12.3* 12/31/2014   HCT 35.2* 12/31/2014   MCV 85.4 12/31/2014   PLT 200 12/31/2014    Recent Labs Lab 12/30/14 1936  12/31/14 0921  NA 136  < > 139  K 4.4  < > 4.1  CL 99  < > 104  CO2 27  --  29  BUN 17  < > 17  CREATININE 1.32  < > 1.15  CALCIUM 9.6  --  9.0  PROT 6.7  --   --   BILITOT 1.5*  --   --   ALKPHOS 35*  --   --  ALT 17  --   --   AST 19  --   --   GLUCOSE 167*  < > 175*  < > = values in this interval not displayed. No results found for: PTT Lab Results  Component Value Date   INR 1.07 12/30/2014   INR 1.1* 12/28/2014   INR 1.00 05/21/2011   No results found for: CKTOTAL, CKMB, CKMBINDEX, TROPONINI   Lab Results  Component Value Date   CHOL 121 12/31/2014   Lab Results  Component Value Date   HDL 35* 12/31/2014   Lab Results  Component Value Date   LDLCALC 44 12/31/2014   Lab Results  Component Value Date   TRIG 208* 12/31/2014   Lab Results  Component Value Date   CHOLHDL 3.5 12/31/2014   No results found for: LDLDIRECT    Radiology:  Ct Angio Head W/cm &/or Wo Cm  12/30/2014   CLINICAL DATA:  Sudden onset of RIGHT-sided weakness and slurred speech. Stroke risk factors include diabetes and chronic renal disease. Initial encounter.  EXAM: CT ANGIOGRAPHY HEAD AND NECK  TECHNIQUE: Multidetector CT imaging of the head and neck was performed using the standard protocol during bolus administration of intravenous contrast. Multiplanar CT image reconstructions and MIPs were obtained to evaluate the vascular anatomy. Carotid stenosis measurements (when applicable) are obtained utilizing NASCET criteria, using the distal internal carotid diameter as the denominator.  CONTRAST:  5mL OMNIPAQUE IOHEXOL 350 MG/ML SOLN  COMPARISON:  CT head earlier today.  FINDINGS: CT HEAD  Calvarium and skull base: No fracture or destructive lesion. Mastoids and  middle ears are grossly clear.  Paranasal sinuses: Imaged portions are clear.  Orbits: Negative.  Brain: No evidence of acute hemorrhage, hydrocephalus, or mass lesion. Normal for age cerebral volume without significant confluent white matter disease.  CTA NECK  Aortic arch: Bovine trunk. Imaged portion shows no evidence of aneurysm or dissection. No significant stenosis of the major arch vessel origins.  Right carotid system: No evidence of dissection, stenosis (50% or greater) or occlusion.  Left carotid system: No evidence of dissection, stenosis (50% or greater) or occlusion.  Vertebral arteries: RIGHT dominant. No evidence of dissection, stenosis (50% or greater) or occlusion.  Soft tissues: Normal thyroid. Mild vascular congestion at the lung apices and moderate mediastinal fat. Previous CABG. Cervical spondylosis with multilevel disc space narrowing and reversal of the normal cervical lordotic curve.  CTA HEAD  Anterior circulation: There is relative slow flow in a LEFT frontoparietal MCA cortical branch as seen on image 146 series 401. There may be subtle early cytotoxic edema in an adjacent gyrus. No significant stenosis, proximal occlusion, aneurysm, or vascular malformation.  Posterior circulation: No significant stenosis, proximal occlusion, aneurysm, or vascular malformation.  Venous sinuses: As permitted by contrast timing, patent.  Anatomic variants: None of significance.  Delayed phase:   No abnormal intracranial enhancement.  IMPRESSION: No large vessel occlusion or significant proximal stenosis.  Relative slow flow in a LEFT posterior frontoparietal MCA cortical branch could be indicative of a regional area of distal infarction. If no contraindications, consider MRI for further evaluation.  Findings discussed with ordering provider.   Electronically Signed   By: Rolla Flatten M.D.   On: 12/30/2014 21:17   Ct Head Wo Contrast  12/30/2014   CLINICAL DATA:  Code stroke. Acute onset of right-sided  weakness, facial droop and slurred speech. Initial encounter.  EXAM: CT HEAD WITHOUT CONTRAST  TECHNIQUE: Contiguous axial images were obtained from the base  of the skull through the vertex without intravenous contrast.  COMPARISON:  None.  FINDINGS: There is no evidence of acute infarction, mass lesion, or intra- or extra-axial hemorrhage on CT.  Prominence of the ventricles and sulci reflects mild cortical volume loss. Mild cerebellar atrophy is noted. Mild periventricular and subcortical white matter change likely reflects small vessel ischemic microangiopathy.  The brainstem and fourth ventricle are within normal limits. The basal ganglia are unremarkable in appearance. The cerebral hemispheres demonstrate grossly normal gray-white differentiation. No mass effect or midline shift is seen.  There is no evidence of fracture; visualized osseous structures are unremarkable in appearance. The orbits are within normal limits. The paranasal sinuses and mastoid air cells are well-aerated. No significant soft tissue abnormalities are seen.  IMPRESSION: 1. No acute intracranial pathology seen on CT. 2. Mild cortical volume loss and scattered small vessel ischemic microangiopathy.  These results were called by telephone at the time of interpretation on 12/30/2014 at 7:49 pm to Dr. Jeanell Sparrow, who verbally acknowledged these results.   Electronically Signed   By: Garald Balding M.D.   On: 12/30/2014 19:44   Ct Angio Neck W/cm &/or Wo/cm  12/30/2014   CLINICAL DATA:  Sudden onset of RIGHT-sided weakness and slurred speech. Stroke risk factors include diabetes and chronic renal disease. Initial encounter.  EXAM: CT ANGIOGRAPHY HEAD AND NECK  TECHNIQUE: Multidetector CT imaging of the head and neck was performed using the standard protocol during bolus administration of intravenous contrast. Multiplanar CT image reconstructions and MIPs were obtained to evaluate the vascular anatomy. Carotid stenosis measurements (when applicable)  are obtained utilizing NASCET criteria, using the distal internal carotid diameter as the denominator.  CONTRAST:  67mL OMNIPAQUE IOHEXOL 350 MG/ML SOLN  COMPARISON:  CT head earlier today.  FINDINGS: CT HEAD  Calvarium and skull base: No fracture or destructive lesion. Mastoids and middle ears are grossly clear.  Paranasal sinuses: Imaged portions are clear.  Orbits: Negative.  Brain: No evidence of acute hemorrhage, hydrocephalus, or mass lesion. Normal for age cerebral volume without significant confluent white matter disease.  CTA NECK  Aortic arch: Bovine trunk. Imaged portion shows no evidence of aneurysm or dissection. No significant stenosis of the major arch vessel origins.  Right carotid system: No evidence of dissection, stenosis (50% or greater) or occlusion.  Left carotid system: No evidence of dissection, stenosis (50% or greater) or occlusion.  Vertebral arteries: RIGHT dominant. No evidence of dissection, stenosis (50% or greater) or occlusion.  Soft tissues: Normal thyroid. Mild vascular congestion at the lung apices and moderate mediastinal fat. Previous CABG. Cervical spondylosis with multilevel disc space narrowing and reversal of the normal cervical lordotic curve.  CTA HEAD  Anterior circulation: There is relative slow flow in a LEFT frontoparietal MCA cortical branch as seen on image 146 series 401. There may be subtle early cytotoxic edema in an adjacent gyrus. No significant stenosis, proximal occlusion, aneurysm, or vascular malformation.  Posterior circulation: No significant stenosis, proximal occlusion, aneurysm, or vascular malformation.  Venous sinuses: As permitted by contrast timing, patent.  Anatomic variants: None of significance.  Delayed phase:   No abnormal intracranial enhancement.  IMPRESSION: No large vessel occlusion or significant proximal stenosis.  Relative slow flow in a LEFT posterior frontoparietal MCA cortical branch could be indicative of a regional area of distal  infarction. If no contraindications, consider MRI for further evaluation.  Findings discussed with ordering provider.   Electronically Signed   By: Michae Kava.D.  On: 12/30/2014 21:17   Mr Brain Wo Contrast  12/31/2014   CLINICAL DATA:  Dysarthria. Clumsiness in the right hand. Right-sided weakness beginning subtly at 7 p.m. yesterday. NIHSS 3.  EXAM: MRI HEAD WITHOUT CONTRAST  TECHNIQUE: Multiplanar, multiecho pulse sequences of the brain and surrounding structures were obtained without intravenous contrast.  COMPARISON:  CTA head and neck 12/30/2014.  FINDINGS: The diffusion-weighted images confirm an acute nonhemorrhagic infarct in the left frontal operculum with 1 or 2 punctate areas more superiorly along the precentral gyrus in the region typically attributed to the right hand in the homunculus. T2 changes are noted in the left frontal operculum associated with the acute/ subacute infarct. No acute hemorrhage or mass lesion is present.  Mild generalized atrophy and periventricular white matter changes are present bilaterally. There are scattered subcortical T2 hyperintensities in the frontal lobes bilaterally.  Flow is present in the major intracranial arteries. Bilateral lens replacements are present. The globes and orbits are otherwise intact. The paranasal sinuses and mastoid air cells are clear. The skullbase is unremarkable.  IMPRESSION: 1. Acute/subacute cortical infarcts involving the left frontal operculum and to lesser extent the left precentral gyrus. 2. Mild atrophy and white matter changes otherwise reflect the sequelae of chronic microvascular ischemia.   Electronically Signed   By: San Morelle M.D.   On: 12/31/2014 08:14    EKG:  Normal sinus rhythm with slight left axis deviation  ECHOCARDIOGRAM: Study Conclusions  - Left ventricle: Poor image quality inferior wall hypokinesis. The cavity size was normal. Wall thickness was increased in a pattern of mild LVH.  Systolic function was moderately reduced. The estimated ejection fraction was in the range of 35% to 40%. - Left atrium: The atrium was moderately dilated. - Atrial septum: No defect or patent foramen ovale was identified.   Sinclair Grooms 12/31/2014 11:12 AM

## 2014-12-31 NOTE — Evaluation (Signed)
Occupational Therapy Evaluation Patient Details Name: Walter [name] MRN: [id number] DOB: 10-Nov-1945 Today's Date: 12/31/2014    History of Present Illness This 69 year old man was admitted with dysarthria and clumsy R hand. MRI revealed acute and subacute cortical infarcts involving L frontal operculum and to a lesser extent L precentral gyrus.  Pt has a PMH significant for CKD, DM2, and HTN   Clinical Impression   Pt was admitted for the above.  He demonstrates mild unsteadiness but no LOB during OT. Will follow in acute setting with mod I level goals.  Pt was independent prior to admission and he currently needs min guard for safety when ambulating for ADLs.      Follow Up Recommendations  Supervision/Assistance - 24 hour (initially)    Equipment Recommendations  None recommended by OT    Recommendations for Other Services       Precautions / Restrictions Precautions Precautions: Fall Restrictions Weight Bearing Restrictions: No      Mobility Bed Mobility Overal bed mobility: Independent                Transfers Overall transfer level: Independent Equipment used: None                  Balance Overall balance assessment:  (min guard for safety; slightly unsteady, no LOB)                                          ADL Overall ADL's : Needs assistance/impaired     Grooming: Supervision/safety;Standing   Upper Body Bathing: Set up;Sitting   Lower Body Bathing: Supervison/ safety;Sit to/from stand   Upper Body Dressing : Set up;Sitting   Lower Body Dressing: Supervision/safety;Sit to/from stand   Toilet Transfer: Min guard;Ambulation   Toileting- Clothing Manipulation and Hygiene: Modified independent;Sit to/from stand   Tub/ Shower Transfer: Walk-in shower;Min guard (simulated ledge)     General ADL Comments: Pt feels he is near his baseline:  balance is still a little off.  Dexterity has improved in R hand.  Pt normally steps  onto built in seat to wash feet.  They do have a shower seat at home: recommended he use this or sit on commode to wash feet for improved safety.  Coordination for ADLs WFLs, recommended he practice any tasks that are difficult for HEP (non-sharp).  Discussed general safety such as avoiding stepping on stools, ladders, tools initially.  Also educated that MD would have to guide him for returning to driving.     Vision     Perception     Praxis      Pertinent Vitals/Pain Pain Assessment: No/denies pain     Hand Dominance Right   Extremity/Trunk Assessment Upper Extremity Assessment Upper Extremity Assessment: Overall WFL for tasks assessed; pt reports normal sensation           Communication Communication Communication: No difficulties--had a little bit of word finding difficulty, but able to communicate thought with extra time   Cognition Arousal/Alertness: Awake/alert Behavior During Therapy: [facility] for tasks assessed/performed Overall Cognitive Status: Within Functional Limits for tasks assessed                     General Comments       Exercises       Shoulder Instructions      Home Living Family/patient expects to be discharged to::  Private residence Living Arrangements: Spouse/significant other                 Bathroom Shower/Tub: Occupational psychologist: Standard            Lives With: Spouse    Prior Functioning/Environment Level of Independence: Independent             OT Diagnosis: Generalized weakness   OT Problem List: Impaired balance (sitting and/or standing)   OT Treatment/Interventions: Self-care/ADL training;DME and/or AE instruction;Patient/family education;Balance training    OT Goals(Current goals can be found in the care plan section) Acute Rehab OT Goals Patient Stated Goal: none stated:  agreeable to OT ADL Goals Pt Will Transfer to Toilet: with modified independence;ambulating;regular height toilet Pt  Will Perform Tub/Shower Transfer: Shower transfer;ambulating;with modified independence Additional ADL Goal #1: pt will gather clothes at mod I level and complete ADL  OT Frequency: Min 2X/week   Barriers to D/C:            Co-evaluation              End of Session    Activity Tolerance: Patient tolerated treatment well Patient left: in bed;with call bell/phone within reach;with family/visitor present;with bed alarm set;with nursing/sitter in room   Time: 8177-1165 OT Time Calculation (min): 15 min Charges:  OT General Charges $OT Visit: 1 Procedure OT Evaluation $Initial OT Evaluation Tier I: 1 Procedure G-Codes:    Gene Colee 2015/01/22, 2:37 PM Lesle Chris, OTR/L [phone] 2015/01/22

## 2014-12-31 NOTE — Progress Notes (Signed)
CARE MANAGEMENT NOTE 12/31/2014  Patient:  Walter [name], Walter [name]   Account Number:  [id number]  Date Initiated:  12/31/2014  Documentation initiated by:  Olga Coaster  Subjective/Objective Assessment:   ADMITTED WITH STROKE     Action/Plan:   CM FOLLOWING FOR DCP   Anticipated DC Date:  01/03/2015   Anticipated DC Plan:  AWAITING FOR PT/OT EVALS FOR DISPOSITION NEEDS     DC Planning Services  CM consult         Status of service:  In process, will continue to follow  Per UR Regulation:  Reviewed for med. necessity/level of care/duration of stay  Comments:  3/25/2016Mindi Slicker RN,BSN,MHA [phone]

## 2015-01-01 LAB — HEMOGLOBIN A1C
HEMOGLOBIN A1C: 8.7 % — AB (ref 4.8–5.6)
MEAN PLASMA GLUCOSE: 203 mg/dL

## 2015-01-03 ENCOUNTER — Telehealth: Payer: Self-pay | Admitting: Interventional Cardiology

## 2015-01-03 LAB — ANTINUCLEAR ANTIBODIES, IFA: ANTINUCLEAR ANTIBODIES, IFA: NEGATIVE

## 2015-01-03 NOTE — Telephone Encounter (Signed)
New Message    Patient is calling because he has questions about his medication the safety of the medication.  Pt c/o medication issue:  1. Name of Medication: amiodarone   2. How are you currently taking this medication (dosage and times per day)?200mg / twice a day   3. Are you having a reaction (difficulty breathing--STAT)? No   4. What is your medication issue? He doesn't know how he has the prescription and why

## 2015-01-03 NOTE — Telephone Encounter (Signed)
Patient calling with concern regarding the delay to treat his AFib in light of his recent stroke/TIA that resulted in his Cath being cancelled. He has questions for Dr. Tamala Julian: 1) Requesting to schedule appointment with Dr. Tamala Julian ASAP 2) Rescheduling his Cardia Cath ASAP 3) Getting Ablation scheduled ASAP 4) Will he be getting a referral to an EP MD? 5) What is the soonest he can drive again? 6) Wants Aggressive Treatment 7) Request No Delays (he does not want to wait 4-6 weeks to proceed with treatment).  Patient verbalized understanding of his Discharge Instructions but he states he prefers not to wait in order to "fix" the problem. Patient also had questions about new medication, amiodarone - education provided.  Reviewed above with Dr. Tamala Julian. Dr. Tamala Julian will see him in appointment on Wednesday, April 6th, at 12 noon. Dr. Tamala Julian said he will discuss above issues with the patient at that time. Called patient back to notify him of appointment date/time. Also informed him that Dr. Tamala Julian stated that he is not going to lift the driving restriction at this time. Patient is to rest and heal from last week's events. Patient verbalized understanding and agreement to appointment date/time.

## 2015-01-04 DIAGNOSIS — N182 Chronic kidney disease, stage 2 (mild): Secondary | ICD-10-CM | POA: Diagnosis not present

## 2015-01-04 DIAGNOSIS — E1122 Type 2 diabetes mellitus with diabetic chronic kidney disease: Secondary | ICD-10-CM | POA: Diagnosis not present

## 2015-01-12 ENCOUNTER — Encounter: Payer: Self-pay | Admitting: Interventional Cardiology

## 2015-01-12 ENCOUNTER — Ambulatory Visit (INDEPENDENT_AMBULATORY_CARE_PROVIDER_SITE_OTHER): Payer: Medicare Other | Admitting: Interventional Cardiology

## 2015-01-12 VITALS — BP 112/70 | HR 58 | Ht 67.0 in | Wt 211.8 lb

## 2015-01-12 DIAGNOSIS — I5022 Chronic systolic (congestive) heart failure: Secondary | ICD-10-CM | POA: Diagnosis not present

## 2015-01-12 DIAGNOSIS — I25810 Atherosclerosis of coronary artery bypass graft(s) without angina pectoris: Secondary | ICD-10-CM | POA: Diagnosis not present

## 2015-01-12 DIAGNOSIS — I10 Essential (primary) hypertension: Secondary | ICD-10-CM

## 2015-01-12 DIAGNOSIS — Z7901 Long term (current) use of anticoagulants: Secondary | ICD-10-CM | POA: Diagnosis not present

## 2015-01-12 DIAGNOSIS — I639 Cerebral infarction, unspecified: Secondary | ICD-10-CM

## 2015-01-12 DIAGNOSIS — I4891 Unspecified atrial fibrillation: Secondary | ICD-10-CM

## 2015-01-12 MED ORDER — AMIODARONE HCL 200 MG PO TABS: 200.0000 mg | ORAL_TABLET | Freq: Every day | ORAL | Status: DC

## 2015-01-12 MED ORDER — AMIODARONE HCL 200 MG PO TABS: 200.0000 mg | ORAL_TABLET | Freq: Two times a day (BID) | ORAL | Status: DC

## 2015-01-12 NOTE — Progress Notes (Addendum)
Cardiology Office Note   Date:  01/12/2015   ID:  Othmar Ringer, DOB April 13, 1946, MRN [id number]  PCP:  Irven Shelling, MD  Cardiologist:   Sinclair Grooms, MD   No chief complaint on file.     History of Present Illness: Walter [name] is a 69 y.o. male who presents for follow-up atrial fibrillation, CAD, decreased LVEF, and recent transient ischemic attack upon discontinuation of anticoagulation therapy in preparation for coronary angiography. He was hospitalized for 48 hours. Neurological(completely resolved. He was seen by neurology and no structural abnormalities were identified. The assumption is that he had a cardioembolic complication of anticoagulation discontinuation. He has not had angina. He is reluctant to have coronary angiography at this time because of what happened while off Xarelto.    Past Medical History  Diagnosis Date  . Chronic kidney disease   . Pleural effusion   . Abdominal pain   . Gum disease   . Colon polyp   . Diabetes mellitus   . Cancer     prostate    Past Surgical History  Procedure Laterality Date  . Coronary artery bypass graft    . Colon surgery    . Cataract extraction  2006 and 2007    bilateral  . Colonoscopy       Current Outpatient Prescriptions  Medication Sig Dispense Refill  . amiodarone (PACERONE) 200 MG tablet Take 1 tablet (200 mg total) by mouth 2 (two) times daily. 60 tablet 0  . amiodarone (PACERONE) 200 MG tablet Take 1 tablet (200 mg total) by mouth daily. 90 tablet 2  . buPROPion (WELLBUTRIN XL) 150 MG 24 hr tablet Take 150 mg by mouth daily.  0  . escitalopram (LEXAPRO) 20 MG tablet Take 20 mg by mouth daily.      Marland Kitchen HUMALOG KWIKPEN 100 UNIT/ML KiwkPen Inject 14-15 Units into the skin daily.   1  . insulin detemir (LEVEMIR) 100 UNIT/ML injection Inject 80 Units into the skin at bedtime.     Marland Kitchen JANUMET XR 50-1000 MG TB24 Take 1 tablet by mouth 2 (two) times daily.     Marland Kitchen lisinopril (PRINIVIL,ZESTRIL) 20 MG tablet  TAKE 1 TABLET DAILY 90 tablet 0  . LOVAZA 1 G capsule Take 1,000 mg by mouth 2 (two) times daily.     . metoprolol succinate (TOPROL-XL) 50 MG 24 hr tablet Take 1 tablet (50 mg total) by mouth daily. 30 tablet 0  . Omega-3-acid Ethyl Esters (LOVAZA PO) Take 1 tablet by mouth 2 (two) times daily.     . rosuvastatin (CRESTOR) 10 MG tablet Take 10 mg by mouth at bedtime.     . TOVIAZ 4 MG TB24 tablet Take 4 mg by mouth daily.    Marland Kitchen VIAGRA 100 MG tablet Take 1 tablet by mouth daily as needed for erectile dysfunction. ED    . XARELTO 20 MG TABS tablet TAKE 1 TABLET DAILY 90 tablet 1   No current facility-administered medications for this visit.    Allergies:   Review of patient's allergies indicates no known allergies.    Social History:  The patient  reports that he has quit smoking. He has never used smokeless tobacco. He reports that he drinks alcohol.   Family History:  The patient's family history includes Angina in his mother; Coronary artery disease in his father; Diabetes in his father.    ROS:  Please see the history of present illness.   Otherwise, review of systems are positive for  minimal difficulty with dysarthria. No motor abnormalities exist. He denies dyspnea. No angina..   All other systems are reviewed and negative.    PHYSICAL EXAM: VS:  BP 112/70 mmHg  Pulse 58  Ht 5\' 7"  (1.702 m)  Wt 211 lb 12.8 oz (96.072 kg)  BMI 33.16 kg/m2 , BMI Body mass index is 33.16 kg/(m^2). GEN: Well nourished, well developed, in no acute distress HEENT: normal Neck: no JVD, carotid bruits, or masses Cardiac: RRR; no murmurs, rubs, or gallops,no edema  Respiratory:  clear to auscultation bilaterally, normal work of breathing GI: soft, nontender, nondistended, + BS MS: no deformity or atrophy Skin: warm and dry, no rash Neuro:  Strength and sensation are intact Psych: euthymic mood, full affect   EKG:  EKG is ordered today and reveals sinus bradycardia 50 bpm, leftward axis, and  nonspecific T wave flattening   Recent Labs: 11/29/2014: TSH 2.46 12/30/2014: ALT 17 12/31/2014: BUN 17; Creatinine 1.15; Hemoglobin 12.3*; Platelets 200; Potassium 4.1; Sodium 139    Lipid Panel    Component Value Date/Time   CHOL 121 12/31/2014 0555   TRIG 208* 12/31/2014 0555   HDL 35* 12/31/2014 0555   CHOLHDL 3.5 12/31/2014 0555   VLDL 42* 12/31/2014 0555   LDLCALC 44 12/31/2014 0555      Wt Readings from Last 3 Encounters:  01/12/15 211 lb 12.8 oz (96.072 kg)  12/31/14 216 lb 6.4 oz (98.158 kg)  12/28/14 215 lb 12.8 oz (97.886 kg)      Other studies Reviewed: Additional studies/ records that were reviewed today include: Reviewed neurological evaluation. Review of the above records demonstrates: No significant findings   ASSESSMENT AND PLAN:  Coronary artery disease involving autologous vein coronary bypass graft without angina pectoris: Asymptomatic  Chronic anticoagulation: Embolic TIA 36 hours after holding Xarelto in preparation for coronary angiography.  Chronic systolic heart failure: Asymptomatic  Essential hypertension: Stable  Stroke/TIA off Xarelto. No recurrence with the patient back on medications.  Atrial fibrillation, unspecified - no recurrence since initiation of amiodarone       Current medicines are reviewed at length with the patient today.  The patient has concerns regarding medicines.  The following changes have been made:  On April 25 we will decrease amiodarone to 200 mg daily. At bedtime will also optimize the beta blocker dose from 50-75 mg per day.  Labs/ tests ordered today include:  Orders Placed This Encounter  Procedures  . EKG 12-Lead     Disposition:   FU with his. Cai Flott in 6 weeks   Signed, Sinclair Grooms, MD  01/12/2015 1:03 PM    [facility] [place], [place],   [address] Phone: [phone]; Fax: [phone]

## 2015-01-12 NOTE — Patient Instructions (Signed)
Your physician has recommended you make the following change in your medication:  Amiodarone ( 200 mg ) twice a day sent into [facility] Amiodarone decrease April 25 ( 200 mg ) daily sent into [facility] Increase Toprol ( 75 mg ) daily on April 25   Your physician recommends that you schedule a follow-up appointment in: June 2 with Dr. Tamala Julian.

## 2015-01-13 ENCOUNTER — Other Ambulatory Visit: Payer: Self-pay | Admitting: Interventional Cardiology

## 2015-01-13 ENCOUNTER — Telehealth: Payer: Self-pay

## 2015-01-13 DIAGNOSIS — I480 Paroxysmal atrial fibrillation: Secondary | ICD-10-CM | POA: Diagnosis not present

## 2015-01-13 DIAGNOSIS — I639 Cerebral infarction, unspecified: Secondary | ICD-10-CM | POA: Diagnosis not present

## 2015-01-13 NOTE — Telephone Encounter (Signed)
Pt called about Amiodarone Rx refill. I called pt pharmacy ([facility]) and verified that pt picked up 62 pills of amiodarone on March 25th 2016 and pharmacy put RX sent in on 01/12/2015 on hold. Advised pt of what pharmacy told me and he stated he will call them and see if he can get them to let him pick up new RX. Pt did state he does have his medication and is not out of it.

## 2015-01-18 DIAGNOSIS — K115 Sialolithiasis: Secondary | ICD-10-CM | POA: Diagnosis not present

## 2015-01-19 ENCOUNTER — Telehealth: Payer: Self-pay | Admitting: Interventional Cardiology

## 2015-01-19 NOTE — Telephone Encounter (Signed)
Will get better as amiodarone increases in body.

## 2015-01-19 NOTE — Telephone Encounter (Signed)
Returned pt call. Pt sts that he has has 2 episodes of breakthrough Afib since starting Amiodarone. 1st episode was on 4/816. It woke him up out of his sleep and lasted several hours. He did have a beer that evening with dinner. 2nd episode was last night 4/12. Again it woke him up out of his sleep and lasted several hours. Pt is currently asymptomatic, he believes he is back in NSR. He is taking all medications as prescribed, including Amiodarone 200mg  bid. He wanted Dr.Smith to be aware of his Afib episodes. Adv pt,I will fwd Dr.Smith an update and call back if he has any recommendations. Pt agreeable with plan and verbalized understanding.

## 2015-01-19 NOTE — Telephone Encounter (Signed)
New message      Pt c/o medication issue:  1. Name of Medication: amiodarone 2. How are you currently taking this medication (dosage and times per day)? 200mg  3. Are you having a reaction (difficulty breathing--STAT)? no  4. What is your medication issue?  Pt has had 2 episodes of aflutter/ afib since released from hosp.  Not sure if it is related to the medication or not

## 2015-01-20 NOTE — Telephone Encounter (Signed)
Pt aware of Dr.Smith's response. Episodes of Afib will get better as amiodarone increases in body.pt verbalized understanding.

## 2015-01-21 DIAGNOSIS — L814 Other melanin hyperpigmentation: Secondary | ICD-10-CM | POA: Diagnosis not present

## 2015-01-21 DIAGNOSIS — D485 Neoplasm of uncertain behavior of skin: Secondary | ICD-10-CM | POA: Diagnosis not present

## 2015-01-21 DIAGNOSIS — C44212 Basal cell carcinoma of skin of right ear and external auricular canal: Secondary | ICD-10-CM | POA: Diagnosis not present

## 2015-02-01 ENCOUNTER — Telehealth: Payer: Self-pay | Admitting: Interventional Cardiology

## 2015-02-01 NOTE — Telephone Encounter (Signed)
Records rec via Mail from [facility] pt has apt with Dr.Smith 03/14/15 placed records in chart prep bin.

## 2015-02-02 DIAGNOSIS — I6930 Unspecified sequelae of cerebral infarction: Secondary | ICD-10-CM | POA: Diagnosis not present

## 2015-02-09 DIAGNOSIS — C44319 Basal cell carcinoma of skin of other parts of face: Secondary | ICD-10-CM | POA: Diagnosis not present

## 2015-02-24 DIAGNOSIS — D225 Melanocytic nevi of trunk: Secondary | ICD-10-CM | POA: Diagnosis not present

## 2015-02-24 DIAGNOSIS — L821 Other seborrheic keratosis: Secondary | ICD-10-CM | POA: Diagnosis not present

## 2015-02-24 DIAGNOSIS — Z85828 Personal history of other malignant neoplasm of skin: Secondary | ICD-10-CM | POA: Diagnosis not present

## 2015-02-24 DIAGNOSIS — D1801 Hemangioma of skin and subcutaneous tissue: Secondary | ICD-10-CM | POA: Diagnosis not present

## 2015-02-24 DIAGNOSIS — L814 Other melanin hyperpigmentation: Secondary | ICD-10-CM | POA: Diagnosis not present

## 2015-03-10 ENCOUNTER — Ambulatory Visit (INDEPENDENT_AMBULATORY_CARE_PROVIDER_SITE_OTHER): Payer: Medicare Other | Admitting: Interventional Cardiology

## 2015-03-10 ENCOUNTER — Encounter: Payer: Self-pay | Admitting: Interventional Cardiology

## 2015-03-10 VITALS — BP 98/58 | HR 57 | Ht 67.0 in | Wt 209.0 lb

## 2015-03-10 DIAGNOSIS — Z79899 Other long term (current) drug therapy: Secondary | ICD-10-CM | POA: Insufficient documentation

## 2015-03-10 DIAGNOSIS — I10 Essential (primary) hypertension: Secondary | ICD-10-CM | POA: Diagnosis not present

## 2015-03-10 DIAGNOSIS — I25810 Atherosclerosis of coronary artery bypass graft(s) without angina pectoris: Secondary | ICD-10-CM | POA: Diagnosis not present

## 2015-03-10 DIAGNOSIS — I480 Paroxysmal atrial fibrillation: Secondary | ICD-10-CM

## 2015-03-10 DIAGNOSIS — Z7901 Long term (current) use of anticoagulants: Secondary | ICD-10-CM | POA: Diagnosis not present

## 2015-03-10 DIAGNOSIS — G459 Transient cerebral ischemic attack, unspecified: Secondary | ICD-10-CM

## 2015-03-10 DIAGNOSIS — I5022 Chronic systolic (congestive) heart failure: Secondary | ICD-10-CM

## 2015-03-10 NOTE — Progress Notes (Signed)
Cardiology Office Note   Date:  03/10/2015   ID:  Shelia Magallon, DOB 08-Apr-1946, MRN [id number]  PCP:  Irven Shelling, MD  Cardiologist:  Sinclair Grooms, MD   Chief Complaint  Patient presents with  . CORONARY ARTERY DISEASE    AUTOLOGOUS VEIN BYPASS GRAFT      History of Present Illness: Walter [name] is a 69 y.o. male who presents for paroxysmal atrial fibrillation, recent embolic stroke during anticoagulation discontinuation for coronary angio, coronary artery disease with prior coronary artery bypass grafting using left internal mammary artery to LAD, free right internal mammary to obtuse marginal, and saphenous vein graft to diagonal.  Shortness of breath is improved. He has some lightheadedness or dizziness occasionally when he stands. He has not had angina since surgery in 2004. He has been having exertional dyspnea. This is improved since up titrating beta blocker therapy. He denies orthopnea. Does no claudication. For the past 3 weeks he has had no episodes of tachycardia or palpitation. No side effect to the current dose of amiodarone, 400 mg daily. In retrospect he feels he was having very prolonged episodes of tachycardia intermittently almost every day prior to Eliquis discontinuation for coronary angioma  Past Medical History  Diagnosis Date  . Chronic kidney disease   . Pleural effusion   . Abdominal pain   . Gum disease   . Colon polyp   . Diabetes mellitus   . Cancer     prostate    Past Surgical History  Procedure Laterality Date  . Coronary artery bypass graft    . Colon surgery    . Cataract extraction  2006 and 2007    bilateral  . Colonoscopy       Current Outpatient Prescriptions  Medication Sig Dispense Refill  . amiodarone (PACERONE) 200 MG tablet Take 1 tablet (200 mg total) by mouth daily. 90 tablet 2  . buPROPion (WELLBUTRIN XL) 150 MG 24 hr tablet Take 150 mg by mouth daily.  0  . escitalopram (LEXAPRO) 20 MG tablet Take 20 mg by  mouth daily.      Marland Kitchen HUMALOG KWIKPEN 100 UNIT/ML KiwkPen Inject 14-15 Units into the skin daily.   1  . insulin detemir (LEVEMIR) 100 UNIT/ML injection Inject 80 Units into the skin at bedtime.     Marland Kitchen JANUMET XR 50-1000 MG TB24 Take 1 tablet by mouth 2 (two) times daily.     Marland Kitchen lisinopril (PRINIVIL,ZESTRIL) 20 MG tablet TAKE 1 TABLET DAILY 90 tablet 0  . metoprolol succinate (TOPROL-XL) 50 MG 24 hr tablet Take 75 mg by mouth daily. Take with or immediately following a meal.    . Omega-3-acid Ethyl Esters (LOVAZA PO) Take 1 tablet by mouth 2 (two) times daily.     . rosuvastatin (CRESTOR) 10 MG tablet Take 10 mg by mouth at bedtime.     . TOVIAZ 4 MG TB24 tablet Take 4 mg by mouth daily.    Marland Kitchen VIAGRA 100 MG tablet Take 1 tablet by mouth daily as needed for erectile dysfunction. ED    . XARELTO 20 MG TABS tablet TAKE 1 TABLET DAILY 90 tablet 1   No current facility-administered medications for this visit.    Allergies:   Review of patient's allergies indicates no known allergies.    Social History:  The patient  reports that he has quit smoking. He has never used smokeless tobacco. He reports that he drinks alcohol.   Family History:  The patient's family history  includes Angina in his mother; Coronary artery disease in his father; Diabetes in his father.    ROS:  Please see the history of present illness.   Otherwise, review of systems are positive for dyspnea on exertion, orthostatic dizziness, easy bruising..   All other systems are reviewed and negative.    PHYSICAL EXAM: VS:  BP 98/58 mmHg  Pulse 57  Ht 5\' 7"  (1.702 m)  Wt 209 lb (94.802 kg)  BMI 32.73 kg/m2  SpO2 95% , BMI Body mass index is 32.73 kg/(m^2). GEN: Well nourished, well developed, in no acute distress HEENT: normal Neck: no JVD, carotid bruits, or masses Cardiac: RRR; no murmurs, rubs, or gallops,no edema  Respiratory:  clear to auscultation bilaterally, normal work of breathing GI: soft, nontender, nondistended, +  BS MS: no deformity or atrophy Skin: warm and dry, no rash Neuro:  Strength and sensation are intact Psych: euthymic mood, full affect   EKG:  EKG is not ordered today.    Recent Labs: 11/29/2014: TSH 2.46 12/30/2014: ALT 17 12/31/2014: BUN 17; Creatinine 1.15; Hemoglobin 12.3*; Platelets 200; Potassium 4.1; Sodium 139    Lipid Panel    Component Value Date/Time   CHOL 121 12/31/2014 0555   TRIG 208* 12/31/2014 0555   HDL 35* 12/31/2014 0555   CHOLHDL 3.5 12/31/2014 0555   VLDL 42* 12/31/2014 0555   LDLCALC 44 12/31/2014 0555      Wt Readings from Last 3 Encounters:  03/10/15 209 lb (94.802 kg)  01/12/15 211 lb 12.8 oz (96.072 kg)  12/31/14 216 lb 6.4 oz (98.158 kg)      Other studies Reviewed: Additional studies/ records that were reviewed today include: Records from [facility]. Review of the above records demonstrates: Data documented that he had EF of 50% by TEE post op.   ASSESSMENT AND PLAN:  Coronary artery disease involving autologous vein coronary bypass graft without angina pectoris: stable with no angina  Chronic anticoagulation- No bleeding on Eliquis  Chronic systolic heart failure - perhaps systolic dysfunction was related to atrial fib and tachycardia. LVEF at the time of bypass surgery was 50% as documented from data obtained from [facility] Plan: Echocardiogram  Essential hypertension- controlled  Paroxysmal atrial fibrillation- regular and clinically in sinus rhythm today on amiodarone therapy  Transient cerebral ischemia, unspecified transient cerebral ischemia type     Current medicines are reviewed at length with the patient today.  The patient does not have concerns regarding medicines.  The following changes have been made:  no change.  The overall plan will be to reassess LV function now on optimized medical therapy for systolic dysfunction. He is  reluctant to consider coronary angiography. It is possible that tachycardia has caused a decrease in LV function. His LVEF postop was 50%. The most recent echo EF was 35-40%. This is not a dramatic change especially in absence of anginal symptoms. We will plan to repeat his echo in approximately 6-8 weeks on optimal medical therapy, and if still clearly in the 35% a lesser range, we will proceed with coronary angiography. I may have to convince the patient to do that.  Labs/ tests ordered today include:  Orders Placed This Encounter  Procedures  . Echocardiogram     Disposition:   FU with HS in 2 months  Signed, Sinclair Grooms, MD  03/10/2015 1:04 PM    [address]  [place], [place], [place]  [address]  Phone: [phone]; Fax: [phone]

## 2015-03-10 NOTE — Patient Instructions (Signed)
Medication Instructions:  Your physician recommends that you continue on your current medications as directed. Please refer to the Current Medication list given to you today.   Labwork: None   Testing/Procedures: Your physician has requested that you have an echocardiogram. Echocardiography is a painless test that uses sound waves to create images of your heart. It provides your doctor with information about the size and shape of your heart and how well your heart's chambers and valves are working. This procedure takes approximately one hour. There are no restrictions for this procedure. ( To be scheduled in 8 weeks)   Follow-Up: Your physician recommends that you schedule a follow-up appointment in: 10 weeks with Dr.Smith   Any Other Special Instructions Will Be Listed Below (If Applicable).

## 2015-04-06 DIAGNOSIS — N182 Chronic kidney disease, stage 2 (mild): Secondary | ICD-10-CM | POA: Diagnosis not present

## 2015-04-06 DIAGNOSIS — E1122 Type 2 diabetes mellitus with diabetic chronic kidney disease: Secondary | ICD-10-CM | POA: Diagnosis not present

## 2015-04-12 DIAGNOSIS — D2311 Other benign neoplasm of skin of right eyelid, including canthus: Secondary | ICD-10-CM | POA: Diagnosis not present

## 2015-04-12 DIAGNOSIS — H43811 Vitreous degeneration, right eye: Secondary | ICD-10-CM | POA: Diagnosis not present

## 2015-04-12 DIAGNOSIS — E119 Type 2 diabetes mellitus without complications: Secondary | ICD-10-CM | POA: Diagnosis not present

## 2015-04-12 DIAGNOSIS — H52203 Unspecified astigmatism, bilateral: Secondary | ICD-10-CM | POA: Diagnosis not present

## 2015-04-14 ENCOUNTER — Other Ambulatory Visit: Payer: Self-pay

## 2015-04-14 ENCOUNTER — Other Ambulatory Visit: Payer: Self-pay | Admitting: Interventional Cardiology

## 2015-04-14 MED ORDER — LISINOPRIL 20 MG PO TABS: 20.0000 mg | ORAL_TABLET | Freq: Every day | ORAL | Status: DC

## 2015-05-05 ENCOUNTER — Other Ambulatory Visit (HOSPITAL_COMMUNITY): Payer: Medicare Other

## 2015-05-10 ENCOUNTER — Ambulatory Visit (HOSPITAL_COMMUNITY): Payer: Medicare Other | Attending: Interventional Cardiology

## 2015-05-10 ENCOUNTER — Other Ambulatory Visit: Payer: Self-pay

## 2015-05-10 DIAGNOSIS — E669 Obesity, unspecified: Secondary | ICD-10-CM | POA: Diagnosis not present

## 2015-05-10 DIAGNOSIS — Z6832 Body mass index (BMI) 32.0-32.9, adult: Secondary | ICD-10-CM | POA: Diagnosis not present

## 2015-05-10 DIAGNOSIS — I509 Heart failure, unspecified: Secondary | ICD-10-CM | POA: Diagnosis present

## 2015-05-10 DIAGNOSIS — I071 Rheumatic tricuspid insufficiency: Secondary | ICD-10-CM | POA: Insufficient documentation

## 2015-05-10 DIAGNOSIS — E785 Hyperlipidemia, unspecified: Secondary | ICD-10-CM | POA: Insufficient documentation

## 2015-05-10 DIAGNOSIS — I10 Essential (primary) hypertension: Secondary | ICD-10-CM | POA: Insufficient documentation

## 2015-05-10 DIAGNOSIS — I5022 Chronic systolic (congestive) heart failure: Secondary | ICD-10-CM | POA: Diagnosis not present

## 2015-05-10 DIAGNOSIS — E119 Type 2 diabetes mellitus without complications: Secondary | ICD-10-CM | POA: Diagnosis not present

## 2015-05-27 ENCOUNTER — Telehealth: Payer: Self-pay

## 2015-05-27 NOTE — Telephone Encounter (Signed)
Called to give pt echo results. Left message with pt wife to call back.  The echocardiogram demonstrates that LV function is now back into the normal range. Ejection fraction is 55%. The decrease in LV function was likely related to atrial fibrillation overworking the heart. I have no real desire to pursue coronary angiography. We will discuss at his next office visit.

## 2015-05-27 NOTE — Telephone Encounter (Signed)
Pt aware of echo results. The echocardiogram demonstrates that LV function is now back into the normal range. Ejection fraction is 55%. The decrease in LV function was likely related to atrial fibrillation overworking the heart. I have no real desire to pursue coronary angiography. We will discuss at his next office visit.   Pt appreciative for the call and verbalized under standing.

## 2015-06-07 NOTE — Progress Notes (Signed)
Cardiology Office Note   Date:  06/08/2015   ID:  Leone, Putman Apr 30, 1946, MRN [id number]  PCP:  Irven Shelling, MD  Cardiologist:  Sinclair Grooms, MD   Chief Complaint  Patient presents with  . Coronary Artery Disease      History of Present Illness: Walter [name] is a 69 y.o. male who presents for CAD with CABG, TIA, anticoagulation, essential hypertension, DM II, and chronic systolic heart failure.  The patient feels well. He has had minimal palpitations. No chest discomfort. No peripheral edema. He has not had syncope. LV function has improved with control of atrial fibrillation rhythm  Past Medical History  Diagnosis Date  . Chronic kidney disease   . Pleural effusion   . Abdominal pain   . Gum disease   . Colon polyp   . Diabetes mellitus   . Cancer     prostate    Past Surgical History  Procedure Laterality Date  . Coronary artery bypass graft    . Colon surgery    . Cataract extraction  2006 and 2007    bilateral  . Colonoscopy       Current Outpatient Prescriptions  Medication Sig Dispense Refill  . amiodarone (PACERONE) 200 MG tablet Take 1 tablet (200 mg total) by mouth daily. 90 tablet 2  . buPROPion (WELLBUTRIN XL) 150 MG 24 hr tablet Take 150 mg by mouth daily.  0  . escitalopram (LEXAPRO) 20 MG tablet Take 20 mg by mouth daily.      Marland Kitchen HUMALOG KWIKPEN 100 UNIT/ML KiwkPen Inject 14-15 Units into the skin daily.   1  . insulin detemir (LEVEMIR) 100 UNIT/ML injection Inject 80 Units into the skin at bedtime.     Marland Kitchen JANUMET XR 50-1000 MG TB24 Take 1 tablet by mouth 2 (two) times daily.     Marland Kitchen lisinopril (PRINIVIL,ZESTRIL) 20 MG tablet Take 1 tablet (20 mg total) by mouth daily. 90 tablet 3  . LOVAZA 1 G capsule Take 1 g by mouth daily.     . metoprolol succinate (TOPROL-XL) 50 MG 24 hr tablet Take 75 mg by mouth daily. Take with or immediately following a meal.    . Omega-3-acid Ethyl Esters (LOVAZA PO) Take 1 tablet by mouth 2 (two) times  daily.     . rosuvastatin (CRESTOR) 10 MG tablet Take 10 mg by mouth at bedtime.     . TOVIAZ 4 MG TB24 tablet Take 4 mg by mouth daily.    Marland Kitchen VIAGRA 100 MG tablet Take 1 tablet by mouth daily as needed for erectile dysfunction. ED    . XARELTO 20 MG TABS tablet Take 20 mg by mouth daily with supper.      No current facility-administered medications for this visit.    Allergies:   Review of patient's allergies indicates no known allergies.    Social History:  The patient  reports that he has quit smoking. He has never used smokeless tobacco. He reports that he drinks alcohol.   Family History:  The patient's family history includes Angina in his mother; Coronary artery disease in his father; Diabetes in his father.    ROS:  Please see the history of present illness.   Otherwise, review of systems are positive for none.   All other systems are reviewed and negative.    PHYSICAL EXAM: VS:  BP 98/60 mmHg  Pulse 60  Ht 5\' 7"  (1.702 m)  Wt 93.441 kg (206 lb)  BMI 32.26  kg/m2 , BMI Body mass index is 32.26 kg/(m^2). GEN: Well nourished, well developed, in no acute distress HEENT: normal Neck: no JVD, carotid bruits, or masses Cardiac: RRR; no murmurs, rubs, or gallops,no edema  Respiratory:  clear to auscultation bilaterally, normal work of breathing GI: soft, nontender, nondistended, + BS MS: no deformity or atrophy Skin: warm and dry, no rash Neuro:  Strength and sensation are intact Psych: euthymic mood, full affect   EKG:  EKG is not ordered today.    Recent Labs: 11/29/2014: TSH 2.46 12/30/2014: ALT 17 12/31/2014: BUN 17; Creatinine, Ser 1.15; Hemoglobin 12.3*; Platelets 200; Potassium 4.1; Sodium 139    Lipid Panel    Component Value Date/Time   CHOL 121 12/31/2014 0555   TRIG 208* 12/31/2014 0555   HDL 35* 12/31/2014 0555   CHOLHDL 3.5 12/31/2014 0555   VLDL 42* 12/31/2014 0555   LDLCALC 44 12/31/2014 0555      Wt Readings from Last 3 Encounters:  06/08/15  93.441 kg (206 lb)  03/10/15 94.802 kg (209 lb)  01/12/15 96.072 kg (211 lb 12.8 oz)      Other studies Reviewed: Additional studies/ records that were reviewed today include:    ASSESSMENT AND PLAN:  1. 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  2. Chronic systolic heart failure LV function has improved back into the normal systolic function range. Therefore the decreased EF was likely related to tachycardia which is being controlled since rhythm is stable normal sinus on amiodarone.  3. Essential hypertension Low-normal, with repeated blood pressure 108/70. We will keep an eye on blood pressure and may need to back off beta blocker therapy or Ace to some degree to allow a slightly higher blood pressure.  4. Paroxysmal atrial fibrillation Rhythm control on amiodarone  5. Transient cerebral ischemia, unspecified transient cerebral ischemia type No recurrence since starting oral anticoagulation  6. On amiodarone therapy We will check a TSH and hepatic panel today.  7. Chronic anticoagulation No bleeding on Xarelto.    Current medicines are reviewed at length with the patient today.  The patient does not have concerns regarding medicines.  The following changes have been made:  We rediscussed the implications of improved LV function by recent echo. No limitations in physical activity.  Labs/ tests ordered today include: In 6 months the patient will have the testing noted below.   Orders Placed This Encounter  Procedures  . DG Chest 2 View  . TSH  . Hepatic function panel     Disposition:   FU with HS in 6 months  Signed, Sinclair Grooms, MD  06/08/2015 11:16 AM    [facility] [address], [place], [place]  [address] Phone: [phone]; Fax: [phone]

## 2015-06-08 ENCOUNTER — Encounter: Payer: Self-pay | Admitting: Interventional Cardiology

## 2015-06-08 ENCOUNTER — Ambulatory Visit (INDEPENDENT_AMBULATORY_CARE_PROVIDER_SITE_OTHER): Payer: Medicare Other | Admitting: Interventional Cardiology

## 2015-06-08 VITALS — BP 98/60 | HR 60 | Ht 67.0 in | Wt 206.0 lb

## 2015-06-08 DIAGNOSIS — I5022 Chronic systolic (congestive) heart failure: Secondary | ICD-10-CM

## 2015-06-08 DIAGNOSIS — I10 Essential (primary) hypertension: Secondary | ICD-10-CM | POA: Diagnosis not present

## 2015-06-08 DIAGNOSIS — I25810 Atherosclerosis of coronary artery bypass graft(s) without angina pectoris: Secondary | ICD-10-CM

## 2015-06-08 DIAGNOSIS — I639 Cerebral infarction, unspecified: Secondary | ICD-10-CM

## 2015-06-08 DIAGNOSIS — I480 Paroxysmal atrial fibrillation: Secondary | ICD-10-CM

## 2015-06-08 DIAGNOSIS — G459 Transient cerebral ischemic attack, unspecified: Secondary | ICD-10-CM

## 2015-06-08 DIAGNOSIS — Z7901 Long term (current) use of anticoagulants: Secondary | ICD-10-CM

## 2015-06-08 DIAGNOSIS — Z79899 Other long term (current) drug therapy: Secondary | ICD-10-CM

## 2015-06-08 DIAGNOSIS — A09 Infectious gastroenteritis and colitis, unspecified: Secondary | ICD-10-CM | POA: Diagnosis not present

## 2015-06-08 LAB — HEPATIC FUNCTION PANEL
ALK PHOS: 41 U/L (ref 39–117)
ALT: 26 U/L (ref 0–53)
AST: 23 U/L (ref 0–37)
Albumin: 4.4 g/dL (ref 3.5–5.2)
BILIRUBIN DIRECT: 0.2 mg/dL (ref 0.0–0.3)
BILIRUBIN TOTAL: 0.8 mg/dL (ref 0.2–1.2)
Total Protein: 7.8 g/dL (ref 6.0–8.3)

## 2015-06-08 LAB — TSH: TSH: 5.16 u[IU]/mL — ABNORMAL HIGH (ref 0.35–4.50)

## 2015-06-08 NOTE — Patient Instructions (Addendum)
Medication Instructions:  Your physician recommends that you continue on your current medications as directed. Please refer to the Current Medication list given to you today.   Labwork: Tsh, Hepatic today  Testing/Procedures: A chest x-ray takes a picture of the organs and structures inside the chest, including the heart, lungs, and blood vessels. This test can show several things, including, whether the heart is enlarges; whether fluid is building up in the lungs; and whether pacemaker / defibrillator leads are still in place.(To be performed at [place] FEB 2017)  Follow-Up: Your physician wants you to follow-up in: 6 months with Dr.Smith You will receive a reminder letter in the mail two months in advance. If you don't receive a letter, please call our office to schedule the follow-up appointment.   Any Other Special Instructions Will Be Listed Below (If Applicable).

## 2015-06-09 ENCOUNTER — Telehealth: Payer: Self-pay

## 2015-06-09 ENCOUNTER — Other Ambulatory Visit: Payer: Self-pay

## 2015-06-09 DIAGNOSIS — I480 Paroxysmal atrial fibrillation: Secondary | ICD-10-CM

## 2015-06-09 MED ORDER — METOPROLOL SUCCINATE ER 50 MG PO TB24: ORAL_TABLET | ORAL | Status: DC

## 2015-06-09 NOTE — Telephone Encounter (Signed)
Informed patient of results and verbal understanding expressed.  TSH scheduled for October 5. Patient agrees with treatment plan.

## 2015-06-09 NOTE — Telephone Encounter (Signed)
-----   Message from Belva Crome, MD sent at 06/08/2015  7:52 PM EDT ----- Thyroid may be underfunctioning. Recheck TSH in 1 month. Liver is fine.

## 2015-06-09 NOTE — Telephone Encounter (Signed)
Walter Crome, MD at 06/07/2015 10:46 PM   metoprolol succinate (TOPROL-XL) 50 MG 24 hr tablet Take 75 mg by mouth daily. Take with or immediately following a meal

## 2015-06-15 DIAGNOSIS — N182 Chronic kidney disease, stage 2 (mild): Secondary | ICD-10-CM | POA: Diagnosis not present

## 2015-06-15 DIAGNOSIS — Z794 Long term (current) use of insulin: Secondary | ICD-10-CM | POA: Diagnosis not present

## 2015-06-15 DIAGNOSIS — E1122 Type 2 diabetes mellitus with diabetic chronic kidney disease: Secondary | ICD-10-CM | POA: Diagnosis not present

## 2015-06-23 ENCOUNTER — Other Ambulatory Visit: Payer: Self-pay | Admitting: Interventional Cardiology

## 2015-07-13 ENCOUNTER — Other Ambulatory Visit: Payer: Medicare Other

## 2015-07-14 ENCOUNTER — Other Ambulatory Visit (INDEPENDENT_AMBULATORY_CARE_PROVIDER_SITE_OTHER): Payer: Medicare Other | Admitting: *Deleted

## 2015-07-14 DIAGNOSIS — I10 Essential (primary) hypertension: Secondary | ICD-10-CM

## 2015-07-14 LAB — TSH: TSH: 4.581 u[IU]/mL — ABNORMAL HIGH (ref 0.350–4.500)

## 2015-07-19 ENCOUNTER — Telehealth: Payer: Self-pay

## 2015-07-19 DIAGNOSIS — Z79899 Other long term (current) drug therapy: Secondary | ICD-10-CM

## 2015-07-19 NOTE — Telephone Encounter (Signed)
-----   Message from Belva Crome, MD sent at 07/18/2015  5:14 PM EDT ----- Prior to next OV in approximately 4 months

## 2015-07-19 NOTE — Telephone Encounter (Signed)
Pt aware of lab results. Thyroid improved. Lab should be repeated prior to next OV in approximately 4 months. Pt verbalized understanding.

## 2015-07-22 DIAGNOSIS — Z23 Encounter for immunization: Secondary | ICD-10-CM | POA: Diagnosis not present

## 2015-08-29 DIAGNOSIS — Z794 Long term (current) use of insulin: Secondary | ICD-10-CM | POA: Diagnosis not present

## 2015-08-29 DIAGNOSIS — E1122 Type 2 diabetes mellitus with diabetic chronic kidney disease: Secondary | ICD-10-CM | POA: Diagnosis not present

## 2015-08-29 DIAGNOSIS — N182 Chronic kidney disease, stage 2 (mild): Secondary | ICD-10-CM | POA: Diagnosis not present

## 2015-09-19 ENCOUNTER — Other Ambulatory Visit: Payer: Self-pay | Admitting: Interventional Cardiology

## 2015-10-03 ENCOUNTER — Other Ambulatory Visit: Payer: Self-pay | Admitting: Cardiology

## 2015-10-03 DIAGNOSIS — I4891 Unspecified atrial fibrillation: Secondary | ICD-10-CM

## 2015-10-03 MED ORDER — AMIODARONE HCL 200 MG PO TABS: 200.0000 mg | ORAL_TABLET | Freq: Every day | ORAL | Status: DC

## 2015-10-05 ENCOUNTER — Telehealth: Payer: Self-pay | Admitting: Interventional Cardiology

## 2015-10-05 MED ORDER — RIVAROXABAN 20 MG PO TABS: 20.0000 mg | ORAL_TABLET | Freq: Every day | ORAL | Status: DC

## 2015-10-05 NOTE — Telephone Encounter (Signed)
Pt calling requesting a 15 day supply of Xarelto 20 mg tablet, because is out of town on vacation and run our of his medication. Sent Rx to the pt's pharmacy as requested, [place]. Confirmation received.

## 2015-10-12 DIAGNOSIS — C61 Malignant neoplasm of prostate: Secondary | ICD-10-CM | POA: Diagnosis not present

## 2015-10-12 DIAGNOSIS — N5235 Erectile dysfunction following radiation therapy: Secondary | ICD-10-CM | POA: Diagnosis not present

## 2015-10-12 DIAGNOSIS — R3915 Urgency of urination: Secondary | ICD-10-CM | POA: Diagnosis not present

## 2015-10-12 DIAGNOSIS — Z0000 Encounter for general adult medical examination without abnormal findings: Secondary | ICD-10-CM | POA: Diagnosis not present

## 2015-10-20 DIAGNOSIS — J329 Chronic sinusitis, unspecified: Secondary | ICD-10-CM | POA: Diagnosis not present

## 2015-10-20 DIAGNOSIS — R062 Wheezing: Secondary | ICD-10-CM | POA: Diagnosis not present

## 2015-10-20 DIAGNOSIS — J069 Acute upper respiratory infection, unspecified: Secondary | ICD-10-CM | POA: Diagnosis not present

## 2015-11-25 ENCOUNTER — Other Ambulatory Visit: Payer: Self-pay | Admitting: Interventional Cardiology

## 2015-12-01 DIAGNOSIS — Z79899 Other long term (current) drug therapy: Secondary | ICD-10-CM | POA: Diagnosis not present

## 2015-12-01 DIAGNOSIS — N182 Chronic kidney disease, stage 2 (mild): Secondary | ICD-10-CM | POA: Diagnosis not present

## 2015-12-01 DIAGNOSIS — Z5181 Encounter for therapeutic drug level monitoring: Secondary | ICD-10-CM | POA: Diagnosis not present

## 2015-12-01 DIAGNOSIS — Z794 Long term (current) use of insulin: Secondary | ICD-10-CM | POA: Diagnosis not present

## 2015-12-01 DIAGNOSIS — E1122 Type 2 diabetes mellitus with diabetic chronic kidney disease: Secondary | ICD-10-CM | POA: Diagnosis not present

## 2015-12-09 DIAGNOSIS — Z7984 Long term (current) use of oral hypoglycemic drugs: Secondary | ICD-10-CM | POA: Diagnosis not present

## 2015-12-09 DIAGNOSIS — Z794 Long term (current) use of insulin: Secondary | ICD-10-CM | POA: Diagnosis not present

## 2015-12-09 DIAGNOSIS — E1121 Type 2 diabetes mellitus with diabetic nephropathy: Secondary | ICD-10-CM | POA: Diagnosis not present

## 2015-12-22 ENCOUNTER — Ambulatory Visit
Admission: RE | Admit: 2015-12-22 | Discharge: 2015-12-22 | Disposition: A | Discharge status: Home / Self Care | Payer: Medicare Other | Source: Ambulatory Visit | Attending: Interventional Cardiology | Admitting: Interventional Cardiology

## 2015-12-22 ENCOUNTER — Ambulatory Visit (INDEPENDENT_AMBULATORY_CARE_PROVIDER_SITE_OTHER): Payer: Medicare Other | Admitting: Interventional Cardiology

## 2015-12-22 ENCOUNTER — Encounter: Payer: Self-pay | Admitting: Interventional Cardiology

## 2015-12-22 VITALS — BP 130/64 | HR 57 | Ht 67.0 in | Wt 215.2 lb

## 2015-12-22 DIAGNOSIS — I25810 Atherosclerosis of coronary artery bypass graft(s) without angina pectoris: Secondary | ICD-10-CM

## 2015-12-22 DIAGNOSIS — Z7901 Long term (current) use of anticoagulants: Secondary | ICD-10-CM | POA: Diagnosis not present

## 2015-12-22 DIAGNOSIS — Z79899 Other long term (current) drug therapy: Secondary | ICD-10-CM | POA: Diagnosis not present

## 2015-12-22 DIAGNOSIS — I480 Paroxysmal atrial fibrillation: Secondary | ICD-10-CM | POA: Diagnosis not present

## 2015-12-22 DIAGNOSIS — R269 Unspecified abnormalities of gait and mobility: Secondary | ICD-10-CM

## 2015-12-22 DIAGNOSIS — I10 Essential (primary) hypertension: Secondary | ICD-10-CM

## 2015-12-22 DIAGNOSIS — R4189 Other symptoms and signs involving cognitive functions and awareness: Secondary | ICD-10-CM

## 2015-12-22 DIAGNOSIS — G459 Transient cerebral ischemic attack, unspecified: Secondary | ICD-10-CM

## 2015-12-22 LAB — HEPATIC FUNCTION PANEL
ALBUMIN: 3.8 g/dL (ref 3.6–5.1)
ALK PHOS: 35 U/L — AB (ref 40–115)
ALT: 41 U/L (ref 9–46)
AST: 42 U/L — AB (ref 10–35)
BILIRUBIN DIRECT: 0.1 mg/dL (ref <=0.2)
BILIRUBIN INDIRECT: 0.6 mg/dL (ref 0.2–1.2)
BILIRUBIN TOTAL: 0.7 mg/dL (ref 0.2–1.2)
Total Protein: 6.6 g/dL (ref 6.1–8.1)

## 2015-12-22 LAB — TSH: TSH: 2.68 mIU/L (ref 0.40–4.50)

## 2015-12-22 NOTE — Patient Instructions (Signed)
Medication Instructions:  Your physician recommends that you continue on your current medications as directed. Please refer to the Current Medication list given to you today.   Labwork: Tsh, Hepatic today  Testing/Procedures: A chest x-ray takes a picture of the organs and structures inside the chest, including the heart, lungs, and blood vessels. This test can show several things, including, whether the heart is enlarges; whether fluid is building up in the lungs; and whether pacemaker / defibrillator leads are still in place. (To be done at [address]  Follow-Up: Your physician wants you to follow-up in: 6 months with Dr.Smith You will receive a reminder letter in the mail two months in advance. If you don't receive a letter, please call our office to schedule the follow-up appointment.   Any Other Special Instructions Will Be Listed Below (If Applicable).     If you need a refill on your cardiac medications before your next appointment, please call your pharmacy.

## 2015-12-22 NOTE — Progress Notes (Signed)
Cardiology Office Note   Date:  12/22/2015   ID:  Walter [name], DOB 1946/02/04, MRN [id number]  PCP:  Irven Shelling, MD  Cardiologist:  Sinclair Grooms, MD   Chief Complaint  Patient presents with  . Atrial Fibrillation      History of Present Illness: Walter [name] is a 70 y.o. male who presents for paroxysmal atrial fibrillation, CAD, hypertension, CK 2, chronic anticoagulation therapy, and amiodarone therapy.  Feels he is having difficulty with balance. He has not had chest pain. He worries that he is not completely recovered from his stroke. He denies syncope. No episodes of orthopnea, palpitations, or atrial fibrillation.  Past Medical History  Diagnosis Date  . Chronic kidney disease   . Pleural effusion   . Abdominal pain   . Gum disease   . Colon polyp   . Diabetes mellitus   . Cancer [facility])     prostate    Past Surgical History  Procedure Laterality Date  . Coronary artery bypass graft    . Colon surgery    . Cataract extraction  2006 and 2007    bilateral  . Colonoscopy       Current Outpatient Prescriptions  Medication Sig Dispense Refill  . amiodarone (PACERONE) 200 MG tablet Take 1 tablet (200 mg total) by mouth daily. 90 tablet 2  . buPROPion (WELLBUTRIN XL) 150 MG 24 hr tablet Take 150 mg by mouth daily.  0  . escitalopram (LEXAPRO) 20 MG tablet Take 20 mg by mouth daily.      Marland Kitchen JANUMET XR 50-1000 MG TB24 Take 1 tablet by mouth 2 (two) times daily.     Marland Kitchen lisinopril (PRINIVIL,ZESTRIL) 20 MG tablet Take 1 tablet (20 mg total) by mouth daily. 90 tablet 3  . metoprolol succinate (TOPROL-XL) 50 MG 24 hr tablet Take 75 mg by mouth daily. Take with or immediately following a meal 135 tablet 0  . Omega-3-acid Ethyl Esters (LOVAZA PO) Take 1 tablet by mouth 2 (two) times daily.     . rivaroxaban (XARELTO) 20 MG TABS tablet Take 1 tablet (20 mg total) by mouth daily. 15 tablet 0  . rosuvastatin (CRESTOR) 10 MG tablet Take 10 mg by mouth at bedtime.       . TOVIAZ 4 MG TB24 tablet Take 4 mg by mouth daily.    . TRESIBA FLEXTOUCH 200 UNIT/ML SOPN Inject 80 Units into the skin daily.    Marland Kitchen VIAGRA 100 MG tablet Take 1 tablet by mouth daily as needed for erectile dysfunction. ED     No current facility-administered medications for this visit.    Allergies:   Review of patient's allergies indicates no known allergies.    Social History:  The patient  reports that he has quit smoking. He has never used smokeless tobacco. He reports that he drinks alcohol.   Family History:  The patient's family history includes Angina in his mother; Coronary artery disease in his father; Diabetes in his father.    ROS:  Please see the history of present illness.   Otherwise, review of systems are positive for cough, diarrhea, occasional wheezing. No bleeding on rivaroxaban. Does feel depressed, low back discomfort, dizziness, difficulty with balance and walking..   All other systems are reviewed and negative.    PHYSICAL EXAM: VS:  BP 130/64 mmHg  Pulse 57  Ht 5\' 7"  (1.702 m)  Wt 215 lb 3.2 oz (97.614 kg)  BMI 33.70 kg/m2 , BMI Body mass index  is 33.7 kg/(m^2). GEN: Well nourished, well developed, in no acute distress HEENT: normal Neck: no JVD, carotid bruits, or masses Cardiac: RRR.  There is no murmur, rub, or gallop. There is no edema. Respiratory:  clear to auscultation bilaterally, normal work of breathing. GI: soft, nontender, nondistended, + BS MS: no deformity or atrophy Skin: warm and dry, no rash Neuro:  Strength and sensation are intact Psych: euthymic mood, full affect   EKG:  EKG is not ordered today.     Recent Labs: 12/31/2014: BUN 17; Creatinine, Ser 1.15; Hemoglobin 12.3*; Platelets 200; Potassium 4.1; Sodium 139 06/08/2015: ALT 26 07/14/2015: TSH 4.581*    Lipid Panel    Component Value Date/Time   CHOL 121 12/31/2014 0555   TRIG 208* 12/31/2014 0555   HDL 35* 12/31/2014 0555   CHOLHDL 3.5 12/31/2014 0555   VLDL 42*  12/31/2014 0555   LDLCALC 44 12/31/2014 0555      Wt Readings from Last 3 Encounters:  12/22/15 215 lb 3.2 oz (97.614 kg)  06/08/15 206 lb (93.441 kg)  03/10/15 209 lb (94.802 kg)      Other studies Reviewed: Additional studies/ records that were reviewed today include: None. The findings include no recent labs.    ASSESSMENT AND PLAN:  1. 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 Paroxysmal atrial fibrillation (HCC) No symptomatic clinical recurrences  3. On amiodarone therapy Stable on amiodarone  4. Chronic anticoagulation No bleeding complications  5. Essential hypertension Well controlled  6. Imbalance and cognitive impairment    Current medicines are reviewed at length with the patient today.  The patient has the following concerns regarding medicines: None.  The following changes/actions have been instituted:    Neurology consultation here in [facility] [place] lateral chest x-ray, and liver panel.  TSH and liver panel in 6 months.  Encouraged aerobic activity.  Labs/ tests ordered today include:  No orders of the defined types were placed in this encounter.     Disposition:   FU with HS in 6 months  Signed, Sinclair Grooms, MD  12/22/2015 11:19 AM    [facility] [place], [place], [place]  [address] Phone: [phone]; Fax: [phone]

## 2015-12-26 ENCOUNTER — Telehealth: Payer: Self-pay

## 2015-12-26 DIAGNOSIS — Z79899 Other long term (current) drug therapy: Secondary | ICD-10-CM

## 2015-12-26 DIAGNOSIS — I480 Paroxysmal atrial fibrillation: Secondary | ICD-10-CM

## 2015-12-26 NOTE — Telephone Encounter (Signed)
Informed patient of results and verbal understanding expressed.  Repeat TSH, LFTs ordered to be scheduled in 6 months. Patient agrees with treatment plan.

## 2015-12-26 NOTE — Telephone Encounter (Signed)
-----   Message from Belva Crome, MD sent at 12/24/2015 11:03 AM EDT ----- Faythe Ghee. Repeat in 6 months

## 2016-01-13 ENCOUNTER — Ambulatory Visit (INDEPENDENT_AMBULATORY_CARE_PROVIDER_SITE_OTHER): Payer: Medicare Other | Admitting: Neurology

## 2016-01-13 ENCOUNTER — Encounter: Payer: Self-pay | Admitting: Neurology

## 2016-01-13 VITALS — BP 104/70 | HR 59 | Ht 67.0 in | Wt 218.6 lb

## 2016-01-13 DIAGNOSIS — Z7901 Long term (current) use of anticoagulants: Secondary | ICD-10-CM

## 2016-01-13 DIAGNOSIS — I25810 Atherosclerosis of coronary artery bypass graft(s) without angina pectoris: Secondary | ICD-10-CM

## 2016-01-13 DIAGNOSIS — E1159 Type 2 diabetes mellitus with other circulatory complications: Secondary | ICD-10-CM

## 2016-01-13 DIAGNOSIS — I63412 Cerebral infarction due to embolism of left middle cerebral artery: Secondary | ICD-10-CM | POA: Diagnosis not present

## 2016-01-13 DIAGNOSIS — I480 Paroxysmal atrial fibrillation: Secondary | ICD-10-CM | POA: Diagnosis not present

## 2016-01-13 NOTE — Patient Instructions (Signed)
-   continue Xarelto and crestor for stroke prevention - follow up with cardiology Dr. Tamala Julian for afib - neurological exam intact and your symptoms most likely associated with anxious and inner stress and probably mild depression - continue wellbutrin and lexapro - recommend to follow up with psychology  - Follow up with your primary care physician for stroke risk factor modification. Recommend maintain blood pressure goal <130/80, diabetes with hemoglobin A1c goal below 6.5% and lipids with LDL cholesterol goal below 70 mg/dL.  - refer to PT for balance training  - relaxation and cope with stress - follow up in 3 months.

## 2016-01-14 DIAGNOSIS — E1159 Type 2 diabetes mellitus with other circulatory complications: Secondary | ICD-10-CM | POA: Insufficient documentation
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
## 2016-01-18 ENCOUNTER — Telehealth: Payer: Self-pay | Admitting: Physician Assistant

## 2016-01-18 DIAGNOSIS — I4891 Unspecified atrial fibrillation: Secondary | ICD-10-CM

## 2016-01-18 MED ORDER — AMIODARONE HCL 200 MG PO TABS: 200.0000 mg | ORAL_TABLET | Freq: Every day | ORAL | Status: DC

## 2016-01-18 NOTE — Telephone Encounter (Signed)
Paged by answering service, The patient need Rx of amiodarone 200mg  qd. Recently seen by Dr. Tamala Julian and lab work was ok. F/u as scheduled in 6 months. Rx set to [facility].   Walter [name], [employer]

## 2016-01-30 ENCOUNTER — Encounter: Payer: Self-pay | Admitting: Physical Therapy

## 2016-01-30 ENCOUNTER — Ambulatory Visit: Payer: Medicare Other | Attending: Neurology | Admitting: Physical Therapy

## 2016-01-30 DIAGNOSIS — R2681 Unsteadiness on feet: Secondary | ICD-10-CM | POA: Diagnosis not present

## 2016-01-30 DIAGNOSIS — R2689 Other abnormalities of gait and mobility: Secondary | ICD-10-CM | POA: Diagnosis not present

## 2016-01-30 NOTE — Therapy (Signed)
[facility] [address] [place], [place], [address] Phone: [phone]   Fax:  [phone]  Physical Therapy Evaluation  Patient Details  Name: Walter [name] MRN: [id number] Date of Birth: July 16, 1946 Referring Provider: Rosalin Hawking, MD  Encounter Date: 01/30/2016      PT End of Session - 01/30/16 1047    Visit Number 1   Number of Visits 9  eval + 8 visits   Date for PT Re-Evaluation 03/30/16   Authorization Type Medicare Traditional primary; [place] Patent examiner; BCBS tertiary   Authorization Time Period G Codes  required   PT Start Time 0932   PT Stop Time 1022   PT Time Calculation (min) 50 min   Equipment Utilized During Treatment Other (comment)  Balance Master and harness   Activity Tolerance Patient tolerated treatment well   Behavior During Therapy WFL for tasks assessed/performed      Past Medical History  Diagnosis Date  . Chronic kidney disease   . Pleural effusion   . Abdominal pain   . Gum disease   . Colon polyp   . Diabetes mellitus   . Cancer [facility])     prostate  . Stroke [facility]     Past Surgical History  Procedure Laterality Date  . Coronary artery bypass graft    . Colon surgery    . Cataract extraction  2006 and 2007    bilateral  . Colonoscopy      There were no vitals filed for this visit.       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 Pt was referred to PT "because I was conmplaining about balance and clumsiness". Prior to CVA, pt had no problems with balance. Pt has fallen out of bed recently, but reports no falls when walking. Pt initially states, "I only fall when I'm stoned," but later reports that "my balance is off all the time," per pt. Pt also reports he tends to run into things when walking.   Pertinent History Likes to be called "Walter [name]". PMH significant for: CVA, HTN, DM II, CAD  s/p CABG, CHF, hemicolectomy   Patient Stated Goals "To find out what is wrong with my balance and what I can do about it."   Currently in Pain? Yes   Pain Score 2    Pain Location Back   Pain Orientation Lower   Pain Descriptors / Indicators Aching   Pain Type Chronic pain   Pain Onset More than a month ago   Pain Frequency Intermittent   Aggravating Factors  bending over   Pain Relieving Factors rest   Multiple Pain Sites No            [facility] PT Assessment - 01/30/16 0001    Assessment   Medical Diagnosis Cerebrovascular accident (CVA) due to embolism of left middle cerebral artery   Referring Provider Rosalin Hawking, MD   Onset Date/Surgical Date 12/30/14   Precautions   Precautions Fall   Restrictions   Weight Bearing Restrictions No   Balance Screen   Has the patient fallen in the past 6 months No   Has the patient had a decrease in activity level because of a fear of falling?  Yes   Is the patient reluctant to leave their home because of a fear of falling?  No   Home Ecologist residence  Living Arrangements Spouse/significant other   Type of [place] to enter   Entrance Stairs-Number of Steps 5   Entrance Stairs-Rails Cannot reach both   Home Layout Two level   Alternate Level Stairs-Number of Steps 15   Alternate Level Stairs-Rails Left   Home Equipment None   Additional Comments Pt reports no difficulty with stairs.   Prior Function   Level of Independence Independent   Vocation Retired   Editor, commissioning Impaired by gross assessment   Additional Comments Pt reports diminished LT in B feet due to h/o diabetic peripheral neuropathy   Coordination   Gross Motor Movements are Fluid and Coordinated Yes   Heel Shin Test Decreased smoothness of movment on RLE as compared with LLE.   ROM / Strength   AROM / PROM / Strength AROM;Strength   AROM   Overall AROM  Within functional limits for tasks performed   in BLE's   Strength   Overall Strength Within functional limits for tasks performed  in BLE's   Transfers   Transfers Sit to Stand;Stand to Sit   Sit to Stand 7: Independent   Stand to Sit 7: Independent   Comments Able to stand from standard chair without UE use.   Ambulation/Gait   Ambulation/Gait Yes   Ambulation Distance (Feet) 210 Feet   Assistive device None   Ambulation Surface Level;Indoor            Vestibular Assessment - 01/30/16 0001    Balancemaster   Therapist, occupational Comment Composite score = 55% compared to age/height normative value of 68%. Ability to utilize somatosensory and vestibular input for balance are both WNL for age/height. COG alignment: to L of midline.               [place] Adult PT Treatment/Exercise - 01/30/16 0001    Ambulation/Gait   Ambulation/Gait Assistance 5: Supervision;4: Min guard;4: Min assist   Ambulation/Gait Assistance Details Mod I in open areas. When ambulating down narrow hallway, pt requires supervision due to impaired obstacle negotiation on R side, as pt nearly collides into objects in R visual field. Also noted LOB during gait with horizontal head turns and 90-degree body turn, requiring min A to recover balance.    Gait Pattern Step-through pattern;Scissoring  LOB occured due to episode of RLE scissoring                PT Education - 01/30/16 1037    Education provided Yes   Education Details PT eval findings, goals, and POC. SOT findings and functional implications. Emphasized importance of visual scanning of R visual field while walking. Initiated HEP; see Pt Instructions for details.    Person(s) Educated Patient   Methods Explanation;Demonstration;Verbal cues;Handout   Comprehension Returned demonstration;Verbalized understanding          PT Short Term Goals - 01/30/16 1059    PT SHORT TERM GOAL #1   Title STG's = LTG's           PT Long Term Goals -  01/30/16 1059    PT LONG TERM GOAL #1   Title Pt will independently perform HEP to maximize functional gains made in PT.  (Target date: 02/27/16)   PT LONG TERM GOAL #2   Title Complete FGA and improve score by 8 points from baseline ot indicate significantly improved dynamic gait stability.    PT LONG TERM GOAL #3   Title  Pt will improve SOT composite score from 55% to > / = 63% to improved use of multi-sensory input for balance.    PT LONG TERM GOAL #4   Title Pt will improve SOT visual score from 56% to > / = 66% to indicate significantly improved use of visual input for balance.    PT LONG TERM GOAL #5   Title Pt will independently ambulate > 500' over indoor surfaces, through narrow hallways, with effective obstacle negotiation to indicate improved attention to R visual field during functional mobility.               Plan - 01/30/16 1049    Clinical Impression Statement Pt is a 70 y/o M referred to outpatient PT to address functional impairments associated with L MCA CVA sustained 12/30/14 with associated hospitalization from 3/24 - 12/31/14.  PMH significant for: HTN, DM II, CAD s/p CABG, CHF, hemicolectomy. PT evaluation reveals: decreased attention to R visual field during functional mobility; balance impairments, as indicated by SOT composite score of 55%, as compared with age/height normative value of 68&; impaired ability to utilize visual input for balance; and impaired dynamic gait stability, as pt exhibited LOB with functional head turns during gait. Pt will benefit from skilled outpatient PT 2x/week for 4 weeks to address said impairments.    Rehab Potential Good   Clinical Impairments Affecting Rehab Potential time since CVA (> 1 year)   PT Frequency 2x / week   PT Duration 4 weeks   PT Treatment/Interventions ADLs/Self Care Home Management;DME Instruction;Balance training;Neuromuscular re-education;Visual/perceptual remediation/compensation;Vestibular;Therapeutic  exercise;Therapeutic activities;Functional mobility training;Stair training;Gait training;Patient/family education   PT Next Visit Plan Complete FGA.    Consulted and Agree with Plan of Care Patient      Patient will benefit from skilled therapeutic intervention in order to improve the following deficits and impairments:  Abnormal gait, Decreased balance, Impaired vision/preception, Decreased coordination, Impaired sensation, Other (comment) (Pain will be monitored but not directly addressed by PT due to nature of referral.)  Visit Diagnosis: Other abnormalities of gait and mobility - Plan: PT plan of care cert/re-cert  Unsteadiness on feet - Plan: PT plan of care cert/re-cert      G-Codes - 0000000 [id number]    Functional Assessment Tool Used SOT composite score = 55%   (age/height normative value = 68%)   Functional Limitation Mobility: Walking and moving around   Mobility: Walking and Moving Around Current Status [id number]) At least 20 percent but less than 40 percent impaired, limited or restricted   Mobility: Walking and Moving Around Goal Status [id number]) At least 1 percent but less than 20 percent impaired, limited or restricted       Problem List Patient Active Problem List   Diagnosis Date Noted  . Type 2 diabetes mellitus with circulatory disorder ([place]) 01/14/2016  . On amiodarone therapy 03/10/2015  . Stroke ([place]) 12/31/2014  . Dysarthria 12/30/2014  . TIA (transient ischemic attack) 12/30/2014  . Chronic systolic heart failure ([place]) 12/28/2014  . CAD (coronary artery disease), autologous vein bypass graft 02/02/2014  . Type II diabetes mellitus ([place]) 02/02/2014  . Paroxysmal atrial fibrillation ([place]) 02/02/2014  . Essential hypertension 02/02/2014  . Chronic anticoagulation 02/02/2014    Billie Ruddy, PT, DPT [facility] [address] [place], [place], [address] Phone: [phone]   Fax:  [phone] 01/30/2016, 11:08  AM   Name: Walter [name] MRN: [id number] Date of Birth: 08/24/1946

## 2016-01-30 NOTE — Patient Instructions (Addendum)
Feet Together (Compliant Surface) Head Motion - Eyes Open    Stand with your back to a corner with a stable chair in front of you. Standing on a couch cushion with feet together, move head slowly: up and down 10 times; right to left 10 times; diagonally up/right to down/left 10 times; and diagonally up/left to down/right 10 times.   Do _1-2___ sessions per day.  Copyright  VHI. All rights reserved.

## 2016-02-07 ENCOUNTER — Ambulatory Visit: Payer: Medicare Other | Attending: Neurology | Admitting: Physical Therapy

## 2016-02-07 DIAGNOSIS — R2689 Other abnormalities of gait and mobility: Secondary | ICD-10-CM | POA: Diagnosis not present

## 2016-02-07 DIAGNOSIS — R2681 Unsteadiness on feet: Secondary | ICD-10-CM | POA: Insufficient documentation

## 2016-02-07 NOTE — Therapy (Signed)
[facility] [address] [place], [place], [address] Phone: [phone]   Fax:  [phone]  Physical Therapy Treatment  Patient Details  Name: Walter [name] MRN: [id number] Date of Birth: 09/10/1946 Referring Provider: Rosalin Hawking, MD  Encounter Date: 02/07/2016      PT End of Session - 02/07/16 2146    Visit Number 2   Number of Visits 9   Date for PT Re-Evaluation 03/30/16   Authorization Type Medicare Traditional primary; [employer] secondary; BCBS tertiary   Authorization Time Period G Codes  required   PT Start Time [phone]   PT Stop Time [phone]   PT Time Calculation (min) 42 min   Activity Tolerance Patient tolerated treatment well   Behavior During Therapy WFL for tasks assessed/performed      Past Medical History  Diagnosis Date  . Chronic kidney disease   . Pleural effusion   . Abdominal pain   . Gum disease   . Colon polyp   . Diabetes mellitus   . Cancer [facility])     prostate  . Stroke [facility])     Past Surgical History  Procedure Laterality Date  . Coronary artery bypass graft    . Colon surgery    . Cataract extraction  2006 and 2007    bilateral  . Colonoscopy      There were no vitals filed for this visit.      Subjective Assessment - 02/07/16 0846    Subjective Pt denies falls or significant changes.    Pertinent History Likes to be called "Walter [name]". PMH significant for: CVA, HTN, DM II, CAD s/p CABG, CHF, hemicolectomy   Patient Stated Goals "To find out what is wrong with my balance and what I can do about it."   Currently in Pain? No/denies            [facility] PT Assessment - 02/07/16 0001    Functional Gait  Assessment   Gait assessed  Yes   Gait Level Surface Walks 20 ft in less than 7 sec but greater than 5.5 sec, uses assistive device, slower speed, mild gait deviations, or deviates 6-10 in outside of the 12 in walkway width.  6.65 sec   Change in Gait Speed Able to smoothly  change walking speed without loss of balance or gait deviation. Deviate no more than 6 in outside of the 12 in walkway width.   Gait with Horizontal Head Turns Performs head turns smoothly with slight change in gait velocity (eg, minor disruption to smooth gait path), deviates 6-10 in outside 12 in walkway width, or uses an assistive device.   Gait with Vertical Head Turns Performs head turns with no change in gait. Deviates no more than 6 in outside 12 in walkway width.   Gait and Pivot Turn Pivot turns safely within 3 sec and stops quickly with no loss of balance.   Step Over Obstacle Is able to step over 2 stacked shoe boxes taped together (9 in total height) without changing gait speed. No evidence of imbalance.   Gait with Narrow Base of Support Ambulates less than 4 steps heel to toe or cannot perform without assistance.   Gait with Eyes Closed Cannot walk 20 ft without assistance, severe gait deviations or imbalance, deviates greater than 15 in outside 12 in walkway width or will not attempt task.  deviates > 15"   Ambulating Backwards Walks 20 ft, uses assistive device, slower speed, mild gait deviations, deviates  6-10 in outside 12 in walkway width.  12.87 sec   Steps Alternating feet, must use rail.   Total Score 20                     [facility] Adult PT Treatment/Exercise - 02/07/16 0001    Ambulation/Gait   Ambulation/Gait Yes   Ambulation/Gait Assistance 5: Supervision   Ambulation Distance (Feet) 1200 Feet   Assistive device None   Gait Pattern Step-through pattern  no episodes of scissoring today   Ambulation Surface Level;Unlevel;Indoor;Outdoor;Paved   Gait Comments Gait x900' over unlevel, paved surfaces while conversating with PT, pt concurrently locating cars of specific color in R visual field. Required cueing to consistently locate cars when therapist positioned on pt's L side. Gait x300' over level, indoor surfaces with pt searching for cards on wall for visual  attention. During initial attempt, pt missed 3 of 16 cards. With cueing for decreased gait velocity and increased attention to R visual field, pt able to locate all cards without further cueing.             Balance Exercises - 02/07/16 2145    Balance Exercises: Standing   Standing Eyes Closed Narrow base of support (BOS);Head turns;Foam/compliant surface;Other reps (comment)  1 pillow, horiz, vert, and diagonal head turns x10 each   Tandem Gait 4 reps;Forward;Intermittent upper extremity support  4 x10' at [place]           PT Education - 02/07/16 1233    Education provided Yes   Education Details FGA findings and implicated fall risk.  Expanded on HEP; see Pt Instructions. Discussed visual scanning to R, especially when engaged in conversation during mobility.    Person(s) Educated Patient   Methods Explanation;Demonstration;Handout;Verbal cues   Comprehension Verbalized understanding;Returned demonstration          PT Short Term Goals - 01/30/16 1059    PT SHORT TERM GOAL #1   Title STG's = LTG's           PT Long Term Goals - 02/07/16 0856    PT LONG TERM GOAL #1   Title Pt will independently perform HEP to maximize functional gains made in PT.  (Target date: 02/27/16)   Status On-going   PT LONG TERM GOAL #2   Title Complete FGA and improve score by 8 points from baseline ot indicate significantly improved dynamic gait stability.    Baseline 5/2: baseline FGA = 20/30   Status On-going   PT LONG TERM GOAL #3   Title Pt will improve SOT composite score from 55% to > / = 63% to improved use of multi-sensory input for balance.    Status On-going   PT LONG TERM GOAL #4   Title Pt will improve SOT visual score from 56% to > / = 66% to indicate significantly improved use of visual input for balance.    Status On-going   PT LONG TERM GOAL #5   Title Pt will independently ambulate > 500' over indoor surfaces, through narrow hallways, with effective obstacle  negotiation to indicate improved attention to R visual field during functional mobility.   Status On-going               Plan - 02/07/16 0906    Clinical Impression Statement Session continued to assess/adrdess dynamic gait stability, visual attention to R side during gait. Pt scored 20/30 on FGA, suggesting increased fall risk. Expanded on HEP. Noted pt inconsistently able to attend to R  visual field while engaged in conversation during gait.   Rehab Potential Good   Clinical Impairments Affecting Rehab Potential time since CVA (> 1 year)   PT Frequency 2x / week   PT Duration 4 weeks   PT Treatment/Interventions ADLs/Self Care Home Management;DME Instruction;Balance training;Neuromuscular re-education;Visual/perceptual remediation/compensation;Vestibular;Therapeutic exercise;Therapeutic activities;Functional mobility training;Stair training;Gait training;Patient/family education   PT Next Visit Plan Continue to address attention to R visual field during mobility, standing balance with emphasis on increasing visual reliance, and dynamic gait activities.   Consulted and Agree with Plan of Care Patient      Patient will benefit from skilled therapeutic intervention in order to improve the following deficits and impairments:  Abnormal gait, Decreased balance, Impaired vision/preception, Decreased coordination, Impaired sensation  Visit Diagnosis: Other abnormalities of gait and mobility  Unsteadiness on feet     Problem List Patient Active Problem List   Diagnosis Date Noted  . Type 2 diabetes mellitus with circulatory disorder ([place]) 01/14/2016  . On amiodarone therapy 03/10/2015  . Stroke ([place]) 12/31/2014  . Dysarthria 12/30/2014  . TIA (transient ischemic attack) 12/30/2014  . Chronic systolic heart failure ([place]) 12/28/2014  . CAD (coronary artery disease), autologous vein bypass graft 02/02/2014  . Type II diabetes mellitus ([place]) 02/02/2014  . Paroxysmal atrial  fibrillation ([place]) 02/02/2014  . Essential hypertension 02/02/2014  . Chronic anticoagulation 02/02/2014    Billie Ruddy, PT, DPT [facility] [address] [place], [place], [address] Phone: [phone]   Fax:  [phone] 02/07/2016, 9:50 PM  Name: Walter [name] MRN: [id number] Date of Birth: 1946-08-06

## 2016-02-07 NOTE — Patient Instructions (Addendum)
[name], remember to visually scan (turn head to right side) when you're walking - especially when you're engaged in conversation.  Feet Together (Compliant Surface) Head Motion - Eyes Open    Stand with your back to a corner with a stable chair in front of you. Standing on a couch cushion with feet together, move head slowly: up and down 10 times; right to left 10 times; diagonally up/right to down/left 10 times; and diagonally up/left to down/right 10 times.  Do _1-2___ sessions per day.  Feet Heel-Toe "Tandem"    Stand next to your countertop with one hand hovering over counter (in case you lose your balance), walk a straight line bringing one foot directly in front of the other. Walk the length of your countertop 4 laps (down and back), __2__  times per day.  Copyright  VHI. All rights reserved.

## 2016-02-09 ENCOUNTER — Ambulatory Visit: Payer: Medicare Other | Admitting: Physical Therapy

## 2016-02-09 DIAGNOSIS — R2681 Unsteadiness on feet: Secondary | ICD-10-CM | POA: Diagnosis not present

## 2016-02-09 DIAGNOSIS — R2689 Other abnormalities of gait and mobility: Secondary | ICD-10-CM | POA: Diagnosis not present

## 2016-02-09 NOTE — Therapy (Signed)
[facility] [address] [place], [place], [address] Phone: [phone]   Fax:  [phone]  Physical Therapy Treatment  Patient Details  Name: Walter [name] MRN: [id number] Date of Birth: 1946/05/05 Referring Provider: Rosalin Hawking, MD  Encounter Date: 02/09/2016      PT End of Session - 02/09/16 1245    Visit Number 3   Number of Visits 9   Date for PT Re-Evaluation 03/30/16   Authorization Type Medicare Traditional primary; [place] Patent examiner; BCBS tertiary   Authorization Time Period G Codes  required   PT Start Time [phone]   PT Stop Time 1015   PT Time Calculation (min) 38 min   Activity Tolerance Patient tolerated treatment well   Behavior During Therapy Impulsive  Requires cueing to slow down, attend to task      Past Medical History  Diagnosis Date  . Chronic kidney disease   . Pleural effusion   . Abdominal pain   . Gum disease   . Colon polyp   . Diabetes mellitus   . Cancer [facility])     prostate  . Stroke [facility])     Past Surgical History  Procedure Laterality Date  . Coronary artery bypass graft    . Colon surgery    . Cataract extraction  2006 and 2007    bilateral  . Colonoscopy      There were no vitals filed for this visit.      Subjective Assessment - 02/09/16 0941    Subjective Pt denies falls or significant changes. Pt is doing his exercises "sometimes". Pt states, "I have an easier time doing the exercise that I can just do in the kitchen."   Pertinent History Likes to be called "Walter [name]". PMH significant for: CVA, HTN, DM II, CAD s/p CABG, CHF, hemicolectomy   Patient Stated Goals "To find out what is wrong with my balance and what I can do about it."   Currently in Pain? No/denies                         [facility] Adult PT Treatment/Exercise - 02/09/16 0001    Ambulation/Gait   Ambulation/Gait Yes   Ambulation/Gait Assistance 5: Supervision   Ambulation Distance  (Feet) 800 Feet   Assistive device None   Gait Pattern Step-through pattern  no episodes of scissoring today   Ambulation Surface Level;Indoor   Pre-Gait Activities Performed gait 10' x8 reps each with horizontal, vertical, and diagonal head turns while concurrently performing cognitive task (counting backwards from 100 by 3's). Noted pt with significant difficulty continuing to turn head while engaged in cognitive task.     Gait Comments Gait training occurred while pt searching for playing cards on wall. Initially pt missed 7 of 20 cards. PT again cued pt to decrease gait speed and visually scan in B directions with emphasis on superior scanning, as majority of missed cards were above eye-level.             Balance Exercises - 02/09/16 1240    Balance Exercises: Standing   Standing Eyes Opened Narrow base of support (BOS);Foam/compliant surface;Other reps (comment);Head turns  couch cushion; horiz, vert, and diagonal head turns x10 each   Standing Eyes Closed Narrow base of support (BOS);Head turns;Foam/compliant surface;Other reps (comment)  couch cushion; horiz, vert, and diagonal head turns x10 each   Tandem Gait Forward;5 reps;Other (comment);Upper extremity support  5 reps x10' with cueing for finger  tip support           PT Education - 02/09/16 1235    Education provided Yes   Education Details Modified HEP to promote pt compliance.   Person(s) Educated Patient   Methods Explanation;Demonstration;Handout   Comprehension Verbalized understanding;Returned demonstration          PT Short Term Goals - 01/30/16 1059    PT SHORT TERM GOAL #1   Title STG's = LTG's           PT Long Term Goals - 02/07/16 0856    PT LONG TERM GOAL #1   Title Pt will independently perform HEP to maximize functional gains made in PT.  (Target date: 02/27/16)   Status On-going   PT LONG TERM GOAL #2   Title Complete FGA and improve score by 8 points from baseline ot indicate  significantly improved dynamic gait stability.    Baseline 5/2: baseline FGA = 20/30   Status On-going   PT LONG TERM GOAL #3   Title Pt will improve SOT composite score from 55% to > / = 63% to improved use of multi-sensory input for balance.    Status On-going   PT LONG TERM GOAL #4   Title Pt will improve SOT visual score from 56% to > / = 66% to indicate significantly improved use of visual input for balance.    Status On-going   PT LONG TERM GOAL #5   Title Pt will independently ambulate > 500' over indoor surfaces, through narrow hallways, with effective obstacle negotiation to indicate improved attention to R visual field during functional mobility.   Status On-going               Plan - 02/09/16 1246    Clinical Impression Statement Session continued to focus on visual attention to R side during functional mobility, gait stability with concurrent cognitive task. Pt continues to demonstrate decreased attention to R visual field and to R/L superior fields during mobility.   Rehab Potential Good   Clinical Impairments Affecting Rehab Potential time since CVA (> 1 year)   PT Frequency 2x / week   PT Duration 4 weeks   PT Treatment/Interventions ADLs/Self Care Home Management;DME Instruction;Balance training;Neuromuscular re-education;Visual/perceptual remediation/compensation;Vestibular;Therapeutic exercise;Therapeutic activities;Functional mobility training;Stair training;Gait training;Patient/family education   PT Next Visit Plan Continue to address dual tasking during functional mobility, attention to R visual field during mobility, dynamic gait activities, hip strategy.   Consulted and Agree with Plan of Care Patient      Patient will benefit from skilled therapeutic intervention in order to improve the following deficits and impairments:  Abnormal gait, Decreased balance, Impaired vision/preception, Decreased coordination, Impaired sensation  Visit Diagnosis: Other  abnormalities of gait and mobility  Unsteadiness on feet     Problem List Patient Active Problem List   Diagnosis Date Noted  . Type 2 diabetes mellitus with circulatory disorder ([place]) 01/14/2016  . On amiodarone therapy 03/10/2015  . Stroke ([place]) 12/31/2014  . Dysarthria 12/30/2014  . TIA (transient ischemic attack) 12/30/2014  . Chronic systolic heart failure ([place]) 12/28/2014  . CAD (coronary artery disease), autologous vein bypass graft 02/02/2014  . Type II diabetes mellitus ([place]) 02/02/2014  . Paroxysmal atrial fibrillation ([place]) 02/02/2014  . Essential hypertension 02/02/2014  . Chronic anticoagulation 02/02/2014    Billie Ruddy, PT, DPT [facility] [address] [place], [place], [address] Phone: [phone]   Fax:  [phone] 02/09/2016, 12:50 PM  Name: Laurens Sahai  MRN: [id number] Date of Birth: 08/17/1946

## 2016-02-09 NOTE — Patient Instructions (Signed)
Feet Heel-Toe "Tandem"    Stand next to your countertop with one hand hovering over counter (in case you lose your balance), walk a straight line bringing one foot directly in front of the other. Walk the length of your countertop 4 laps (down and back), __2__ times per day.  Walking Head Turn    Walk close to your countertop for this exercise. While concurrently counting backwards by 3's from 100, walk turning head side to side for 2 laps (down and back); while nodding head up/down for 2 laps; while turning head diagonaly up/right to down/left for 2 laps; and while turning head diagonally up/left to down/right for 2 laps.. Touch countertop if necessary to keep balance.  Do this exercise _2_ times per day.

## 2016-02-13 DIAGNOSIS — L570 Actinic keratosis: Secondary | ICD-10-CM | POA: Diagnosis not present

## 2016-02-13 DIAGNOSIS — L814 Other melanin hyperpigmentation: Secondary | ICD-10-CM | POA: Diagnosis not present

## 2016-02-13 DIAGNOSIS — D235 Other benign neoplasm of skin of trunk: Secondary | ICD-10-CM | POA: Diagnosis not present

## 2016-02-13 DIAGNOSIS — Z85828 Personal history of other malignant neoplasm of skin: Secondary | ICD-10-CM | POA: Diagnosis not present

## 2016-02-13 DIAGNOSIS — L820 Inflamed seborrheic keratosis: Secondary | ICD-10-CM | POA: Diagnosis not present

## 2016-02-13 DIAGNOSIS — D1801 Hemangioma of skin and subcutaneous tissue: Secondary | ICD-10-CM | POA: Diagnosis not present

## 2016-02-14 ENCOUNTER — Ambulatory Visit: Payer: Medicare Other | Admitting: Physical Therapy

## 2016-02-14 ENCOUNTER — Encounter: Payer: Self-pay | Admitting: Physical Therapy

## 2016-02-14 DIAGNOSIS — R2689 Other abnormalities of gait and mobility: Secondary | ICD-10-CM

## 2016-02-14 DIAGNOSIS — R2681 Unsteadiness on feet: Secondary | ICD-10-CM

## 2016-02-14 NOTE — Therapy (Signed)
[facility] [address] [place], [place], [address] Phone: [phone]   Fax:  [phone]  Physical Therapy Treatment  Patient Details  Name: Walter [name] MRN: [id number] Date of Birth: 10-31-1945 Referring Provider: Rosalin Hawking, MD  Encounter Date: 02/14/2016      PT End of Session - 02/14/16 1139    Visit Number 4   Number of Visits 9   Date for PT Re-Evaluation 03/30/16   Authorization Type Medicare Traditional primary; [place] Patent examiner; BCBS tertiary   Authorization Time Period G Codes  required   PT Start Time [phone]   PT Stop Time 1015   PT Time Calculation (min) 44 min   Equipment Utilized During Treatment Gait belt   Activity Tolerance Patient tolerated treatment well   Behavior During Therapy Impulsive;WFL for tasks assessed/performed  Requires cueing to slow down, attend to task      Past Medical History  Diagnosis Date  . Chronic kidney disease   . Pleural effusion   . Abdominal pain   . Gum disease   . Colon polyp   . Diabetes mellitus   . Cancer [facility])     prostate  . Stroke [facility])     Past Surgical History  Procedure Laterality Date  . Coronary artery bypass graft    . Colon surgery    . Cataract extraction  2006 and 2007    bilateral  . Colonoscopy      There were no vitals filed for this visit.      Subjective Assessment - 02/14/16 0935    Subjective No new complaints. No falls or pain to report. Has done the exercises program.   Pertinent History Likes to be called "[name]". PMH significant for: CVA, HTN, DM II, CAD s/p CABG, CHF, hemicolectomy   Patient Stated Goals "To find out what is wrong with my balance and what I can do about it."   Currently in Pain? No/denies   Pain Score 0-No pain            [facility] PT Assessment - 02/14/16 0938    Functional Gait  Assessment   Gait assessed  Yes   Gait Level Surface Walks 20 ft in less than 5.5 sec, no assistive devices,  good speed, no evidence for imbalance, normal gait pattern, deviates no more than 6 in outside of the 12 in walkway width.   Change in Gait Speed Able to smoothly change walking speed without loss of balance or gait deviation. Deviate no more than 6 in outside of the 12 in walkway width.   Gait with Horizontal Head Turns Performs head turns smoothly with no change in gait. Deviates no more than 6 in outside 12 in walkway width   Gait with Vertical Head Turns Performs head turns with no change in gait. Deviates no more than 6 in outside 12 in walkway width.   Gait and Pivot Turn Pivot turns safely within 3 sec and stops quickly with no loss of balance.   Step Over Obstacle Is able to step over 2 stacked shoe boxes taped together (9 in total height) without changing gait speed. No evidence of imbalance.   Gait with Narrow Base of Support Ambulates less than 4 steps heel to toe or cannot perform without assistance.   Gait with Eyes Closed Walks 20 ft, slow speed, abnormal gait pattern, evidence for imbalance, deviates 10-15 in outside 12 in walkway width. Requires more than 9 sec to ambulate 20 ft.  Ambulating Backwards Walks 20 ft, no assistive devices, good speed, no evidence for imbalance, normal gait   Steps Alternating feet, no rail.   Total Score 25      Neuro re-ed: sensory organization test performed with following results: Conditions: 1:  All above norm 2:  All above norm 3:  Below norm, 2 above norm 4: all above norm 5: below, fall, below 6: fall, 2 above norm Composite score: 63 (still below norm)   Sensory Analysis Som: above norm Vis: above norm Vest: below norm Pref: above norm Strategy analysis:  Decreased hip strategy     COG alignment: posterior             Balance Exercises - 02/14/16 1136    Balance Exercises: Standing   Standing Eyes Closed Narrow base of support (BOS);Head turns;Foam/compliant surface;Other reps (comment);20 secs;Limitations   Balance Exercises:  Standing   Standing Eyes Closed Limitations blue mat over ramp: performed facing both ways on ramp with feet together: EC no head movements 20 sec's x 3, EC head movements up<>down and left<>right x 10 each way, min guard to min assist for balance.                                                        PT Education - 02/14/16 1138    Education provided Yes   Education Details resutls of FGA and SOT performed today, progress toward goals and continued balance deficts noted with test performed today   Person(s) Educated Patient   Methods Explanation;Demonstration;Handout   Comprehension Verbalized understanding          PT Short Term Goals - 01/30/16 1059    PT SHORT TERM GOAL #1   Title STG's = LTG's           PT Long Term Goals - 02/14/16 [id number]    PT LONG TERM GOAL #1   Title Pt will independently perform HEP to maximize functional gains made in PT.  (Target date: 02/27/16)   Status On-going   PT LONG TERM GOAL #2   Title Complete FGA and improve score by 8 points from baseline ot indicate significantly improved dynamic gait stability.    Baseline 5/2: baseline FGA = 20/30   Status On-going   PT LONG TERM GOAL #3   Title Pt will improve SOT composite score from 55% to > / = 63% to improved use of multi-sensory input for balance.    Status On-going   PT LONG TERM GOAL #4   Title Pt will improve SOT visual score from 56% to > / = 66% to indicate significantly improved use of visual input for balance.    Status On-going   PT LONG TERM GOAL #5   Title Pt will independently ambulate > 500' over indoor surfaces, through narrow hallways, with effective obstacle negotiation to indicate improved attention to R visual field during functional mobility.   Status On-going            Plan - 02/14/16 1140    Clinical Impression Statement Pt arrived today stating he felt he had made great progress and wondering if he was ready for discharge. Functional Gait assessment and [place]  Organization Test's performed and compared to results of evaluation date. Pt has shown progress with both, however continues to demonstrate balance deficits.  Pt agreed after seeing results from todays testing. Remainder of today's session focused on balance with emphasis on vestibular system imput. Pt does continue to need increased assistance with balance on compliant surfaces, with narrowed base of support and when decreased visual imput.                                                                Rehab Potential Good   Clinical Impairments Affecting Rehab Potential time since CVA (> 1 year)   PT Frequency 2x / week   PT Duration 4 weeks   PT Treatment/Interventions ADLs/Self Care Home Management;DME Instruction;Balance training;Neuromuscular re-education;Visual/perceptual remediation/compensation;Vestibular;Therapeutic exercise;Therapeutic activities;Functional mobility training;Stair training;Gait training;Patient/family education   PT Next Visit Plan Continue to address dual tasking during functional mobility, attention to R visual field during mobility, dynamic gait activities, hip strategy.   Consulted and Agree with Plan of Care Patient      Patient will benefit from skilled therapeutic intervention in order to improve the following deficits and impairments:  Abnormal gait, Decreased balance, Impaired vision/preception, Decreased coordination, Impaired sensation  Visit Diagnosis: Other abnormalities of gait and mobility  Unsteadiness on feet     Problem List Patient Active Problem List   Diagnosis Date Noted  . Type 2 diabetes mellitus with circulatory disorder ([place]) 01/14/2016  . On amiodarone therapy 03/10/2015  . Stroke ([place]) 12/31/2014  . Dysarthria 12/30/2014  . TIA (transient ischemic attack) 12/30/2014  . Chronic systolic heart failure ([place]) 12/28/2014  . CAD (coronary artery disease), autologous vein bypass graft 02/02/2014  . Type II diabetes mellitus ([place])  02/02/2014  . Paroxysmal atrial fibrillation ([place]) 02/02/2014  . Essential hypertension 02/02/2014  . Chronic anticoagulation 02/02/2014    Willow Ora, PTA, [facility] Outpatient Neuro [facility] [address], [place], [place] [address] [phone] 02/14/2016, 11:45 AM   Name: Walter [name] MRN: [id number] Date of Birth: April 28, 1946

## 2016-02-16 ENCOUNTER — Ambulatory Visit: Payer: Medicare Other | Admitting: Physical Therapy

## 2016-02-16 ENCOUNTER — Encounter: Payer: Self-pay | Admitting: Physical Therapy

## 2016-02-16 DIAGNOSIS — R2689 Other abnormalities of gait and mobility: Secondary | ICD-10-CM

## 2016-02-16 DIAGNOSIS — R2681 Unsteadiness on feet: Secondary | ICD-10-CM

## 2016-02-17 NOTE — Therapy (Signed)
[facility] [address] [place], [place], [address] Phone: [phone]   Fax:  [phone]  Physical Therapy Treatment  Patient Details  Name: Walter [name] MRN: [id number] Date of Birth: 1946/03/19 Referring Provider: Rosalin Hawking, MD  Encounter Date: 02/16/2016      PT End of Session - 02/16/16 0934    Visit Number 5   Number of Visits 9   Date for PT Re-Evaluation 03/30/16   Authorization Type Medicare Traditional primary; [place] Patent examiner; BCBS tertiary   Authorization Time Period G Codes  required   PT Start Time [phone]   PT Stop Time 1015   PT Time Calculation (min) 42 min   Equipment Utilized During Treatment Gait belt   Activity Tolerance Patient tolerated treatment well   Behavior During Therapy Impulsive;WFL for tasks assessed/performed  Requires cueing to slow down, attend to task      Past Medical History  Diagnosis Date  . Chronic kidney disease   . Pleural effusion   . Abdominal pain   . Gum disease   . Colon polyp   . Diabetes mellitus   . Cancer [facility])     prostate  . Stroke [facility])     Past Surgical History  Procedure Laterality Date  . Coronary artery bypass graft    . Colon surgery    . Cataract extraction  2006 and 2007    bilateral  . Colonoscopy      There were no vitals filed for this visit.      Subjective Assessment - 02/16/16 0934    Subjective No new complaints. No falls or pain to report. Doing HEP some at home,   Pertinent History Likes to be called "Walter [name]". PMH significant for: CVA, HTN, DM II, CAD s/p CABG, CHF, hemicolectomy   Patient Stated Goals "To find out what is wrong with my balance and what I can do about it."   Currently in Pain? No/denies   Pain Score 0-No pain             [facility] Adult PT Treatment/Exercise - 02/16/16 0935    Ambulation/Gait   Ambulation/Gait Yes   Ambulation/Gait Assistance 5: Supervision;4: Min guard   Ambulation/Gait  Assistance Details along a 50 foot hallway: forward gait with head movements up<>down and left <>right  x 4 laps each with min guard assist   Assistive device None   Gait Pattern Step-through pattern   Ambulation Surface Level;Indoor          Balance Exercises - 02/16/16 0954    Balance Exercises: Standing   Rockerboard Anterior/posterior;Lateral;Head turns;EC;Other time (comment);10 reps   Balance Beam blue foam balance beam: standing with feet across foam beam: alternating forward heel taps and backward toe taps x 10 bil legs; static standing with EC no head movements, then EC with head movements up<>down and left<>right.                        Gait with Head Turns Forward;Retro;Other reps (comment);Limitations;Foam/compliant surface  grass outside   Tandem Gait Forward;Retro;Foam/compliant surface;Other reps (comment);Limitations  gravel and grass outside   Balance Exercises: Standing   Rebounder Limitations performed both ways on the board without UE support: EC no head movements, EC with head movements up<>down, left<>right, and diagonals both ways with min assist   Tandem Gait Limitations outdoors: tandem gait forward and backwards over both gravel and grass. gait with head turns left<>right and up<>down, forward/backward over grass  surfaces. min guard to min assist for balance with multiple episodes of balance loss.                                                          PT Short Term Goals - 01/30/16 1059    PT SHORT TERM GOAL #1   Title STG's = LTG's           PT Long Term Goals - 02/14/16 [id number]    PT LONG TERM GOAL #1   Title Pt will independently perform HEP to maximize functional gains made in PT.  (Target date: 02/27/16)   Status On-going   PT LONG TERM GOAL #2   Title Complete FGA and improve score by 8 points from baseline ot indicate significantly improved dynamic gait stability.    Baseline 5/2: baseline FGA = 20/30   Status On-going   PT LONG TERM GOAL #3    Title Pt will improve SOT composite score from 55% to > / = 63% to improved use of multi-sensory input for balance.    Status On-going   PT LONG TERM GOAL #4   Title Pt will improve SOT visual score from 56% to > / = 66% to indicate significantly improved use of visual input for balance.    Status On-going   PT LONG TERM GOAL #5   Title Pt will independently ambulate > 500' over indoor surfaces, through narrow hallways, with effective obstacle negotiation to indicate improved attention to R visual field during functional mobility.   Status On-going           Plan - 02/16/16 0935    Clinical Impression Statement Today's session continued to address high level balance activites with emphasis on vestibular system imput. No issues were reported. Pt is making steady progress toward goals.    Rehab Potential Good   Clinical Impairments Affecting Rehab Potential time since CVA (> 1 year)   PT Frequency 2x / week   PT Duration 4 weeks   PT Treatment/Interventions ADLs/Self Care Home Management;DME Instruction;Balance training;Neuromuscular re-education;Visual/perceptual remediation/compensation;Vestibular;Therapeutic exercise;Therapeutic activities;Functional mobility training;Stair training;Gait training;Patient/family education   PT Next Visit Plan Continue to address dual tasking during functional mobility, attention to R visual field during mobility, dynamic gait activities, hip strategy.   Consulted and Agree with Plan of Care Patient      Patient will benefit from skilled therapeutic intervention in order to improve the following deficits and impairments:  Abnormal gait, Decreased balance, Impaired vision/preception, Decreased coordination, Impaired sensation  Visit Diagnosis: Other abnormalities of gait and mobility  Unsteadiness on feet     Problem List Patient Active Problem List   Diagnosis Date Noted  . Type 2 diabetes mellitus with circulatory disorder ([place]) 01/14/2016   . On amiodarone therapy 03/10/2015  . Stroke ([place]) 12/31/2014  . Dysarthria 12/30/2014  . TIA (transient ischemic attack) 12/30/2014  . Chronic systolic heart failure ([place]) 12/28/2014  . CAD (coronary artery disease), autologous vein bypass graft 02/02/2014  . Type II diabetes mellitus ([place]) 02/02/2014  . Paroxysmal atrial fibrillation ([place]) 02/02/2014  . Essential hypertension 02/02/2014  . Chronic anticoagulation 02/02/2014    Willow Ora, PTA, [facility] Outpatient Neuro [facility] [address] [place], [place] [address] [phone] 02/17/2016, 4:11 PM   Name: Walter [name] MRN: [id number] Date of Birth:  08/20/1946     

## 2016-02-21 ENCOUNTER — Ambulatory Visit: Payer: Medicare Other | Admitting: Physical Therapy

## 2016-02-21 DIAGNOSIS — R2689 Other abnormalities of gait and mobility: Secondary | ICD-10-CM

## 2016-02-21 DIAGNOSIS — R2681 Unsteadiness on feet: Secondary | ICD-10-CM | POA: Diagnosis not present

## 2016-02-21 NOTE — Therapy (Signed)
[facility] [address] [place], [place], [address] Phone: [phone]   Fax:  [phone]  Physical Therapy Treatment  Patient Details  Name: Walter [name] MRN: [id number] Date of Birth: [birth year] Referring Provider: Rosalin Hawking, MD  Encounter Date: 02/21/2016      PT End of Session - 02/21/16 0938    Visit Number 6   Number of Visits 9   Date for PT Re-Evaluation 03/30/16   Authorization Type Medicare Traditional primary; [place] Patent examiner; BCBS tertiary   Authorization Time Period G Codes  required   PT Start Time 0935   PT Stop Time 1015   PT Time Calculation (min) 40 min   Equipment Utilized During Treatment Gait belt   Activity Tolerance Patient tolerated treatment well   Behavior During Therapy Impulsive;WFL for tasks assessed/performed  Requires cueing to slow down, attend to task      Past Medical History  Diagnosis Date  . Chronic kidney disease   . Pleural effusion   . Abdominal pain   . Gum disease   . Colon polyp   . Diabetes mellitus   . Cancer [facility])     prostate  . Stroke [facility])     Past Surgical History  Procedure Laterality Date  . Coronary artery bypass graft    . Colon surgery    . Cataract extraction  2006 and 2007    bilateral  . Colonoscopy      There were no vitals filed for this visit.      Subjective Assessment - 02/21/16 0937    Subjective No new complaints. No falls or pain to report. HEP is going well "when I remember to do them".   Pertinent History Likes to be called "[name]". PMH significant for: CVA, HTN, DM II, CAD s/p CABG, CHF, hemicolectomy   Patient Stated Goals "To find out what is wrong with my balance and what I can do about it."   Currently in Pain? No/denies   Pain Score 0-No pain             [facility] Adult PT Treatment/Exercise - 02/21/16 0939    Ambulation/Gait   Ambulation/Gait Yes   Ambulation/Gait Assistance 5: Supervision;4: Min guard    Ambulation/Gait Assistance Details at a fast pace, head turns,nods and enviorment scanning with min guard to min assist for balance   Ambulation Distance (Feet) 800 Feet   Assistive device None   Gait Pattern Step-through pattern   Ambulation Surface Level;Unlevel;Indoor;Outdoor;Paved   High Level Balance   High Level Balance Activities Tandem walking;Marching forwards;Marching backwards   High Level Balance Comments marching up/down grass hill, tandem fwd/bwd along level grass surfaces with cues on posture, ex form and min assist for balance                                    Balance Exercises - 02/21/16 0950    Balance Exercises: Standing   Standing Eyes Closed Narrow base of support (BOS);Head turns;Foam/compliant surface;Other reps (comment);Limitations   Balance Beam blue foam balance beam: standing with feet across foam beam: alternating forward heel taps and backward toe taps x 10 bil legs; static standing with EC no head movements, then EC with head movements up<>down, left<>right and diagonals both ways with up to min assist for balance.  Balance Exercises: Standing   Standing Eyes Closed Limitations blue mat over ramp: performed facing both ways on ramp with feet together: EC no head movements 20 sec's x 3, EC head movements up<>down, left<>right and diagonals both ways x 10 each way, min guard to min assist for balance.                                                          PT Short Term Goals - 01/30/16 1059    PT SHORT TERM GOAL #1   Title STG's = LTG's           PT Long Term Goals - 02/14/16 [id number]    PT LONG TERM GOAL #1   Title Pt will independently perform HEP to maximize functional gains made in PT.  (Target date: 02/27/16)   Status On-going   PT LONG TERM GOAL #2   Title Complete FGA and improve score by 8 points from baseline ot indicate significantly improved dynamic gait stability.    Baseline 5/2: baseline FGA = 20/30    Status On-going   PT LONG TERM GOAL #3   Title Pt will improve SOT composite score from 55% to > / = 63% to improved use of multi-sensory input for balance.    Status On-going   PT LONG TERM GOAL #4   Title Pt will improve SOT visual score from 56% to > / = 66% to indicate significantly improved use of visual input for balance.    Status On-going   PT LONG TERM GOAL #5   Title Pt will independently ambulate > 500' over indoor surfaces, through narrow hallways, with effective obstacle negotiation to indicate improved attention to R visual field during functional mobility.   Status On-going            Plan - 02/21/16 [id number]    Clinical Impression Statement Today's skilled session continued to focus on dynamic gait and high level balance activities with no issues reported. Pt is making steady progress toward goals.   Rehab Potential Good   Clinical Impairments Affecting Rehab Potential time since CVA (> 1 year)   PT Frequency 2x / week   PT Duration 4 weeks   PT Treatment/Interventions ADLs/Self Care Home Management;DME Instruction;Balance training;Neuromuscular re-education;Visual/perceptual remediation/compensation;Vestibular;Therapeutic exercise;Therapeutic activities;Functional mobility training;Stair training;Gait training;Patient/family education   PT Next Visit Plan Continue to address dual tasking during functional mobility, attention to R visual field during mobility, dynamic gait activities, hip strategy.   Consulted and Agree with Plan of Care Patient      Patient will benefit from skilled therapeutic intervention in order to improve the following deficits and impairments:  Abnormal gait, Decreased balance, Impaired vision/preception, Decreased coordination, Impaired sensation  Visit Diagnosis: Other abnormalities of gait and mobility  Unsteadiness on feet     Problem List Patient Active Problem List   Diagnosis Date Noted  . Type 2 diabetes mellitus with circulatory  disorder ([place]) 01/14/2016  . On amiodarone therapy 03/10/2015  . Stroke ([place]) 12/31/2014  . Dysarthria 12/30/2014  . TIA (transient ischemic attack) 12/30/2014  . Chronic systolic heart failure ([place]) 12/28/2014  . CAD (coronary artery disease), autologous vein bypass graft 02/02/2014  . Type II diabetes mellitus ([place]) 02/02/2014  . Paroxysmal atrial fibrillation ([place]) 02/02/2014  . Essential hypertension 02/02/2014  . Chronic  anticoagulation 02/02/2014    Willow Ora, PTA, [facility] [address] [place], [place] [address] [phone] 02/21/2016, 4:25 PM   Name: Walter [name] MRN: [id number] Date of Birth: 05/25/1946

## 2016-02-23 ENCOUNTER — Encounter: Payer: Self-pay | Admitting: Physical Therapy

## 2016-02-23 ENCOUNTER — Ambulatory Visit: Payer: Medicare Other | Admitting: Physical Therapy

## 2016-02-23 DIAGNOSIS — R2689 Other abnormalities of gait and mobility: Secondary | ICD-10-CM

## 2016-02-23 DIAGNOSIS — R2681 Unsteadiness on feet: Secondary | ICD-10-CM

## 2016-02-24 NOTE — Therapy (Signed)
[facility] [address] [place], [place], [address] Phone: [phone]   Fax:  [phone]  Physical Therapy Treatment  Patient Details  Name: Walter [name] MRN: [id number] Date of Birth: November 02, 1945 Referring Provider: Rosalin Hawking, MD  Encounter Date: 02/23/2016      PT End of Session - 02/23/16 1151    Visit Number 7   Number of Visits 9   Date for PT Re-Evaluation 03/30/16   Authorization Type Medicare Traditional primary; [place] Patent examiner; BCBS tertiary   Authorization Time Period G Codes  required   PT Start Time 1147   PT Stop Time 1230   PT Time Calculation (min) 43 min   Equipment Utilized During Treatment Gait belt   Activity Tolerance Patient tolerated treatment well   Behavior During Therapy Impulsive;WFL for tasks assessed/performed  Requires cueing to slow down, attend to task      Past Medical History  Diagnosis Date  . Chronic kidney disease   . Pleural effusion   . Abdominal pain   . Gum disease   . Colon polyp   . Diabetes mellitus   . Cancer [facility])     prostate  . Stroke [facility])     Past Surgical History  Procedure Laterality Date  . Coronary artery bypass graft    . Colon surgery    . Cataract extraction  2006 and 2007    bilateral  . Colonoscopy      There were no vitals filed for this visit.      Subjective Assessment - 02/23/16 1150    Subjective No new complaints. No falls or pain to report. HEP is going well "when I remember to do them". Reports he is working his dynamic gait activites into walking dog on grass (ie head turns, nods, etc).    Pertinent History Likes to be called "Walter [name]". PMH significant for: CVA, HTN, DM II, CAD s/p CABG, CHF, hemicolectomy   Patient Stated Goals "To find out what is wrong with my balance and what I can do about it."   Currently in Pain? No/denies   Pain Score 0-No pain            [facility] Adult PT Treatment/Exercise - 02/23/16 1153     Ambulation/Gait   Ambulation/Gait Yes   Ambulation/Gait Assistance 5: Supervision   Ambulation/Gait Assistance Details walking at a fast pace on all surfaces, scanning enviroment, head turns/nods, all with supervision.   Assistive device None   Gait Pattern Step-through pattern   Ambulation Surface Indoor;Outdoor;Paved;Gravel;Grass;Unlevel;Level             Balance Exercises - 02/23/16 1205    Balance Exercises: Standing   Rockerboard Anterior/posterior;Lateral;Head turns;EO;EC;Other time (comment);10 reps   Balance Beam blue foam balance beam: standing with feet across foam beam: alternating forward heel taps and backward toe taps x 10 bil legs; static standing with EC no head movements, then EC with head movements up<>down, left<>right and diagonals both ways with up to min assist for balance.                        Balance Exercises: Standing   Rebounder Limitations large rockerboard: performed both ways without UE support with min guard to min assist for balance (occasional UE support to catch balance): EO rocking with tall posture,static hold with EC no head movement, EC with head movements up<>down and left<>right.          Tandem Gait Limitations  outdoors: high knee marching down fwd/up bwd on grass hill x 3 laps each way with min guard to min assist;tandem walking along grass at top of hill, both forward/backward x 3 laps each way with min guard to min assist for balance.                                        PT Short Term Goals - 01/30/16 1059    PT SHORT TERM GOAL #1   Title STG's = LTG's           PT Long Term Goals - 02/14/16 [id number]    PT LONG TERM GOAL #1   Title Pt will independently perform HEP to maximize functional gains made in PT.  (Target date: 02/27/16)   Status On-going   PT LONG TERM GOAL #2   Title Complete FGA and improve score by 8 points from baseline ot indicate significantly improved dynamic gait stability.    Baseline 5/2: baseline FGA =  20/30   Status On-going   PT LONG TERM GOAL #3   Title Pt will improve SOT composite score from 55% to > / = 63% to improved use of multi-sensory input for balance.    Status On-going   PT LONG TERM GOAL #4   Title Pt will improve SOT visual score from 56% to > / = 66% to indicate significantly improved use of visual input for balance.    Status On-going   PT LONG TERM GOAL #5   Title Pt will independently ambulate > 500' over indoor surfaces, through narrow hallways, with effective obstacle negotiation to indicate improved attention to R visual field during functional mobility.   Status On-going               Plan - 02/23/16 1151    Clinical Impression Statement Continued to address high level balance actvities today with emphasis on vestibular system imput. Pt making great progress toward goals.   Rehab Potential Good   Clinical Impairments Affecting Rehab Potential time since CVA (> 1 year)   PT Frequency 2x / week   PT Duration 4 weeks   PT Treatment/Interventions ADLs/Self Care Home Management;DME Instruction;Balance training;Neuromuscular re-education;Visual/perceptual remediation/compensation;Vestibular;Therapeutic exercise;Therapeutic activities;Functional mobility training;Stair training;Gait training;Patient/family education   PT Next Visit Plan address LTGs next week   Consulted and Agree with Plan of Care Patient      Patient will benefit from skilled therapeutic intervention in order to improve the following deficits and impairments:  Abnormal gait, Decreased balance, Impaired vision/preception, Decreased coordination, Impaired sensation  Visit Diagnosis: Other abnormalities of gait and mobility  Unsteadiness on feet     Problem List Patient Active Problem List   Diagnosis Date Noted  . Type 2 diabetes mellitus with circulatory disorder ([place]) 01/14/2016  . On amiodarone therapy 03/10/2015  . Stroke ([place]) 12/31/2014  . Dysarthria 12/30/2014  . TIA  (transient ischemic attack) 12/30/2014  . Chronic systolic heart failure ([place]) 12/28/2014  . CAD (coronary artery disease), autologous vein bypass graft 02/02/2014  . Type II diabetes mellitus ([place]) 02/02/2014  . Paroxysmal atrial fibrillation ([place]) 02/02/2014  . Essential hypertension 02/02/2014  . Chronic anticoagulation 02/02/2014    Willow Ora, PTA, [facility] [address] [place], [place] [address] [phone] 02/24/2016, 7:41 PM   Name: Walter [name] MRN: [id number] Date of Birth: 09/07/1946

## 2016-02-28 ENCOUNTER — Ambulatory Visit: Payer: Medicare Other | Admitting: Physical Therapy

## 2016-02-28 DIAGNOSIS — R2681 Unsteadiness on feet: Secondary | ICD-10-CM | POA: Diagnosis not present

## 2016-02-28 DIAGNOSIS — R2689 Other abnormalities of gait and mobility: Secondary | ICD-10-CM

## 2016-02-28 NOTE — Therapy (Signed)
[facility] [address] [place], [place], [address] Phone: [phone]   Fax:  [phone]  Physical Therapy Treatment and Discharge Summary  Patient Details  Name: Walter [name] MRN: [id number] Date of Birth: 1946/08/30 Referring Provider: Rosalin Hawking, MD  Encounter Date: 02/28/2016      PT End of Session - 02/28/16 1913    Visit Number 8   Number of Visits 9   Date for PT Re-Evaluation 03/30/16   Authorization Type Medicare Traditional primary; [place] Patent examiner; BCBS tertiary   Authorization Time Period G Codes  required   [employer] Time 0930   PT Stop Time [phone]   PT Time Calculation (min) 25 min   Activity Tolerance Patient tolerated treatment well   Behavior During Therapy WFL for tasks assessed/performed      Past Medical History  Diagnosis Date  . Chronic kidney disease   . Pleural effusion   . Abdominal pain   . Gum disease   . Colon polyp   . Diabetes mellitus   . Cancer [facility])     prostate  . Stroke [facility])     Past Surgical History  Procedure Laterality Date  . Coronary artery bypass graft    . Colon surgery    . Cataract extraction  2006 and 2007    bilateral  . Colonoscopy      There were no vitals filed for this visit.      Subjective Assessment - 02/28/16 0935    Subjective "I can really tell a big difference since starting therapy. I mean, I used to walk into the same door frames over and over again at home. It has helped."  Pt reports no falls, no new complaints. Pt feels prepared for discharged from PT today.   Pertinent History Likes to be called "Walter [name]". PMH significant for: CVA, HTN, DM II, CAD s/p CABG, CHF, hemicolectomy   Patient Stated Goals "To find out what is wrong with my balance and what I can do about it."   Currently in Pain? No/denies            [facility] PT Assessment - 02/28/16 0001    Functional Gait  Assessment   Gait assessed  Yes   Gait Level Surface  Walks 20 ft in less than 5.5 sec, no assistive devices, good speed, no evidence for imbalance, normal gait pattern, deviates no more than 6 in outside of the 12 in walkway width.   Change in Gait Speed Able to smoothly change walking speed without loss of balance or gait deviation. Deviate no more than 6 in outside of the 12 in walkway width.   Gait with Horizontal Head Turns Performs head turns smoothly with no change in gait. Deviates no more than 6 in outside 12 in walkway width   Gait with Vertical Head Turns Performs head turns with no change in gait. Deviates no more than 6 in outside 12 in walkway width.   Gait and Pivot Turn Pivot turns safely within 3 sec and stops quickly with no loss of balance.   Step Over Obstacle Is able to step over 2 stacked shoe boxes taped together (9 in total height) without changing gait speed. No evidence of imbalance.   Gait with Narrow Base of Support Ambulates 4-7 steps.   Gait with Eyes Closed Walks 20 ft, uses assistive device, slower speed, mild gait deviations, deviates 6-10 in outside 12 in walkway width. Ambulates 20 ft in less than 9  sec but greater than 7 sec.   Ambulating Backwards Walks 20 ft, no assistive devices, good speed, no evidence for imbalance, normal gait   Steps Alternating feet, no rail.   Total Score 27                     [facility] Adult PT Treatment/Exercise - 03/26/2016 0001    Ambulation/Gait   Ambulation/Gait Yes   Ambulation/Gait Assistance 7: Independent   Ambulation Distance (Feet) 1150 Feet   Assistive device None   Gait Pattern Within Functional Limits   Ambulation Surface Level;Unlevel;Indoor;Outdoor;Paved   Gait Comments No overt LOB with functional head turns.   High Level Balance   High Level Balance Activities Backward walking;Direction changes;Turns;Sudden stops;Head turns;Tandem walking;Negotitating around obstacles;Negotiating over obstacles   High Level Balance Comments No overt LOB with high level  gait/balance activities, with exception of tandem gait (see FGA for details).                PT Education - March 26, 2016 1909    Education provided Yes   Education Details PT goals, findings, progress, and DC plan.    Person(s) Educated Patient   Methods Explanation   Comprehension Verbalized understanding          PT Short Term Goals - 01/30/16 1059    PT SHORT TERM GOAL #1   Title STG's = LTG's           PT Long Term Goals - 03/26/16 0943    PT LONG TERM GOAL #1   Title Pt will independently perform HEP to maximize functional gains made in PT.  (Target date: 02/27/16)   Status Achieved   PT LONG TERM GOAL #2   Title Complete FGA and improve score by 8 points from baseline ot indicate significantly improved dynamic gait stability.    Baseline 5/2: baseline FGA = 20/30   03-27-23: FGA = 27/30   Status Partially Met   PT LONG TERM GOAL #3   Title Pt will improve SOT composite score from 55% to > / = 63% to improved use of multi-sensory input for balance.    Baseline Met 5/9.   Status Achieved   PT LONG TERM GOAL #4   Title Pt will improve SOT visual score from 56% to > / = 66% to indicate significantly improved use of visual input for balance.    Baseline Met 5/9.   Status Achieved   PT LONG TERM GOAL #5   Title Pt will independently ambulate > 500' over indoor surfaces, through narrow hallways, with effective obstacle negotiation to indicate improved attention to R visual field during functional mobility.   Baseline Met 2023/03/27.   Status Achieved               Plan - 2016-03-26 1913    Clinical Impression Statement Patient has met all goals and demonstrates independence with community mobility. Pt will therefore be discharged from outpatient PT at this time. Pt in full agreement with DC plan.    Consulted and Agree with Plan of Care Patient      Patient will benefit from skilled therapeutic intervention in order to improve the following deficits and impairments:      Visit Diagnosis: Other abnormalities of gait and mobility  Unsteadiness on feet       G-Codes - 2016-03-26 1912    Functional Assessment Tool Used SOT composite score = 63%   (age/height normative value = 68%)   Functional Limitation Mobility:  Walking and moving around   Mobility: Walking and Moving Around Goal Status [phone]) At least 1 percent but less than 20 percent impaired, limited or restricted   Mobility: Walking and Moving Around Discharge Status [phone]) At least 1 percent but less than 20 percent impaired, limited or restricted      Problem List Patient Active Problem List   Diagnosis Date Noted  . Type 2 diabetes mellitus with circulatory disorder ([place]) 01/14/2016  . On amiodarone therapy 03/10/2015  . Stroke ([place]) 12/31/2014  . Dysarthria 12/30/2014  . TIA (transient ischemic attack) 12/30/2014  . Chronic systolic heart failure ([place]) 12/28/2014  . CAD (coronary artery disease), autologous vein bypass graft 02/02/2014  . Type II diabetes mellitus ([place]) 02/02/2014  . Paroxysmal atrial fibrillation ([place]) 02/02/2014  . Essential hypertension 02/02/2014  . Chronic anticoagulation 02/02/2014   PHYSICAL THERAPY DISCHARGE SUMMARY  Visits from Start of Care: 8  Current functional level related to goals / functional outcomes: See above goals and outcomes.   Remaining deficits: No significant deficits remaining. Pt compensating well for decreased attention to R visual field.   Education / Equipment: HEP and progression; education on increasing visual attention during mobility.  Plan: Patient agrees to discharge.  Patient goals were met. Patient is being discharged due to meeting the stated rehab goals.  ?????         Billie Ruddy, PT, DPT [facility] [address] [place], [place], [address] Phone: [phone]   Fax:  [phone] 02/28/2016, 7:16 PM   Name: Kariem Wolfson MRN: [id number] Date of Birth:  [birth year]

## 2016-03-01 ENCOUNTER — Ambulatory Visit: Payer: Medicare Other | Admitting: Physical Therapy

## 2016-03-03 ENCOUNTER — Other Ambulatory Visit: Payer: Self-pay | Admitting: Interventional Cardiology

## 2016-03-17 ENCOUNTER — Other Ambulatory Visit: Payer: Self-pay | Admitting: Interventional Cardiology

## 2016-04-09 DIAGNOSIS — R102 Pelvic and perineal pain: Secondary | ICD-10-CM | POA: Diagnosis not present

## 2016-04-16 DIAGNOSIS — H43811 Vitreous degeneration, right eye: Secondary | ICD-10-CM | POA: Diagnosis not present

## 2016-04-16 DIAGNOSIS — E113292 Type 2 diabetes mellitus with mild nonproliferative diabetic retinopathy without macular edema, left eye: Secondary | ICD-10-CM | POA: Diagnosis not present

## 2016-04-16 DIAGNOSIS — H52203 Unspecified astigmatism, bilateral: Secondary | ICD-10-CM | POA: Diagnosis not present

## 2016-04-16 DIAGNOSIS — Z961 Presence of intraocular lens: Secondary | ICD-10-CM | POA: Diagnosis not present

## 2016-04-17 ENCOUNTER — Ambulatory Visit (INDEPENDENT_AMBULATORY_CARE_PROVIDER_SITE_OTHER): Payer: Medicare Other | Admitting: Neurology

## 2016-04-17 ENCOUNTER — Encounter: Payer: Self-pay | Admitting: Neurology

## 2016-04-17 VITALS — BP 104/58 | HR 56 | Ht 67.0 in | Wt 217.4 lb

## 2016-04-17 DIAGNOSIS — I10 Essential (primary) hypertension: Secondary | ICD-10-CM | POA: Diagnosis not present

## 2016-04-17 DIAGNOSIS — Z7901 Long term (current) use of anticoagulants: Secondary | ICD-10-CM

## 2016-04-17 DIAGNOSIS — E785 Hyperlipidemia, unspecified: Secondary | ICD-10-CM | POA: Diagnosis not present

## 2016-04-17 DIAGNOSIS — I63412 Cerebral infarction due to embolism of left middle cerebral artery: Secondary | ICD-10-CM | POA: Diagnosis not present

## 2016-04-17 DIAGNOSIS — I25810 Atherosclerosis of coronary artery bypass graft(s) without angina pectoris: Secondary | ICD-10-CM

## 2016-04-17 DIAGNOSIS — E1159 Type 2 diabetes mellitus with other circulatory complications: Secondary | ICD-10-CM | POA: Diagnosis not present

## 2016-04-17 DIAGNOSIS — I480 Paroxysmal atrial fibrillation: Secondary | ICD-10-CM

## 2016-04-17 NOTE — Patient Instructions (Signed)
-   continue Xarelto and crestor for stroke prevention - follow up with cardiology Dr. Tamala Julian for afib  - Follow up with your primary care physician for stroke risk factor modification. Recommend maintain blood pressure goal <130/80, diabetes with hemoglobin A1c goal below 6.5% and lipids with LDL cholesterol goal below 70 mg/dL.  - healthy diabetic diet and regular exercise - check BP and glucose at home and record - follow up in 6 months.
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
## 2016-04-25 DIAGNOSIS — Z5181 Encounter for therapeutic drug level monitoring: Secondary | ICD-10-CM | POA: Diagnosis not present

## 2016-04-25 DIAGNOSIS — E1122 Type 2 diabetes mellitus with diabetic chronic kidney disease: Secondary | ICD-10-CM | POA: Diagnosis not present

## 2016-04-25 DIAGNOSIS — Z794 Long term (current) use of insulin: Secondary | ICD-10-CM | POA: Diagnosis not present

## 2016-04-25 DIAGNOSIS — N182 Chronic kidney disease, stage 2 (mild): Secondary | ICD-10-CM | POA: Diagnosis not present

## 2016-04-26 DIAGNOSIS — S70311A Abrasion, right thigh, initial encounter: Secondary | ICD-10-CM | POA: Diagnosis not present

## 2016-04-26 DIAGNOSIS — S80811A Abrasion, right lower leg, initial encounter: Secondary | ICD-10-CM | POA: Diagnosis not present

## 2016-04-26 DIAGNOSIS — W19XXXA Unspecified fall, initial encounter: Secondary | ICD-10-CM | POA: Diagnosis not present

## 2016-05-24 DIAGNOSIS — K5732 Diverticulitis of large intestine without perforation or abscess without bleeding: Secondary | ICD-10-CM | POA: Diagnosis not present

## 2016-06-01 DIAGNOSIS — R3915 Urgency of urination: Secondary | ICD-10-CM | POA: Diagnosis not present

## 2016-06-18 ENCOUNTER — Other Ambulatory Visit: Payer: Self-pay | Admitting: Interventional Cardiology

## 2016-06-18 MED ORDER — METOPROLOL SUCCINATE ER 50 MG PO TB24: ORAL_TABLET | ORAL | 1 refills | Status: DC

## 2016-06-18 MED ORDER — METOPROLOL SUCCINATE ER 50 MG PO TB24
ORAL_TABLET | ORAL | 0 refills | Status: DC
Start: 2016-06-18 — End: 2017-01-01

## 2016-07-04 ENCOUNTER — Ambulatory Visit (INDEPENDENT_AMBULATORY_CARE_PROVIDER_SITE_OTHER): Payer: Medicare Other | Admitting: Interventional Cardiology

## 2016-07-04 ENCOUNTER — Encounter: Payer: Self-pay | Admitting: Interventional Cardiology

## 2016-07-04 VITALS — BP 112/60 | HR 62 | Ht 67.0 in | Wt 209.0 lb

## 2016-07-04 DIAGNOSIS — I10 Essential (primary) hypertension: Secondary | ICD-10-CM

## 2016-07-04 DIAGNOSIS — I25718 Atherosclerosis of autologous vein coronary artery bypass graft(s) with other forms of angina pectoris: Secondary | ICD-10-CM | POA: Diagnosis not present

## 2016-07-04 DIAGNOSIS — I5022 Chronic systolic (congestive) heart failure: Secondary | ICD-10-CM | POA: Diagnosis not present

## 2016-07-04 DIAGNOSIS — Z7901 Long term (current) use of anticoagulants: Secondary | ICD-10-CM

## 2016-07-04 DIAGNOSIS — I25810 Atherosclerosis of coronary artery bypass graft(s) without angina pectoris: Secondary | ICD-10-CM

## 2016-07-04 DIAGNOSIS — G451 Carotid artery syndrome (hemispheric): Secondary | ICD-10-CM

## 2016-07-04 DIAGNOSIS — Z79899 Other long term (current) drug therapy: Secondary | ICD-10-CM

## 2016-07-04 DIAGNOSIS — I480 Paroxysmal atrial fibrillation: Secondary | ICD-10-CM

## 2016-07-04 NOTE — Patient Instructions (Signed)
Medication Instructions:  Your physician recommends that you continue on your current medications as directed. Please refer to the Current Medication list given to you today.   Labwork: Tsh and Cmet today  Your physician recommends that you return for lab work in: 6 months Tsh, Hepatic   Testing/Procedures: A chest x-ray takes a picture of the organs and structures inside the chest, including the heart, lungs, and blood vessels. This test can show several things, including, whether the heart is enlarges; whether fluid is building up in the lungs; and whether pacemaker / defibrillator leads are still in place. ( To be done in 6 months @ [facility] [address] floor)  Follow-Up: Your physician wants you to follow-up in: 6 months with Dr.Smith You will receive a reminder letter in the mail two months in advance. If you don't receive a letter, please call our office to schedule the follow-up appointment.   Any Other Special Instructions Will Be Listed Below (If Applicable).     If you need a refill on your cardiac medications before your next appointment, please call your pharmacy.

## 2016-07-04 NOTE — Progress Notes (Signed)
Cardiology Office Note    Date:  07/04/2016   ID:  Heinrich, Gerdes 1946-02-12, MRN [id number]  PCP:  Irven Shelling, MD  Cardiologist: Sinclair Grooms, MD   Chief Complaint  Patient presents with  . Coronary Artery Disease    History of Present Illness:  Walter [name] is a 70 y.o. male who presents for paroxysmal atrial fibrillation, CAD with prior CABG, hypertension, CKD 2, chronic anticoagulation therapy, and amiodarone therapy.  No complaints and denies bleeding and neurologic symptoms.   Past Medical History:  Diagnosis Date  . Abdominal pain   . Cancer [facility])    prostate  . Chronic kidney disease   . Colon polyp   . Diabetes mellitus   . Gum disease   . Pleural effusion   . Stroke [facility])     Past Surgical History:  Procedure Laterality Date  . CATARACT EXTRACTION  2006 and 2007   bilateral  . COLON SURGERY    . COLONOSCOPY    . CORONARY ARTERY BYPASS GRAFT      Current Medications: Outpatient Medications Prior to Visit  Medication Sig Dispense Refill  . amiodarone (PACERONE) 200 MG tablet Take 1 tablet (200 mg total) by mouth daily. 90 tablet 2  . buPROPion (WELLBUTRIN XL) 150 MG 24 hr tablet Take 150 mg by mouth daily.  0  . escitalopram (LEXAPRO) 20 MG tablet Take 20 mg by mouth daily.      Marland Kitchen JANUMET XR 50-1000 MG TB24 Take 1 tablet by mouth 2 (two) times daily.     Marland Kitchen lisinopril (PRINIVIL,ZESTRIL) 20 MG tablet TAKE 1 TABLET DAILY 90 tablet 1  . metoprolol succinate (TOPROL-XL) 50 MG 24 hr tablet Take 75 mg by mouth daily. Take with or immediately following a meal 15 tablet 0  . Omega-3-acid Ethyl Esters (LOVAZA PO) Take 1 tablet by mouth 2 (two) times daily.     Glory Rosebush VERIO test strip     . rivaroxaban (XARELTO) 20 MG TABS tablet Take 1 tablet (20 mg total) by mouth daily. 90 tablet 2  . rosuvastatin (CRESTOR) 10 MG tablet Take 10 mg by mouth at bedtime.     . TRESIBA FLEXTOUCH 200 UNIT/ML SOPN Inject 80 Units into the skin daily.    Marland Kitchen VIAGRA  100 MG tablet Take 1 tablet by mouth daily as needed for erectile dysfunction. ED    . TOVIAZ 4 MG TB24 tablet Take 4 mg by mouth daily.    Marland Kitchen LOVAZA 1 g capsule      No facility-administered medications prior to visit.      Allergies:   Review of patient's allergies indicates no known allergies.   Social History   Social History  . Marital status: Married    Spouse name: N/A  . Number of children: N/A  . Years of education: N/A   Social History Main Topics  . Smoking status: Former Research scientist (life sciences)  . Smokeless tobacco: Never Used  . Alcohol use 1.8 oz/week    1 Glasses of wine, 1 Cans of beer, 1 Shots of liquor per week     Comment: social  . Drug use:     Types: Marijuana     Comment: frequently  . Sexual activity: Not Asked   Other Topics Concern  . None   Social History Narrative  . None     Family History:  The patient's family history includes Angina in his mother; Coronary artery disease in his father; Diabetes in his  father; Stroke in his father.   ROS:   Please see the history of present illness.    Still having difficulty with balance  All other systems reviewed and are negative.   PHYSICAL EXAM:   VS:  BP 112/60   Pulse 62   Ht 5\' 7"  (1.702 m)   Wt 209 lb (94.8 kg)   BMI 32.73 kg/m    GEN: Well nourished, well developed, in no acute distress  HEENT: normal  Neck: no JVD, carotid bruits, or masses Cardiac: RRR; no murmurs, rubs, or gallops,no edema  Respiratory:  clear to auscultation bilaterally, normal work of breathing GI: soft, nontender, nondistended, + BS MS: no deformity or atrophy  Skin: warm and dry, no rash Neuro:  Alert and Oriented x 3, Strength and sensation are intact Psych: euthymic mood, full affect  Wt Readings from Last 3 Encounters:  07/04/16 209 lb (94.8 kg)  04/17/16 217 lb 6.4 oz (98.6 kg)  01/13/16 218 lb 9.6 oz (99.2 kg)      Studies/Labs Reviewed:   EKG:  EKG  Not performed  Recent Labs: 12/22/2015: ALT 41; TSH 2.68    Lipid Panel    Component Value Date/Time   CHOL 121 12/31/2014 0555   TRIG 208 (H) 12/31/2014 0555   HDL 35 (L) 12/31/2014 0555   CHOLHDL 3.5 12/31/2014 0555   VLDL 42 (H) 12/31/2014 0555   LDLCALC 44 12/31/2014 0555    Additional studies/ records that were reviewed today include:  No new data    ASSESSMENT:    1. Coronary artery disease involving autologous vein coronary bypass graft with other forms of angina pectoris (HCC)   2. Paroxysmal atrial fibrillation ([place])   3. Essential hypertension   4. Chronic systolic heart failure ([place])   5. Hemispheric carotid artery syndrome   6. Chronic anticoagulation   7. On amiodarone therapy      PLAN:  In order of problems listed above:  1. Asymptomatic with reference to coronary disease. 2. No prolonged episodes of tachycardia on amiodarone. 3. Very well controlled. Low salt diet reiterated. 4. Systolic dysfunction has improved back to an EF of 50-55% after control of atrial fibrillation. 5. No new neurological complaints 6. No bleeding on anticoagulation therapy. 7. TSH and see med is done today. I'll return to TSH, liver panel, and the lateral chest x-ray will be done in 6 months.    Medication Adjustments/Labs and Tests Ordered: Current medicines are reviewed at length with the patient today.  Concerns regarding medicines are outlined above.  Medication changes, Labs and Tests ordered today are listed in the Patient Instructions below. There are no Patient Instructions on file for this visit.   Signed, Sinclair Grooms, MD  07/04/2016 1:57 PM    [facility] [place], [place], [place]  [address] Phone: [phone]; Fax: [phone]

## 2016-07-05 ENCOUNTER — Telehealth: Payer: Self-pay | Admitting: *Deleted

## 2016-07-05 ENCOUNTER — Telehealth: Payer: Self-pay | Admitting: Interventional Cardiology

## 2016-07-05 DIAGNOSIS — R7989 Other specified abnormal findings of blood chemistry: Secondary | ICD-10-CM

## 2016-07-05 LAB — COMPREHENSIVE METABOLIC PANEL
ALBUMIN: 4 g/dL (ref 3.6–5.1)
ALT: 30 U/L (ref 9–46)
AST: 30 U/L (ref 10–35)
Alkaline Phosphatase: 34 U/L — ABNORMAL LOW (ref 40–115)
BUN: 26 mg/dL — AB (ref 7–25)
CHLORIDE: 102 mmol/L (ref 98–110)
CO2: 25 mmol/L (ref 20–31)
CREATININE: 1.54 mg/dL — AB (ref 0.70–1.18)
Calcium: 9.7 mg/dL (ref 8.6–10.3)
Glucose, Bld: 130 mg/dL — ABNORMAL HIGH (ref 65–99)
POTASSIUM: 4.8 mmol/L (ref 3.5–5.3)
SODIUM: 139 mmol/L (ref 135–146)
TOTAL PROTEIN: 6.6 g/dL (ref 6.1–8.1)
Total Bilirubin: 0.7 mg/dL (ref 0.2–1.2)

## 2016-07-05 LAB — TSH: TSH: 2.08 m[IU]/L (ref 0.40–4.50)

## 2016-07-05 NOTE — Telephone Encounter (Signed)
New message    Pt returning nurse call for results.

## 2016-07-05 NOTE — Telephone Encounter (Signed)
-----   Message from Belva Crome, MD sent at 07/05/2016  8:13 AM EDT ----- Let the patient know there is mild kidney impairment compared to prior. All other labs okay. Please repeat BMET in 2 weeks. Increae fluid intake. A copy will be sent to Irven Shelling, MD

## 2016-07-05 NOTE — Telephone Encounter (Signed)
Informed pt of lab results. Pt verbalized understanding. Scheduled labs for 10/12.

## 2016-07-05 NOTE — Telephone Encounter (Signed)
Informed pt of lab results. Pt verbalized understanding. Labs scheduled for 10/12.

## 2016-07-19 ENCOUNTER — Other Ambulatory Visit: Payer: Medicare Other | Admitting: *Deleted

## 2016-07-19 DIAGNOSIS — R7989 Other specified abnormal findings of blood chemistry: Secondary | ICD-10-CM | POA: Diagnosis not present

## 2016-07-19 LAB — BASIC METABOLIC PANEL
BUN: 22 mg/dL (ref 7–25)
CO2: 27 mmol/L (ref 20–31)
CREATININE: 1.34 mg/dL — AB (ref 0.70–1.18)
Calcium: 9.1 mg/dL (ref 8.6–10.3)
Chloride: 104 mmol/L (ref 98–110)
GLUCOSE: 130 mg/dL — AB (ref 65–99)
POTASSIUM: 4.7 mmol/L (ref 3.5–5.3)
Sodium: 139 mmol/L (ref 135–146)

## 2016-08-15 DIAGNOSIS — Z23 Encounter for immunization: Secondary | ICD-10-CM | POA: Diagnosis not present

## 2016-08-26 DIAGNOSIS — H8110 Benign paroxysmal vertigo, unspecified ear: Secondary | ICD-10-CM | POA: Diagnosis not present

## 2016-09-02 ENCOUNTER — Other Ambulatory Visit: Payer: Self-pay | Admitting: Interventional Cardiology

## 2016-10-02 IMAGING — CR DG CHEST 2V
2 series · 2 of 2 positions shown · non-contrast
Comparison: 05/09/2011

CLINICAL DATA: Amiodarone therapy

EXAM:
CHEST  2 VIEW

[w chest pa]
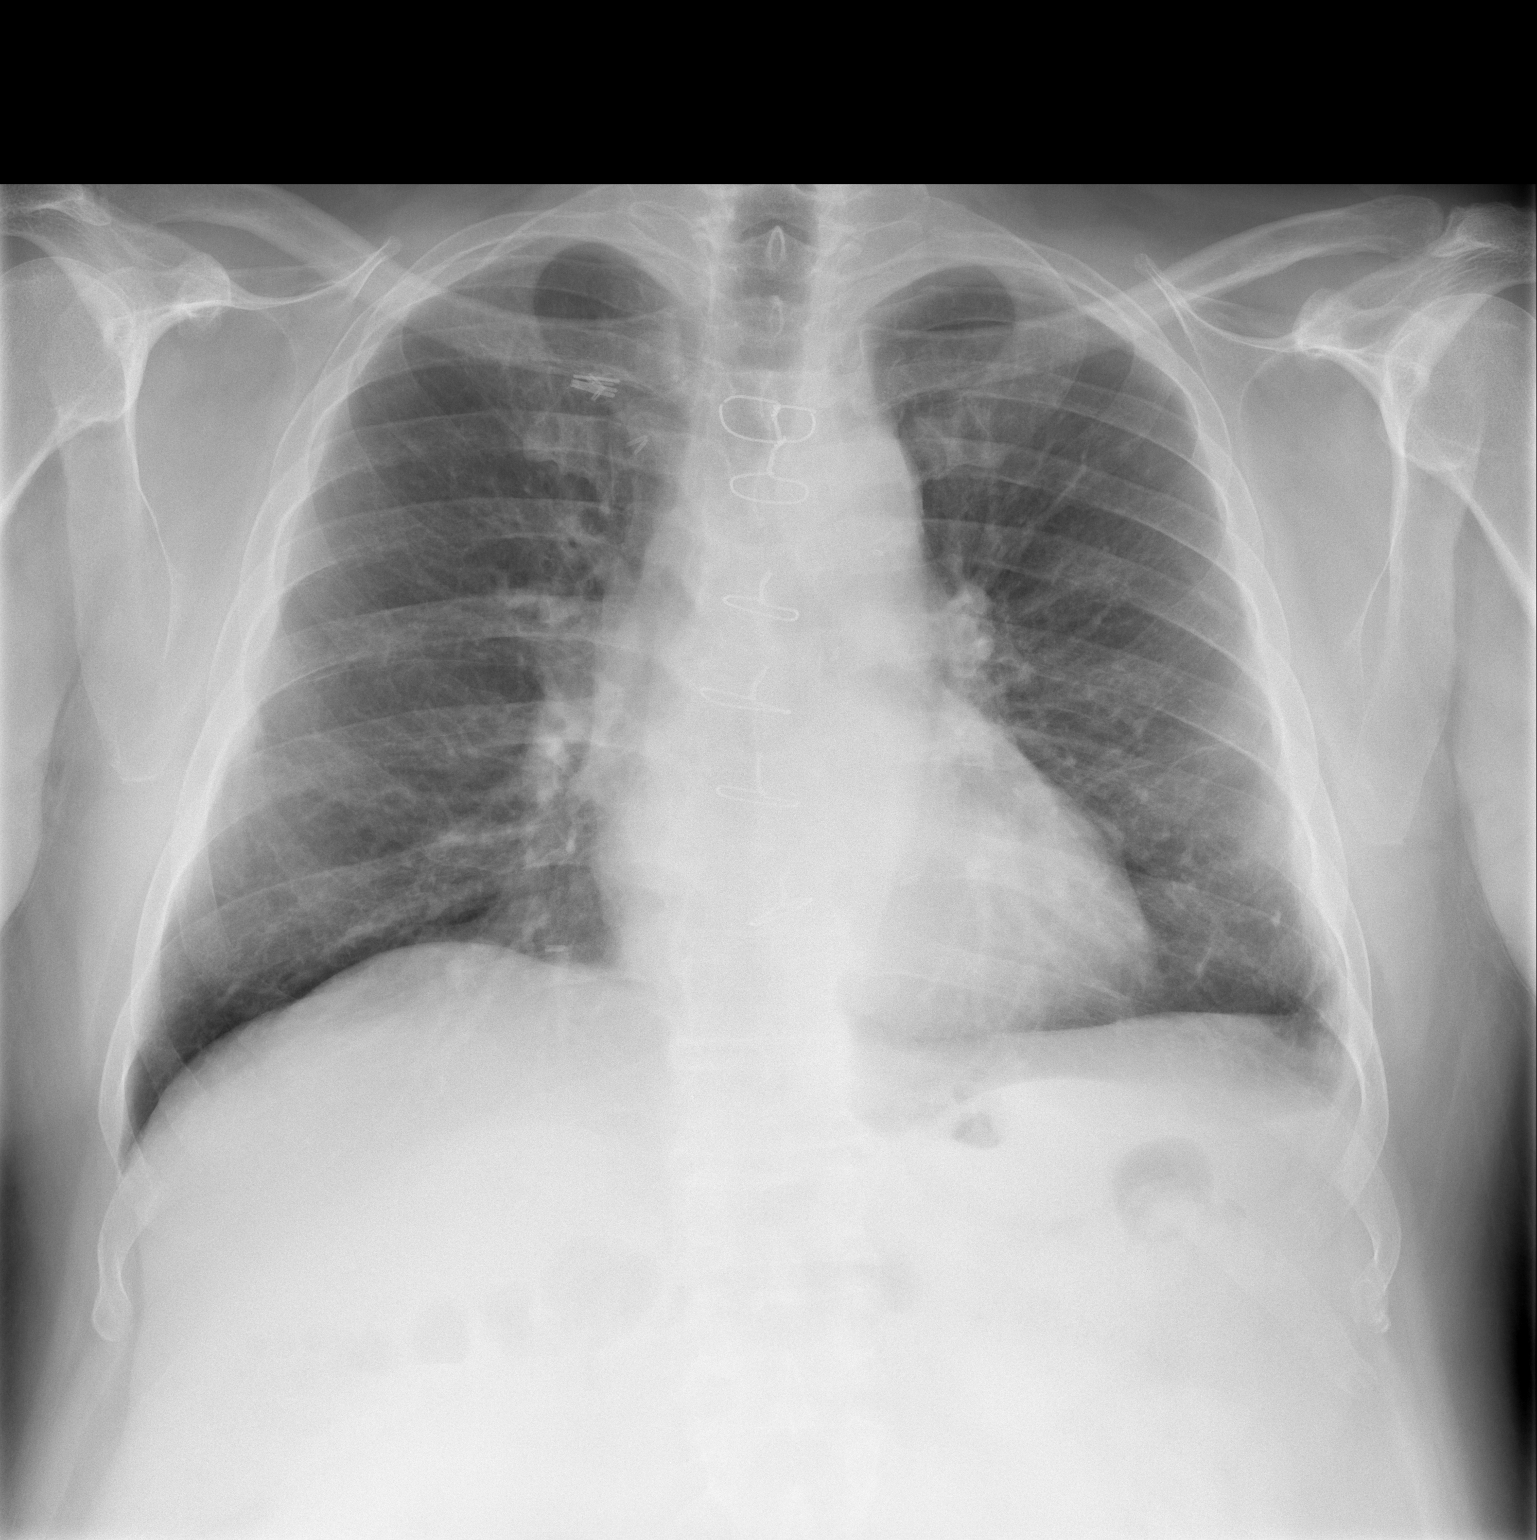

[w chest lat]
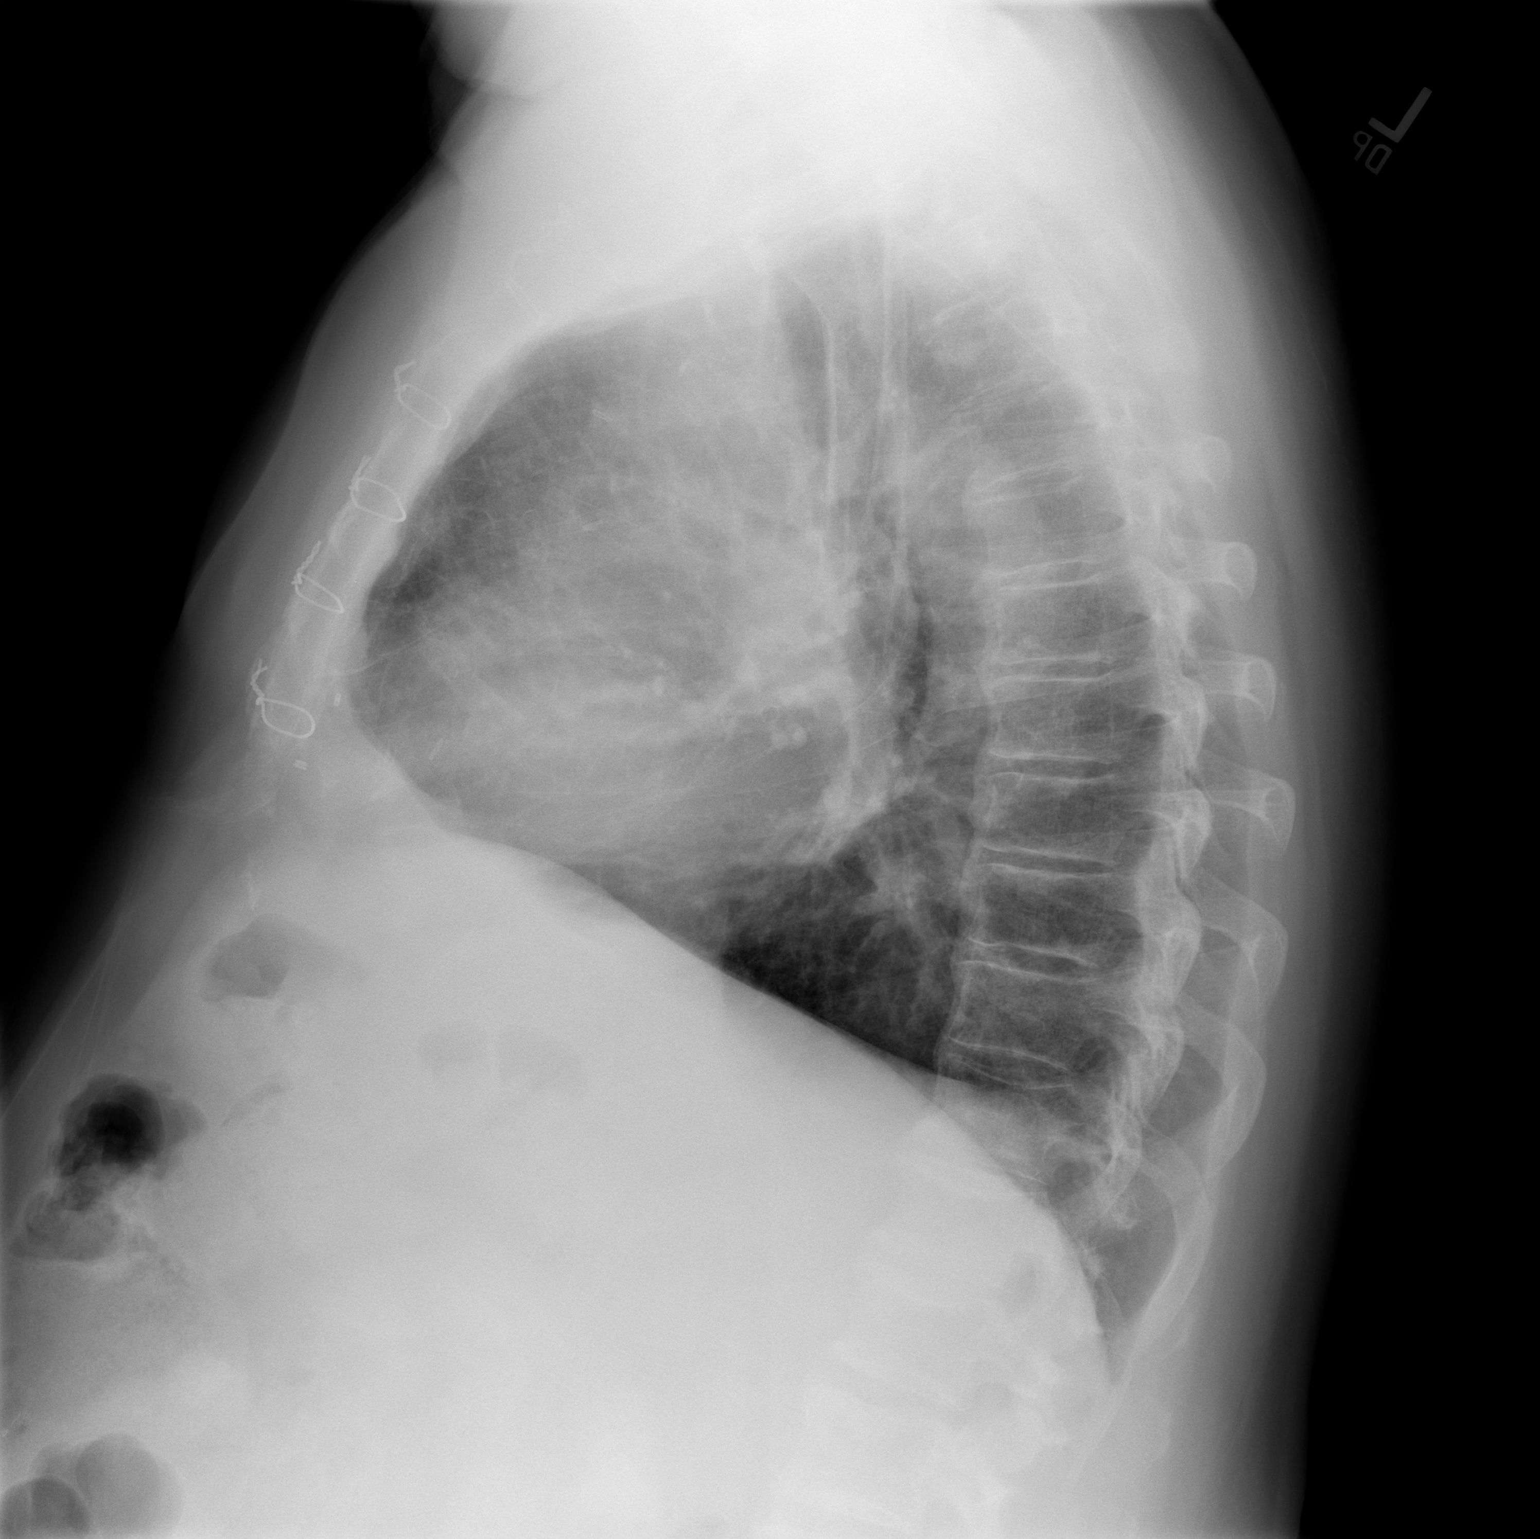

[2 of 2 positions shown; findings below may reference images not displayed]

FINDINGS: Normal heart size. Lungs are under aerated. Stable pleural reaction
at the left base. Lungs are grossly clear. Postoperative changes.
IMPRESSION: No active cardiopulmonary disease.

## 2016-10-17 ENCOUNTER — Other Ambulatory Visit: Payer: Self-pay | Admitting: Physician Assistant

## 2016-10-17 DIAGNOSIS — I4891 Unspecified atrial fibrillation: Secondary | ICD-10-CM

## 2016-10-18 ENCOUNTER — Ambulatory Visit: Payer: Medicare Other | Admitting: Neurology

## 2016-10-31 DIAGNOSIS — N182 Chronic kidney disease, stage 2 (mild): Secondary | ICD-10-CM | POA: Diagnosis not present

## 2016-10-31 DIAGNOSIS — Z794 Long term (current) use of insulin: Secondary | ICD-10-CM | POA: Diagnosis not present

## 2016-10-31 DIAGNOSIS — Z5181 Encounter for therapeutic drug level monitoring: Secondary | ICD-10-CM | POA: Diagnosis not present

## 2016-10-31 DIAGNOSIS — E1122 Type 2 diabetes mellitus with diabetic chronic kidney disease: Secondary | ICD-10-CM | POA: Diagnosis not present

## 2016-10-31 DIAGNOSIS — Z79899 Other long term (current) drug therapy: Secondary | ICD-10-CM | POA: Diagnosis not present

## 2016-11-09 ENCOUNTER — Other Ambulatory Visit: Payer: Self-pay | Admitting: Interventional Cardiology

## 2016-11-28 ENCOUNTER — Ambulatory Visit: Payer: Medicare Other | Admitting: Neurology

## 2016-12-14 ENCOUNTER — Other Ambulatory Visit: Payer: Self-pay | Admitting: Interventional Cardiology

## 2016-12-20 ENCOUNTER — Encounter: Payer: Self-pay | Admitting: Interventional Cardiology

## 2016-12-30 NOTE — Progress Notes (Signed)
Cardiology Office Note    Date:  01/01/2017   ID:  Nahom, Carfagno 08-26-1946, MRN [id number]  PCP:  Irven Shelling, MD  Cardiologist: Sinclair Grooms, MD   Chief Complaint  Patient presents with  . Coronary Artery Disease  . Atrial Fibrillation    History of Present Illness:  Walter [name] is a 71 y.o. male who presents for paroxysmal atrial fibrillation, CAD with prior CABG, hypertension, CKD 2, chronic anticoagulation therapy, and amiodarone therapy.  No cardiac complaints. No nitroglycerin use. No palpitations or prolonged tachycardia. No episodes of syncope.  Past Medical History:  Diagnosis Date  . Abdominal pain   . Cancer [facility])    prostate  . Chronic kidney disease   . Colon polyp   . Diabetes mellitus   . Gum disease   . Pleural effusion   . Stroke [facility])     Past Surgical History:  Procedure Laterality Date  . CATARACT EXTRACTION  2006 and 2007   bilateral  . COLON SURGERY    . COLONOSCOPY    . CORONARY ARTERY BYPASS GRAFT      Current Medications: Outpatient Medications Prior to Visit  Medication Sig Dispense Refill  . amiodarone (PACERONE) 200 MG tablet Take 1 tablet (200 mg total) by mouth daily. 90 tablet 2  . amiodarone (PACERONE) 200 MG tablet Take 1 tablet (200 mg total) by mouth daily. 90 tablet 2  . buPROPion (WELLBUTRIN XL) 150 MG 24 hr tablet Take 150 mg by mouth daily.  0  . escitalopram (LEXAPRO) 20 MG tablet Take 20 mg by mouth daily.      Marland Kitchen JANUMET XR 50-1000 MG TB24 Take 1 tablet by mouth 2 (two) times daily.     Marland Kitchen lisinopril (PRINIVIL,ZESTRIL) 20 MG tablet TAKE 1 TABLET DAILY 90 tablet 2  . metoprolol succinate (TOPROL-XL) 50 MG 24 hr tablet Take one and one-half tablets by mouth daily with or immediately following a meal 135 tablet 1  . Omega-3-acid Ethyl Esters (LOVAZA PO) Take 1 tablet by mouth 2 (two) times daily.     Glory Rosebush VERIO test strip     . rosuvastatin (CRESTOR) 10 MG tablet Take 10 mg by mouth at bedtime.     .  TOVIAZ 8 MG TB24 tablet Take 8 mg by mouth daily.    . TRESIBA FLEXTOUCH 200 UNIT/ML SOPN Inject 80 Units into the skin daily.    Marland Kitchen VIAGRA 100 MG tablet Take 1 tablet by mouth daily as needed for erectile dysfunction. ED    . XARELTO 20 MG TABS tablet TAKE 1 TABLET DAILY 90 tablet 1  . metoprolol succinate (TOPROL-XL) 50 MG 24 hr tablet Take 75 mg by mouth daily. Take with or immediately following a meal (Patient not taking: Reported on 01/01/2017) 15 tablet 0   No facility-administered medications prior to visit.      Allergies:   Patient has no known allergies.   Social History   Social History  . Marital status: Married    Spouse name: N/A  . Number of children: N/A  . Years of education: N/A   Social History Main Topics  . Smoking status: Former Research scientist (life sciences)  . Smokeless tobacco: Never Used  . Alcohol use 1.8 oz/week    1 Glasses of wine, 1 Cans of beer, 1 Shots of liquor per week     Comment: social  . Drug use: Yes    Types: Marijuana     Comment: frequently  . Sexual  activity: Not Asked   Other Topics Concern  . None   Social History Narrative  . None     Family History:  The patient's family history includes Angina in his mother; Coronary artery disease in his father; Diabetes in his father; Stroke in his father.   ROS:   Please see the history of present illness.    Difficulty with balance, numbness and tingling in hands and feet that he feels is related to diabetes. To see Dr. Erlinda Hong today.  All other systems reviewed and are negative.   PHYSICAL EXAM:   VS:  BP 128/68   Pulse (!) 58   Ht 5\' 7"  (1.702 m)   Wt 205 lb (93 kg)   SpO2 98%   BMI 32.11 kg/m    GEN: Well nourished, well developed, in no acute distress  HEENT: normal  Neck: no JVD, carotid bruits, or masses Cardiac: RRR; no murmurs, rubs, or gallops,no edema . Pedal pulses are 2+ and symmetric in the radial and posterior tibial bilaterally. Respiratory:  clear to auscultation bilaterally, normal work  of breathing GI: soft, nontender, nondistended, + BS MS: no deformity or atrophy  Skin: warm and dry, no rash Neuro:  Alert and Oriented x 3, Strength and sensation are intact Psych: euthymic mood, full affect  Wt Readings from Last 3 Encounters:  01/01/17 205 lb (93 kg)  07/04/16 209 lb (94.8 kg)  04/17/16 217 lb 6.4 oz (98.6 kg)      Studies/Labs Reviewed:   EKG:  EKG  Normal sinus rhythm/sinus bradycardia, left atrial abnormality, left axis deviation, nonspecific T wave flattening.  Recent Labs: 07/04/2016: ALT 30; TSH 2.08 07/19/2016: BUN 22; Creat 1.34; Potassium 4.7; Sodium 139   Lipid Panel    Component Value Date/Time   CHOL 121 12/31/2014 0555   TRIG 208 (H) 12/31/2014 0555   HDL 35 (L) 12/31/2014 0555   CHOLHDL 3.5 12/31/2014 0555   VLDL 42 (H) 12/31/2014 0555   LDLCALC 44 12/31/2014 0555    Additional studies/ records that were reviewed today include:  No new data    ASSESSMENT:    1. 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 Paroxysmal atrial fibrillation ([place])   3. Chronic diastolic heart failure ([place])   4. On amiodarone therapy   5. Essential hypertension   6. Cerebrovascular accident (CVA) due to embolism of left middle cerebral artery ([place])   7. Type 2 diabetes mellitus with hyperosmolarity without coma, without long-term current use of insulin ([place])   8. Chronic anticoagulation   9. Other hyperlipidemia      PLAN:  In order of problems listed above:  1. Asymptomatic with reference to anginal complaints. 2. Perhaps an occasional episode of brief atrial fibrillation but nothing like he has had in the past. Things been markedly better on amiodarone therapy. 3. No evidence of volume overload. 4. No clinical evidence of toxicity on amiodarone. TSH, hepatic panel, and PA and lateral chest x-ray today. 5. Blood pressure target should be less than 130/90 mmHg. We are at target  today. 6. Not addressed 7. Followed in anticoagulation clinic 8. LDL target less than 70.    Medication Adjustments/Labs and Tests Ordered: Current medicines are reviewed at length with the patient today.  Concerns regarding medicines are outlined above.  Medication changes, Labs and Tests ordered today are listed in the Patient Instructions below. Patient Instructions  Medication Instructions:  None  Labwork: TSH and  Liver panel today  Testing/Procedures: A chest x-ray takes a picture of the organs and structures inside the chest, including the heart, lungs, and blood vessels. This test can show several things, including, whether the heart is enlarges; whether fluid is building up in the lungs; and whether pacemaker / defibrillator leads are still in place.   Follow-Up: Your physician wants you to follow-up in: 6 months with Dr. Tamala Julian.  You will receive a reminder letter in the mail two months in advance. If you don't receive a letter, please call our office to schedule the follow-up appointment.   Any Other Special Instructions Will Be Listed Below (If Applicable).     If you need a refill on your cardiac medications before your next appointment, please call your pharmacy.      Signed, Sinclair Grooms, MD  01/01/2017 10:07 AM    [place], [place],   [address] Phone: [phone]; Fax: [phone]

## 2017-01-01 ENCOUNTER — Encounter: Payer: Self-pay | Admitting: Interventional Cardiology

## 2017-01-01 ENCOUNTER — Ambulatory Visit (INDEPENDENT_AMBULATORY_CARE_PROVIDER_SITE_OTHER): Payer: Medicare Other | Admitting: Interventional Cardiology

## 2017-01-01 ENCOUNTER — Encounter (INDEPENDENT_AMBULATORY_CARE_PROVIDER_SITE_OTHER): Payer: Self-pay

## 2017-01-01 VITALS — BP 128/68 | HR 58 | Ht 67.0 in | Wt 205.0 lb

## 2017-01-01 DIAGNOSIS — I209 Angina pectoris, unspecified: Secondary | ICD-10-CM | POA: Diagnosis not present

## 2017-01-01 DIAGNOSIS — Z79899 Other long term (current) drug therapy: Secondary | ICD-10-CM

## 2017-01-01 DIAGNOSIS — I480 Paroxysmal atrial fibrillation: Secondary | ICD-10-CM

## 2017-01-01 DIAGNOSIS — I10 Essential (primary) hypertension: Secondary | ICD-10-CM | POA: Diagnosis not present

## 2017-01-01 DIAGNOSIS — I63412 Cerebral infarction due to embolism of left middle cerebral artery: Secondary | ICD-10-CM | POA: Diagnosis not present

## 2017-01-01 DIAGNOSIS — Z7901 Long term (current) use of anticoagulants: Secondary | ICD-10-CM

## 2017-01-01 DIAGNOSIS — E784 Other hyperlipidemia: Secondary | ICD-10-CM | POA: Diagnosis not present

## 2017-01-01 DIAGNOSIS — E1100 Type 2 diabetes mellitus with hyperosmolarity without nonketotic hyperglycemic-hyperosmolar coma (NKHHC): Secondary | ICD-10-CM

## 2017-01-01 DIAGNOSIS — I5032 Chronic diastolic (congestive) heart failure: Secondary | ICD-10-CM | POA: Diagnosis not present

## 2017-01-01 DIAGNOSIS — E7849 Other hyperlipidemia: Secondary | ICD-10-CM

## 2017-01-01 DIAGNOSIS — I25709 Atherosclerosis of coronary artery bypass graft(s), unspecified, with unspecified angina pectoris: Secondary | ICD-10-CM | POA: Diagnosis not present

## 2017-01-01 LAB — HEPATIC FUNCTION PANEL
ALK PHOS: 42 IU/L (ref 39–117)
ALT: 45 IU/L — ABNORMAL HIGH (ref 0–44)
AST: 50 IU/L — ABNORMAL HIGH (ref 0–40)
Albumin: 3.6 g/dL (ref 3.5–4.8)
BILIRUBIN TOTAL: 0.5 mg/dL (ref 0.0–1.2)
BILIRUBIN, DIRECT: 0.16 mg/dL (ref 0.00–0.40)
Total Protein: 6 g/dL (ref 6.0–8.5)

## 2017-01-01 LAB — TSH: TSH: 3.01 u[IU]/mL (ref 0.450–4.500)

## 2017-01-01 NOTE — Patient Instructions (Signed)
Medication Instructions:  None  Labwork: TSH and Liver panel today  Testing/Procedures: A chest x-ray takes a picture of the organs and structures inside the chest, including the heart, lungs, and blood vessels. This test can show several things, including, whether the heart is enlarges; whether fluid is building up in the lungs; and whether pacemaker / defibrillator leads are still in place.   Follow-Up: Your physician wants you to follow-up in: 6 months with Dr. Smith.  You will receive a reminder letter in the mail two months in advance. If you don't receive a letter, please call our office to schedule the follow-up appointment.   Any Other Special Instructions Will Be Listed Below (If Applicable).     If you need a refill on your cardiac medications before your next appointment, please call your pharmacy.   

## 2017-01-02 ENCOUNTER — Ambulatory Visit
Admission: RE | Admit: 2017-01-02 | Discharge: 2017-01-02 | Disposition: A | Discharge status: Home / Self Care | Payer: Medicare Other | Source: Ambulatory Visit | Attending: Interventional Cardiology | Admitting: Interventional Cardiology

## 2017-01-02 DIAGNOSIS — Z79899 Other long term (current) drug therapy: Secondary | ICD-10-CM

## 2017-01-02 DIAGNOSIS — I10 Essential (primary) hypertension: Secondary | ICD-10-CM | POA: Diagnosis not present

## 2017-01-02 DIAGNOSIS — C61 Malignant neoplasm of prostate: Secondary | ICD-10-CM | POA: Diagnosis not present

## 2017-01-02 DIAGNOSIS — N3281 Overactive bladder: Secondary | ICD-10-CM | POA: Diagnosis not present

## 2017-01-02 NOTE — Addendum Note (Signed)
Addended by: Campbell Riches on: 01/02/2017 01:16 PM   Modules accepted: Orders

## 2017-02-19 ENCOUNTER — Ambulatory Visit (INDEPENDENT_AMBULATORY_CARE_PROVIDER_SITE_OTHER): Payer: Medicare Other | Admitting: Neurology

## 2017-02-19 ENCOUNTER — Encounter: Payer: Self-pay | Admitting: Neurology

## 2017-02-19 VITALS — BP 101/55 | HR 54 | Wt 202.6 lb

## 2017-02-19 DIAGNOSIS — I25709 Atherosclerosis of coronary artery bypass graft(s), unspecified, with unspecified angina pectoris: Secondary | ICD-10-CM | POA: Diagnosis not present

## 2017-02-19 DIAGNOSIS — E785 Hyperlipidemia, unspecified: Secondary | ICD-10-CM

## 2017-02-19 DIAGNOSIS — I63412 Cerebral infarction due to embolism of left middle cerebral artery: Secondary | ICD-10-CM

## 2017-02-19 DIAGNOSIS — I10 Essential (primary) hypertension: Secondary | ICD-10-CM

## 2017-02-19 DIAGNOSIS — I480 Paroxysmal atrial fibrillation: Secondary | ICD-10-CM

## 2017-02-19 DIAGNOSIS — Z7901 Long term (current) use of anticoagulants: Secondary | ICD-10-CM | POA: Diagnosis not present

## 2017-02-19 NOTE — Patient Instructions (Addendum)
-   continue Xarelto and crestor for stroke prevention - follow up with cardiology Dr. Tamala Julian for afib  - Follow up with your primary care physician for stroke risk factor modification. Recommend maintain blood pressure goal <130/80, diabetes with hemoglobin A1c goal below 6.5% and lipids with LDL cholesterol goal below 70 mg/dL.  - healthy diabetic diet and regular exercise - check BP and glucose at home and record - follow up as needed.
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
## 2017-03-12 DIAGNOSIS — D6489 Other specified anemias: Secondary | ICD-10-CM | POA: Diagnosis not present

## 2017-03-12 DIAGNOSIS — R12 Heartburn: Secondary | ICD-10-CM | POA: Diagnosis not present

## 2017-03-12 DIAGNOSIS — R0609 Other forms of dyspnea: Secondary | ICD-10-CM | POA: Diagnosis not present

## 2017-03-12 DIAGNOSIS — R748 Abnormal levels of other serum enzymes: Secondary | ICD-10-CM | POA: Diagnosis not present

## 2017-03-12 DIAGNOSIS — R400 Somnolence: Secondary | ICD-10-CM | POA: Diagnosis not present

## 2017-03-12 DIAGNOSIS — R5383 Other fatigue: Secondary | ICD-10-CM | POA: Diagnosis not present

## 2017-03-18 DIAGNOSIS — D225 Melanocytic nevi of trunk: Secondary | ICD-10-CM | POA: Diagnosis not present

## 2017-03-18 DIAGNOSIS — L814 Other melanin hyperpigmentation: Secondary | ICD-10-CM | POA: Diagnosis not present

## 2017-03-18 DIAGNOSIS — Z85828 Personal history of other malignant neoplasm of skin: Secondary | ICD-10-CM | POA: Diagnosis not present

## 2017-03-18 DIAGNOSIS — L821 Other seborrheic keratosis: Secondary | ICD-10-CM | POA: Diagnosis not present

## 2017-04-22 DIAGNOSIS — G4733 Obstructive sleep apnea (adult) (pediatric): Secondary | ICD-10-CM | POA: Diagnosis not present

## 2017-04-25 DIAGNOSIS — G4733 Obstructive sleep apnea (adult) (pediatric): Secondary | ICD-10-CM | POA: Diagnosis not present

## 2017-04-25 DIAGNOSIS — D509 Iron deficiency anemia, unspecified: Secondary | ICD-10-CM | POA: Diagnosis not present

## 2017-04-26 DIAGNOSIS — Z961 Presence of intraocular lens: Secondary | ICD-10-CM | POA: Diagnosis not present

## 2017-04-26 DIAGNOSIS — H52203 Unspecified astigmatism, bilateral: Secondary | ICD-10-CM | POA: Diagnosis not present

## 2017-04-26 DIAGNOSIS — H43813 Vitreous degeneration, bilateral: Secondary | ICD-10-CM | POA: Diagnosis not present

## 2017-04-26 DIAGNOSIS — E119 Type 2 diabetes mellitus without complications: Secondary | ICD-10-CM | POA: Diagnosis not present

## 2017-05-02 DIAGNOSIS — E1122 Type 2 diabetes mellitus with diabetic chronic kidney disease: Secondary | ICD-10-CM | POA: Diagnosis not present

## 2017-05-02 DIAGNOSIS — Z794 Long term (current) use of insulin: Secondary | ICD-10-CM | POA: Diagnosis not present

## 2017-05-02 DIAGNOSIS — Z5181 Encounter for therapeutic drug level monitoring: Secondary | ICD-10-CM | POA: Diagnosis not present

## 2017-05-02 DIAGNOSIS — N182 Chronic kidney disease, stage 2 (mild): Secondary | ICD-10-CM | POA: Diagnosis not present

## 2017-05-08 ENCOUNTER — Other Ambulatory Visit: Payer: Self-pay | Admitting: Interventional Cardiology

## 2017-05-13 DIAGNOSIS — D509 Iron deficiency anemia, unspecified: Secondary | ICD-10-CM | POA: Diagnosis not present

## 2017-05-15 DIAGNOSIS — D509 Iron deficiency anemia, unspecified: Secondary | ICD-10-CM | POA: Diagnosis not present

## 2017-05-31 ENCOUNTER — Encounter: Payer: Self-pay | Admitting: Interventional Cardiology

## 2017-05-31 ENCOUNTER — Other Ambulatory Visit: Payer: Self-pay | Admitting: Interventional Cardiology

## 2017-06-12 ENCOUNTER — Other Ambulatory Visit: Payer: Self-pay | Admitting: Interventional Cardiology

## 2017-06-12 NOTE — Telephone Encounter (Signed)
Pt last saw Dr Tamala Julian 01/01/17, last labs 07/19/16 Creat 1.34, Needs f/u labwork pt has appt on 06/20/17 with Dr Tamala Julian note placed on appt to repeat CBC and BMP at [place].  Age 71, weight 91.9kg, CrCl 65.72, based on CrCl pt is on appropriate dosage of Xarelto 20mg  QD.  Will refill rx.

## 2017-06-19 NOTE — Progress Notes (Signed)
Cardiology Office Note    Date:  06/20/2017   ID:  Walter [name], Walter [name] 1945/12/22, MRN [id number]  PCP:  Lavone Orn, MD  Cardiologist: Sinclair Grooms, MD   Chief Complaint  Patient presents with  . Coronary Artery Disease  . Congestive Heart Failure  . Atrial Fibrillation    History of Present Illness:  Walter [name] is a 71 y.o. male who presents for paroxysmal atrial fibrillation, CAD with prior CABG, hypertension, CKD 2, chronic anticoagulation therapy, prior embolic CVA, and amiodarone therapy.  He is ambulatory without angina pectoris. He has been very fatigued. He has not been exercising. He has not needed to use nitroglycerin.  He is not limited by dyspnea. There is no orthopnea. He denies lower extremity edema.  He admits to no new neurological complaints. There been no episodes of vision disturbance, focal deficits, or headache.  No bleeding has been noted however Dr. Laurann Montana recently identified anemia with hemoglobin of 10.5. On Xarelto for embolic stroke protection. He has had colon resection for polyps. He is currently on iron.  He denies palpitations and episodes of atrial fibrillation. Recently diagnosed with sleep apnea. Trying to find before meals pap mask that he can tolerate.   Past Medical History:  Diagnosis Date  . Abdominal pain   . Cancer [facility])    prostate  . Chronic kidney disease   . Colon polyp   . Diabetes mellitus   . Gum disease   . Pleural effusion   . Stroke [facility])     Past Surgical History:  Procedure Laterality Date  . CATARACT EXTRACTION  2006 and 2007   bilateral  . COLON SURGERY    . COLONOSCOPY    . CORONARY ARTERY BYPASS GRAFT      Current Medications: Outpatient Medications Prior to Visit  Medication Sig Dispense Refill  . amiodarone (PACERONE) 200 MG tablet Take 1 tablet (200 mg total) by mouth daily. 90 tablet 2  . buPROPion (WELLBUTRIN XL) 150 MG 24 hr tablet Take 150 mg by mouth daily.  0  . escitalopram (LEXAPRO)  20 MG tablet Take 20 mg by mouth daily.      Marland Kitchen JANUMET XR 50-1000 MG TB24 Take 1 tablet by mouth 2 (two) times daily.     Marland Kitchen lisinopril (PRINIVIL,ZESTRIL) 20 MG tablet Take 1 tablet (20 mg total) by mouth daily. 90 tablet 1  . metoprolol succinate (TOPROL-XL) 50 MG 24 hr tablet TAKE ONE AND ONE-HALF TABLETS DAILY WITH OR IMMEDIATELY FOLLOWING A MEAL 135 tablet 2  . Omega-3-acid Ethyl Esters (LOVAZA PO) Take 1 tablet by mouth 2 (two) times daily.     Glory Rosebush VERIO test strip     . rosuvastatin (CRESTOR) 10 MG tablet Take 10 mg by mouth at bedtime.     . TOVIAZ 8 MG TB24 tablet Take 8 mg by mouth daily.    . TRESIBA FLEXTOUCH 200 UNIT/ML SOPN Inject 80 Units into the skin daily.    Marland Kitchen VIAGRA 100 MG tablet Take 1 tablet by mouth daily as needed for erectile dysfunction. ED    . XARELTO 20 MG TABS tablet TAKE 1 TABLET DAILY 90 tablet 1   No facility-administered medications prior to visit.      Allergies:   Patient has no known allergies.   Social History   Social History  . Marital status: Married    Spouse name: N/A  . Number of children: N/A  . Years of education: N/A   Social  History Main Topics  . Smoking status: Former Research scientist (life sciences)  . Smokeless tobacco: Never Used  . Alcohol use 1.8 oz/week    1 Glasses of wine, 1 Cans of beer, 1 Shots of liquor per week     Comment: social  . Drug use: Yes    Types: Marijuana     Comment: frequently  . Sexual activity: Not Asked   Other Topics Concern  . None   Social History Narrative  . None     Family History:  The patient's family history includes Angina in his mother; Coronary artery disease in his father; Diabetes in his father; Stroke in his father.   ROS:   Please see the history of present illness.    Fatigue, snoring, weakness. Difficulty sleeping. Recent diagnosis of obstructive sleep apnea. Wearing various devices to get control of the sleep apnea. Occasional vision disturbance. Excessive fatigue. Difficulty urinating.  Constipation. Some difficulty with balance, and complains of easy bruising. Recent chills and back pain. No dysuria.  All other systems reviewed and are negative.   PHYSICAL EXAM:   VS:  BP 126/70 (BP Location: Right Arm)   Pulse 67   Ht 5\' 8"  (1.727 m)   Wt 202 lb 1.9 oz (91.7 kg)   BMI 30.73 kg/m    GEN: Well nourished, well developed, in no acute distress  HEENT: normal  Neck: no JVD, carotid bruits, or masses Cardiac: RRR; no murmurs, rubs, or gallops,no edema  Respiratory:  clear to auscultation bilaterally, normal work of breathing GI: soft, nontender, nondistended, + BS MS: no deformity or atrophy  Skin: warm and dry, no rash Neuro:  Alert and Oriented x 3, Strength and sensation are intact Psych: Aggressive/angry mood related to sleep apnea and inability to find a device that is comfortable. Did not sleep well last night.  Wt Readings from Last 3 Encounters:  06/20/17 202 lb 1.9 oz (91.7 kg)  02/19/17 202 lb 9.6 oz (91.9 kg)  01/01/17 205 lb (93 kg)      Studies/Labs Reviewed:   EKG:  EKG  none  Recent Labs: 07/19/2016: BUN 22; Creat 1.34; Potassium 4.7; Sodium 139 01/01/2017: ALT 45; TSH 3.010   Lipid Panel    Component Value Date/Time   CHOL 121 12/31/2014 0555   TRIG 208 (H) 12/31/2014 0555   HDL 35 (L) 12/31/2014 0555   CHOLHDL 3.5 12/31/2014 0555   VLDL 42 (H) 12/31/2014 0555   LDLCALC 44 12/31/2014 0555    Additional studies/ records that were reviewed today include:  Chest x-ray 01/02/2017: IMPRESSION: Stable slight scarring left base. No edema or consolidation. Stable cardiac silhouette. There is aortic atherosclerosis.   ASSESSMENT:    1. 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 Paroxysmal atrial fibrillation ([place])   3. Essential hypertension   4. Chronic systolic heart failure ([place])   5. Obstructive sleep apnea   6. Hemispheric carotid artery syndrome   7. Type 2 diabetes  mellitus with other circulatory complication, without long-term current use of insulin (HCC)      PLAN:  In order of problems listed above:  1. No exertion related chest discomfort/angina. Notify us if angina or nitroglycerin use is required. 2. Clinically, he has not had atrial fibrillation that produces symptoms as in the past. He is in sinus rhythm based upon clinical exam today. 3. His blood pressure is at target. We will like to keep his blood pressure 130/85 or less.  4. No volume overload on clinical exam. Guideline directed therapy to prevent worsening LV function includes metoprolol succinate, ACE inhibitor therapy, and excellent blood pressure control. 5. We must continue anticoagulation therapy because of prior stroke. Continue iron. Likely to have hemoglobin repeat today by Dr. Laurann Montana. 6. This is a new diagnosis and is critical given his cardiovascular problems that. I encouraged him to try being as compliant as possible to help prevent recurrent atrial fibrillation and progression of vascular disease. 7. Not addressed 8. Not addressed  He believes to be have blood work drawn by Dr. Laurann Montana today. We will ask them to draw a TSH and hepatic panel. I will see him back in 6 months. Chest x-ray, TSH, and hepatic panel at that time.  Medication Adjustments/Labs and Tests Ordered: Current medicines are reviewed at length with the patient today.  Concerns regarding medicines are outlined above.  Medication changes, Labs and Tests ordered today are listed in the Patient Instructions below. Patient Instructions  Medication Instructions:  None  Labwork: TSH and liver panel.  Please take prescription to your Primary Care appointment and have them fax results to [phone].  Testing/Procedures: None  Follow-Up: Your physician wants you to follow-up in: 6 months with Dr. Tamala Julian.  You will receive a reminder letter in the mail two months in advance. If you don't receive a letter, please  call our office to schedule the follow-up appointment.   Any Other Special Instructions Will Be Listed Below (If Applicable).     If you need a refill on your cardiac medications before your next appointment, please call your pharmacy.      Signed, Sinclair Grooms, MD  06/20/2017 9:22 AM    [facility] [place], [place], [place]  [address] Phone: [phone]; Fax: [phone]

## 2017-06-20 ENCOUNTER — Encounter: Payer: Self-pay | Admitting: Interventional Cardiology

## 2017-06-20 ENCOUNTER — Ambulatory Visit (INDEPENDENT_AMBULATORY_CARE_PROVIDER_SITE_OTHER): Payer: Medicare Other | Admitting: Interventional Cardiology

## 2017-06-20 VITALS — BP 126/70 | HR 67 | Ht 68.0 in | Wt 202.1 lb

## 2017-06-20 DIAGNOSIS — I480 Paroxysmal atrial fibrillation: Secondary | ICD-10-CM

## 2017-06-20 DIAGNOSIS — E1159 Type 2 diabetes mellitus with other circulatory complications: Secondary | ICD-10-CM | POA: Diagnosis not present

## 2017-06-20 DIAGNOSIS — I10 Essential (primary) hypertension: Secondary | ICD-10-CM | POA: Diagnosis not present

## 2017-06-20 DIAGNOSIS — I209 Angina pectoris, unspecified: Secondary | ICD-10-CM | POA: Diagnosis not present

## 2017-06-20 DIAGNOSIS — D509 Iron deficiency anemia, unspecified: Secondary | ICD-10-CM | POA: Diagnosis not present

## 2017-06-20 DIAGNOSIS — D500 Iron deficiency anemia secondary to blood loss (chronic): Secondary | ICD-10-CM

## 2017-06-20 DIAGNOSIS — I5022 Chronic systolic (congestive) heart failure: Secondary | ICD-10-CM

## 2017-06-20 DIAGNOSIS — Z79899 Other long term (current) drug therapy: Secondary | ICD-10-CM | POA: Diagnosis not present

## 2017-06-20 DIAGNOSIS — I25709 Atherosclerosis of coronary artery bypass graft(s), unspecified, with unspecified angina pectoris: Secondary | ICD-10-CM | POA: Diagnosis not present

## 2017-06-20 DIAGNOSIS — G451 Carotid artery syndrome (hemispheric): Secondary | ICD-10-CM | POA: Diagnosis not present

## 2017-06-20 DIAGNOSIS — G4733 Obstructive sleep apnea (adult) (pediatric): Secondary | ICD-10-CM

## 2017-06-20 NOTE — Patient Instructions (Signed)
Medication Instructions:  None  Labwork: TSH and liver panel.  Please take prescription to your Primary Care appointment and have them fax results to [phone].  Testing/Procedures: None  Follow-Up: Your physician wants you to follow-up in: 6 months with Dr. Tamala Julian.  You will receive a reminder letter in the mail two months in advance. If you don't receive a letter, please call our office to schedule the follow-up appointment.   Any Other Special Instructions Will Be Listed Below (If Applicable).     If you need a refill on your cardiac medications before your next appointment, please call your pharmacy.

## 2017-06-27 DIAGNOSIS — G4733 Obstructive sleep apnea (adult) (pediatric): Secondary | ICD-10-CM | POA: Diagnosis not present

## 2017-07-16 ENCOUNTER — Other Ambulatory Visit: Payer: Self-pay | Admitting: Interventional Cardiology

## 2017-07-16 DIAGNOSIS — I4891 Unspecified atrial fibrillation: Secondary | ICD-10-CM

## 2017-07-24 DIAGNOSIS — Z23 Encounter for immunization: Secondary | ICD-10-CM | POA: Diagnosis not present

## 2017-08-05 DIAGNOSIS — F325 Major depressive disorder, single episode, in full remission: Secondary | ICD-10-CM | POA: Diagnosis not present

## 2017-08-05 DIAGNOSIS — E1121 Type 2 diabetes mellitus with diabetic nephropathy: Secondary | ICD-10-CM | POA: Diagnosis not present

## 2017-08-05 DIAGNOSIS — N182 Chronic kidney disease, stage 2 (mild): Secondary | ICD-10-CM | POA: Diagnosis not present

## 2017-08-05 DIAGNOSIS — E113299 Type 2 diabetes mellitus with mild nonproliferative diabetic retinopathy without macular edema, unspecified eye: Secondary | ICD-10-CM | POA: Diagnosis not present

## 2017-08-05 DIAGNOSIS — E782 Mixed hyperlipidemia: Secondary | ICD-10-CM | POA: Diagnosis not present

## 2017-08-05 DIAGNOSIS — I2510 Atherosclerotic heart disease of native coronary artery without angina pectoris: Secondary | ICD-10-CM | POA: Diagnosis not present

## 2017-08-05 DIAGNOSIS — D509 Iron deficiency anemia, unspecified: Secondary | ICD-10-CM | POA: Diagnosis not present

## 2017-08-05 DIAGNOSIS — C61 Malignant neoplasm of prostate: Secondary | ICD-10-CM | POA: Diagnosis not present

## 2017-08-05 DIAGNOSIS — I10 Essential (primary) hypertension: Secondary | ICD-10-CM | POA: Diagnosis not present

## 2017-08-05 DIAGNOSIS — I480 Paroxysmal atrial fibrillation: Secondary | ICD-10-CM | POA: Diagnosis not present

## 2017-08-05 DIAGNOSIS — N400 Enlarged prostate without lower urinary tract symptoms: Secondary | ICD-10-CM | POA: Diagnosis not present

## 2017-10-02 DIAGNOSIS — E669 Obesity, unspecified: Secondary | ICD-10-CM | POA: Diagnosis not present

## 2017-10-02 DIAGNOSIS — F329 Major depressive disorder, single episode, unspecified: Secondary | ICD-10-CM | POA: Diagnosis not present

## 2017-10-02 DIAGNOSIS — E782 Mixed hyperlipidemia: Secondary | ICD-10-CM | POA: Diagnosis not present

## 2017-10-02 DIAGNOSIS — Z1389 Encounter for screening for other disorder: Secondary | ICD-10-CM | POA: Diagnosis not present

## 2017-10-02 DIAGNOSIS — G4733 Obstructive sleep apnea (adult) (pediatric): Secondary | ICD-10-CM | POA: Diagnosis not present

## 2017-10-02 DIAGNOSIS — E1121 Type 2 diabetes mellitus with diabetic nephropathy: Secondary | ICD-10-CM | POA: Diagnosis not present

## 2017-10-02 DIAGNOSIS — D509 Iron deficiency anemia, unspecified: Secondary | ICD-10-CM | POA: Diagnosis not present

## 2017-10-02 DIAGNOSIS — I10 Essential (primary) hypertension: Secondary | ICD-10-CM | POA: Diagnosis not present

## 2017-10-02 DIAGNOSIS — F325 Major depressive disorder, single episode, in full remission: Secondary | ICD-10-CM | POA: Diagnosis not present

## 2017-10-02 DIAGNOSIS — Z0000 Encounter for general adult medical examination without abnormal findings: Secondary | ICD-10-CM | POA: Diagnosis not present

## 2017-10-31 DIAGNOSIS — G4733 Obstructive sleep apnea (adult) (pediatric): Secondary | ICD-10-CM | POA: Diagnosis not present

## 2017-11-05 DIAGNOSIS — E1122 Type 2 diabetes mellitus with diabetic chronic kidney disease: Secondary | ICD-10-CM | POA: Diagnosis not present

## 2017-11-05 DIAGNOSIS — N182 Chronic kidney disease, stage 2 (mild): Secondary | ICD-10-CM | POA: Diagnosis not present

## 2017-11-05 DIAGNOSIS — Z5181 Encounter for therapeutic drug level monitoring: Secondary | ICD-10-CM | POA: Diagnosis not present

## 2017-11-05 DIAGNOSIS — Z794 Long term (current) use of insulin: Secondary | ICD-10-CM | POA: Diagnosis not present

## 2017-11-27 ENCOUNTER — Other Ambulatory Visit: Payer: Self-pay | Admitting: Interventional Cardiology

## 2017-11-27 DIAGNOSIS — G4733 Obstructive sleep apnea (adult) (pediatric): Secondary | ICD-10-CM | POA: Diagnosis not present

## 2017-12-01 DIAGNOSIS — G4733 Obstructive sleep apnea (adult) (pediatric): Secondary | ICD-10-CM | POA: Diagnosis not present

## 2017-12-05 DIAGNOSIS — R69 Illness, unspecified: Secondary | ICD-10-CM | POA: Diagnosis not present

## 2017-12-05 DIAGNOSIS — I2510 Atherosclerotic heart disease of native coronary artery without angina pectoris: Secondary | ICD-10-CM | POA: Diagnosis not present

## 2017-12-05 DIAGNOSIS — N182 Chronic kidney disease, stage 2 (mild): Secondary | ICD-10-CM | POA: Diagnosis not present

## 2017-12-05 DIAGNOSIS — I480 Paroxysmal atrial fibrillation: Secondary | ICD-10-CM | POA: Diagnosis not present

## 2017-12-05 DIAGNOSIS — I10 Essential (primary) hypertension: Secondary | ICD-10-CM | POA: Diagnosis not present

## 2017-12-05 DIAGNOSIS — N400 Enlarged prostate without lower urinary tract symptoms: Secondary | ICD-10-CM | POA: Diagnosis not present

## 2017-12-05 DIAGNOSIS — D509 Iron deficiency anemia, unspecified: Secondary | ICD-10-CM | POA: Diagnosis not present

## 2017-12-05 DIAGNOSIS — E1121 Type 2 diabetes mellitus with diabetic nephropathy: Secondary | ICD-10-CM | POA: Diagnosis not present

## 2017-12-05 DIAGNOSIS — E782 Mixed hyperlipidemia: Secondary | ICD-10-CM | POA: Diagnosis not present

## 2017-12-05 DIAGNOSIS — C61 Malignant neoplasm of prostate: Secondary | ICD-10-CM | POA: Diagnosis not present

## 2017-12-09 ENCOUNTER — Other Ambulatory Visit: Payer: Self-pay | Admitting: Interventional Cardiology

## 2017-12-29 DIAGNOSIS — G4733 Obstructive sleep apnea (adult) (pediatric): Secondary | ICD-10-CM | POA: Diagnosis not present

## 2018-01-29 DIAGNOSIS — G4733 Obstructive sleep apnea (adult) (pediatric): Secondary | ICD-10-CM | POA: Diagnosis not present

## 2018-02-03 ENCOUNTER — Other Ambulatory Visit: Payer: Self-pay | Admitting: Interventional Cardiology

## 2018-02-13 ENCOUNTER — Encounter: Payer: Self-pay | Admitting: Interventional Cardiology

## 2018-02-13 ENCOUNTER — Ambulatory Visit (INDEPENDENT_AMBULATORY_CARE_PROVIDER_SITE_OTHER): Payer: Medicare HMO | Admitting: Interventional Cardiology

## 2018-02-13 ENCOUNTER — Encounter

## 2018-02-13 VITALS — BP 110/66 | HR 65 | Ht 67.0 in | Wt 213.8 lb

## 2018-02-13 DIAGNOSIS — E1159 Type 2 diabetes mellitus with other circulatory complications: Secondary | ICD-10-CM | POA: Diagnosis not present

## 2018-02-13 DIAGNOSIS — Z79899 Other long term (current) drug therapy: Secondary | ICD-10-CM

## 2018-02-13 DIAGNOSIS — I25709 Atherosclerosis of coronary artery bypass graft(s), unspecified, with unspecified angina pectoris: Secondary | ICD-10-CM | POA: Diagnosis not present

## 2018-02-13 DIAGNOSIS — I63133 Cerebral infarction due to embolism of bilateral carotid arteries: Secondary | ICD-10-CM | POA: Diagnosis not present

## 2018-02-13 DIAGNOSIS — I480 Paroxysmal atrial fibrillation: Secondary | ICD-10-CM

## 2018-02-13 DIAGNOSIS — I5022 Chronic systolic (congestive) heart failure: Secondary | ICD-10-CM

## 2018-02-13 DIAGNOSIS — G4733 Obstructive sleep apnea (adult) (pediatric): Secondary | ICD-10-CM

## 2018-02-13 DIAGNOSIS — E785 Hyperlipidemia, unspecified: Secondary | ICD-10-CM | POA: Diagnosis not present

## 2018-02-13 LAB — HEPATIC FUNCTION PANEL
ALBUMIN: 3.6 g/dL (ref 3.5–4.8)
ALK PHOS: 44 IU/L (ref 39–117)
ALT: 51 IU/L — ABNORMAL HIGH (ref 0–44)
AST: 52 IU/L — AB (ref 0–40)
BILIRUBIN TOTAL: 0.4 mg/dL (ref 0.0–1.2)
Bilirubin, Direct: 0.14 mg/dL (ref 0.00–0.40)
Total Protein: 6.1 g/dL (ref 6.0–8.5)

## 2018-02-13 LAB — BASIC METABOLIC PANEL
BUN/Creatinine Ratio: 14 (ref 10–24)
BUN: 19 mg/dL (ref 8–27)
CALCIUM: 9.8 mg/dL (ref 8.6–10.2)
CO2: 25 mmol/L (ref 20–29)
CREATININE: 1.32 mg/dL — AB (ref 0.76–1.27)
Chloride: 99 mmol/L (ref 96–106)
GFR calc non Af Amer: 54 mL/min/{1.73_m2} — ABNORMAL LOW (ref >59)
GFR, EST AFRICAN AMERICAN: 62 mL/min/{1.73_m2} (ref >59)
Glucose: 194 mg/dL — ABNORMAL HIGH (ref 65–99)
Potassium: 4.7 mmol/L (ref 3.5–5.2)
SODIUM: 137 mmol/L (ref 134–144)

## 2018-02-13 LAB — TSH: TSH: 6.01 u[IU]/mL — ABNORMAL HIGH (ref 0.450–4.500)

## 2018-02-13 NOTE — Progress Notes (Signed)
Cardiology Office Note    Date:  02/13/2018   ID:  Colter, [place] Dec 18, 1945, MRN [id number]  PCP:  Lavone Orn, MD  Cardiologist: Sinclair Grooms, MD   Chief Complaint  Patient presents with  . Coronary Artery Disease  . Atrial Fibrillation  . Congestive Heart Failure    History of Present Illness:  Walter [name] is a 72 y.o. male who presents for paroxysmal atrial fibrillation, CAD with prior CABG, hypertension, CKD 2, chronic anticoagulation therapy, prior embolic CVA, and amiodarone therapy.  No cardiac complaints today.  No episodes of atrial fibrillation or specific medication side effects have been detected.  Still has some residual physical dysfunction related to his embolic stroke.  He has not needed to use nitroglycerin.  No overt bleeding on anticoagulation therapy.   Past Medical History:  Diagnosis Date  . Abdominal pain   . Cancer [facility])    prostate  . Chronic kidney disease   . Colon polyp   . Diabetes mellitus   . Gum disease   . Pleural effusion   . Stroke [facility])     Past Surgical History:  Procedure Laterality Date  . CATARACT EXTRACTION  2006 and 2007   bilateral  . COLON SURGERY    . COLONOSCOPY    . CORONARY ARTERY BYPASS GRAFT      Current Medications: Outpatient Medications Prior to Visit  Medication Sig Dispense Refill  . amiodarone (PACERONE) 200 MG tablet Take 1 tablet (200 mg total) by mouth daily. 90 tablet 2  . buPROPion (WELLBUTRIN XL) 150 MG 24 hr tablet Take 150 mg by mouth daily.  0  . escitalopram (LEXAPRO) 20 MG tablet Take 20 mg by mouth daily.      Marland Kitchen JANUMET XR 50-1000 MG TB24 Take 1 tablet by mouth 2 (two) times daily.     Marland Kitchen lisinopril (PRINIVIL,ZESTRIL) 20 MG tablet TAKE 1 TABLET DAILY 90 tablet 1  . metoprolol succinate (TOPROL-XL) 50 MG 24 hr tablet TAKE ONE AND ONE-HALF TABLETS DAILY WITH OR IMMEDIATELY FOLLOWING A MEAL 135 tablet 2  . Omega-3-acid Ethyl Esters (LOVAZA PO) Take 1 tablet by mouth 2 (two) times daily.       . rivaroxaban (XARELTO) 20 MG TABS tablet Take 20 mg by mouth daily with supper.    . rosuvastatin (CRESTOR) 10 MG tablet Take 10 mg by mouth at bedtime.     . TOVIAZ 8 MG TB24 tablet Take 8 mg by mouth daily.    . TRESIBA FLEXTOUCH 200 UNIT/ML SOPN Inject 80 Units into the skin daily.    Glory Rosebush VERIO test strip     . VIAGRA 100 MG tablet Take 1 tablet by mouth daily as needed for erectile dysfunction. ED    . XARELTO 20 MG TABS tablet TAKE 1 TABLET DAILY (Patient not taking: Reported on 02/13/2018) 90 tablet 1   No facility-administered medications prior to visit.      Allergies:   Patient has no known allergies.   Social History   Socioeconomic History  . Marital status: Married    Spouse name: Not on file  . Number of children: Not on file  . Years of education: Not on file  . Highest education level: Not on file  Occupational History  . Not on file  Social Needs  . Financial resource strain: Not on file  . Food insecurity:    Worry: Not on file    Inability: Not on file  . Transportation needs:  Medical: Not on file    Non-medical: Not on file  Tobacco Use  . Smoking status: Former Research scientist (life sciences)  . Smokeless tobacco: Never Used  Substance and Sexual Activity  . Alcohol use: Yes    Alcohol/week: 1.8 oz    Types: 1 Glasses of wine, 1 Cans of beer, 1 Shots of liquor per week    Comment: social  . Drug use: Yes    Types: Marijuana    Comment: frequently  . Sexual activity: Not on file  Lifestyle  . Physical activity:    Days per week: Not on file    Minutes per session: Not on file  . Stress: Not on file  Relationships  . Social connections:    Talks on phone: Not on file    Gets together: Not on file    Attends religious service: Not on file    Active member of club or organization: Not on file    Attends meetings of clubs or organizations: Not on file    Relationship status: Not on file  Other Topics Concern  . Not on file  Social History Narrative  . Not  on file     Family History:  The patient's family history includes Angina in his mother; Coronary artery disease in his father; Diabetes in his father; Stroke in his father.   ROS:   Please see the history of present illness.    Unexplained weight gain, change in appetite, cough, shortness of breath, constipation, difficulty with balance, depression, bleeding, and easy bruising. All other systems reviewed and are negative.   PHYSICAL EXAM:   VS:  BP 110/66   Pulse 65   Ht 5\' 7"  (1.702 m)   Wt 213 lb 12.8 oz (97 kg)   BMI 33.49 kg/m    GEN: Well nourished, well developed, in no acute distress  HEENT: normal  Neck: no JVD, carotid bruits, or masses Cardiac: RRR; no murmurs, rubs, or gallops,no edema  Respiratory:  clear to auscultation bilaterally, normal work of breathing GI: soft, nontender, nondistended, + BS MS: no deformity or atrophy  Skin: warm and dry, no rash Neuro:  Alert and Oriented x 3, Strength and sensation are intact Psych: euthymic mood, full affect  Wt Readings from Last 3 Encounters:  02/13/18 213 lb 12.8 oz (97 kg)  06/20/17 202 lb 1.9 oz (91.7 kg)  02/19/17 202 lb 9.6 oz (91.9 kg)      Studies/Labs Reviewed:   EKG:  EKG normal sinus rhythm, leftward axis, otherwise unremarkable.  Recent Labs: No results found for requested labs within last 8760 hours.   Lipid Panel    Component Value Date/Time   CHOL 121 12/31/2014 0555   TRIG 208 (H) 12/31/2014 0555   HDL 35 (L) 12/31/2014 0555   CHOLHDL 3.5 12/31/2014 0555   VLDL 42 (H) 12/31/2014 0555   LDLCALC 44 12/31/2014 0555    Additional studies/ records that were reviewed today include:  Last liver and hepatic panel were performed greater than 6 months ago.  PA and lateral chest x-ray March 2018:  IMPRESSION: Stable slight scarring left base. No edema or consolidation. Stable cardiac silhouette. There is aortic atherosclerosis.  2D Doppler echocardiogram August 2016:  IMPRESSION: Stable  slight scarring left base. No edema or consolidation. Stable cardiac silhouette. There is aortic atherosclerosis.  ASSESSMENT:    1. Paroxysmal atrial fibrillation (HCC)   2. On amiodarone therapy   3. 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 Type 2 diabetes mellitus with other circulatory complication, without long-term current use of insulin ([place])   5. Obstructive sleep apnea   6. Hyperlipidemia, unspecified hyperlipidemia type   7. Coronary artery disease involving coronary bypass graft of native heart with angina pectoris ([place])   8. Chronic systolic heart failure (HCC)      PLAN:  In order of problems listed above:  1. Rhythm has been controlled with amiodarone. 2. No obvious clinical evidence of toxicity.  Will need PA and lateral chest x-ray before return 3. No new symptoms 4. Being followed by primary care with most recent A1c 6.9 5. Did not discuss compliance with CPAP. 6. LDL target less than 70 with most recent in December 2018 of 41. 7. Stable without angina.  Encouraged aerobic activity. 8. No evidence of volume overload  Clinical follow-up in 6 months.  TSH and hepatic panel at that time.  Get chest x-ray soon.  Call if angina or bleeding.  Medication Adjustments/Labs and Tests Ordered: Current medicines are reviewed at length with the patient today.  Concerns regarding medicines are outlined above.  Medication changes, Labs and Tests ordered today are listed in the Patient Instructions below. Patient Instructions  Medication Instructions:  Your physician recommends that you continue on your current medications as directed. Please refer to the Current Medication list given to you today.  Labwork: TSH, BMET and Liver today  Testing/Procedures: None  Follow-Up: Your physician wants you to follow-up in: 6 months with Dr. Tamala Julian.  You will receive a reminder letter in the mail two months in advance. If you don't receive  a letter, please call our office to schedule the follow-up appointment.   Any Other Special Instructions Will Be Listed Below (If Applicable).     If you need a refill on your cardiac medications before your next appointment, please call your pharmacy.      Signed, Sinclair Grooms, MD  02/13/2018 10:55 AM    [facility] [place], [place], [place]  [address] Phone: [phone]; Fax: [phone]

## 2018-02-13 NOTE — Patient Instructions (Signed)
Medication Instructions:  Your physician recommends that you continue on your current medications as directed. Please refer to the Current Medication list given to you today.  Labwork: TSH, BMET and Liver today  Testing/Procedures: None  Follow-Up: Your physician wants you to follow-up in: 6 months with Dr. Tamala Julian.  You will receive a reminder letter in the mail two months in advance. If you don't receive a letter, please call our office to schedule the follow-up appointment.   Any Other Special Instructions Will Be Listed Below (If Applicable).     If you need a refill on your cardiac medications before your next appointment, please call your pharmacy.

## 2018-02-26 DIAGNOSIS — N3281 Overactive bladder: Secondary | ICD-10-CM | POA: Diagnosis not present

## 2018-02-26 DIAGNOSIS — C61 Malignant neoplasm of prostate: Secondary | ICD-10-CM | POA: Diagnosis not present

## 2018-02-26 DIAGNOSIS — N5201 Erectile dysfunction due to arterial insufficiency: Secondary | ICD-10-CM | POA: Diagnosis not present

## 2018-02-28 DIAGNOSIS — G4733 Obstructive sleep apnea (adult) (pediatric): Secondary | ICD-10-CM | POA: Diagnosis not present

## 2018-03-17 DIAGNOSIS — J069 Acute upper respiratory infection, unspecified: Secondary | ICD-10-CM | POA: Diagnosis not present

## 2018-04-01 DIAGNOSIS — L814 Other melanin hyperpigmentation: Secondary | ICD-10-CM | POA: Diagnosis not present

## 2018-04-01 DIAGNOSIS — L570 Actinic keratosis: Secondary | ICD-10-CM | POA: Diagnosis not present

## 2018-04-01 DIAGNOSIS — D1801 Hemangioma of skin and subcutaneous tissue: Secondary | ICD-10-CM | POA: Diagnosis not present

## 2018-04-01 DIAGNOSIS — Z85828 Personal history of other malignant neoplasm of skin: Secondary | ICD-10-CM | POA: Diagnosis not present

## 2018-04-01 DIAGNOSIS — D229 Melanocytic nevi, unspecified: Secondary | ICD-10-CM | POA: Diagnosis not present

## 2018-04-01 DIAGNOSIS — L821 Other seborrheic keratosis: Secondary | ICD-10-CM | POA: Diagnosis not present

## 2018-04-03 DIAGNOSIS — I10 Essential (primary) hypertension: Secondary | ICD-10-CM | POA: Diagnosis not present

## 2018-04-14 ENCOUNTER — Other Ambulatory Visit: Payer: Self-pay | Admitting: Interventional Cardiology

## 2018-04-14 DIAGNOSIS — I4891 Unspecified atrial fibrillation: Secondary | ICD-10-CM

## 2018-04-15 DIAGNOSIS — E1122 Type 2 diabetes mellitus with diabetic chronic kidney disease: Secondary | ICD-10-CM | POA: Diagnosis not present

## 2018-04-15 DIAGNOSIS — Z79899 Other long term (current) drug therapy: Secondary | ICD-10-CM | POA: Diagnosis not present

## 2018-04-15 DIAGNOSIS — R7989 Other specified abnormal findings of blood chemistry: Secondary | ICD-10-CM | POA: Diagnosis not present

## 2018-04-15 DIAGNOSIS — N183 Chronic kidney disease, stage 3 (moderate): Secondary | ICD-10-CM | POA: Diagnosis not present

## 2018-04-15 DIAGNOSIS — Z794 Long term (current) use of insulin: Secondary | ICD-10-CM | POA: Diagnosis not present

## 2018-04-28 DIAGNOSIS — H52203 Unspecified astigmatism, bilateral: Secondary | ICD-10-CM | POA: Diagnosis not present

## 2018-04-28 DIAGNOSIS — H43813 Vitreous degeneration, bilateral: Secondary | ICD-10-CM | POA: Diagnosis not present

## 2018-04-28 DIAGNOSIS — E119 Type 2 diabetes mellitus without complications: Secondary | ICD-10-CM | POA: Diagnosis not present

## 2018-04-28 DIAGNOSIS — H04123 Dry eye syndrome of bilateral lacrimal glands: Secondary | ICD-10-CM | POA: Diagnosis not present

## 2018-05-10 DIAGNOSIS — E119 Type 2 diabetes mellitus without complications: Secondary | ICD-10-CM | POA: Diagnosis not present

## 2018-05-10 DIAGNOSIS — I2510 Atherosclerotic heart disease of native coronary artery without angina pectoris: Secondary | ICD-10-CM | POA: Diagnosis not present

## 2018-05-10 DIAGNOSIS — E785 Hyperlipidemia, unspecified: Secondary | ICD-10-CM | POA: Diagnosis not present

## 2018-05-10 DIAGNOSIS — Z794 Long term (current) use of insulin: Secondary | ICD-10-CM | POA: Diagnosis not present

## 2018-05-10 DIAGNOSIS — D509 Iron deficiency anemia, unspecified: Secondary | ICD-10-CM | POA: Diagnosis not present

## 2018-05-10 DIAGNOSIS — Z6833 Body mass index (BMI) 33.0-33.9, adult: Secondary | ICD-10-CM | POA: Diagnosis not present

## 2018-05-10 DIAGNOSIS — I4891 Unspecified atrial fibrillation: Secondary | ICD-10-CM | POA: Diagnosis not present

## 2018-05-10 DIAGNOSIS — R69 Illness, unspecified: Secondary | ICD-10-CM | POA: Diagnosis not present

## 2018-05-10 DIAGNOSIS — M545 Low back pain: Secondary | ICD-10-CM | POA: Diagnosis not present

## 2018-05-10 DIAGNOSIS — I119 Hypertensive heart disease without heart failure: Secondary | ICD-10-CM | POA: Diagnosis not present

## 2018-05-26 ENCOUNTER — Other Ambulatory Visit: Payer: Self-pay | Admitting: Interventional Cardiology

## 2018-05-27 DIAGNOSIS — G4733 Obstructive sleep apnea (adult) (pediatric): Secondary | ICD-10-CM | POA: Diagnosis not present

## 2018-06-07 ENCOUNTER — Other Ambulatory Visit: Payer: Self-pay | Admitting: Interventional Cardiology

## 2018-06-10 NOTE — Telephone Encounter (Signed)
Pt last saw Dr Tamala Julian 02/13/18, last labs 02/13/18 Creat 1.32, age 72, weight 69.4kg, CrCl 69.4, based on CrCl pt is on appropriate dosage of Xarelto 20mg  QD.  Will refill rx.

## 2018-07-17 DIAGNOSIS — Z23 Encounter for immunization: Secondary | ICD-10-CM | POA: Diagnosis not present

## 2018-08-14 NOTE — Progress Notes (Signed)
Cardiology Office Note:    Date:  08/15/2018   ID:  Walter [name], DOB [birth year], MRN [id number]  PCP:  Lavone Orn, MD  Cardiologist:  Sinclair Grooms, MD   Referring MD: Lavone Orn, MD   Chief Complaint  Patient presents with  . Coronary Artery Disease  . Atrial Fibrillation  . Advice Only    Amiodarone therapy    History of Present Illness:    Walter [name] is a 72 y.o. male with a hx of paroxysmal atrial fibrillation, CAD with prior CABG, hypertension, CKD 2, chronic anticoagulation therapy,prior embolic CVA,and amiodarone therapy.  Walter [name] is doing well.  He denies angina, palpitations, syncope, and lower extremity swelling.  No medication side effects.  He has had some difficulty with balance since his stroke.  Denies symptoms compatible with claudication.  No bleeding complications.  No new neurological complaints.  Past Medical History:  Diagnosis Date  . Abdominal pain   . Cancer [facility])    prostate  . Chronic kidney disease   . Colon polyp   . Diabetes mellitus   . Gum disease   . Pleural effusion   . Stroke [facility])     Past Surgical History:  Procedure Laterality Date  . CATARACT EXTRACTION  2006 and 2007   bilateral  . COLON SURGERY    . COLONOSCOPY    . CORONARY ARTERY BYPASS GRAFT      Current Medications: Current Meds  Medication Sig  . amiodarone (PACERONE) 200 MG tablet TAKE 1 TABLET DAILY  . buPROPion (WELLBUTRIN XL) 150 MG 24 hr tablet Take 150 mg by mouth daily.  Marland Kitchen escitalopram (LEXAPRO) 20 MG tablet Take 20 mg by mouth daily.    Marland Kitchen JANUMET XR 50-1000 MG TB24 Take 1 tablet by mouth 2 (two) times daily.   Marland Kitchen lisinopril (PRINIVIL,ZESTRIL) 20 MG tablet TAKE 1 TABLET DAILY  . metoprolol succinate (TOPROL-XL) 50 MG 24 hr tablet TAKE ONE AND ONE-HALF TABLETS DAILY WITH OR IMMEDIATELY FOLLOWING A MEAL  . Omega-3-acid Ethyl Esters (LOVAZA PO) Take 1 tablet by mouth 2 (two) times daily.   . rosuvastatin (CRESTOR) 10 MG tablet Take 10 mg by mouth at  bedtime.   . TOVIAZ 8 MG TB24 tablet Take 8 mg by mouth daily.  . TRESIBA FLEXTOUCH 200 UNIT/ML SOPN Inject 80 Units into the skin daily.  Alveda Reasons 20 MG TABS tablet TAKE 1 TABLET DAILY     Allergies:   Patient has no known allergies.   Social History   Socioeconomic History  . Marital status: Married    Spouse name: Not on file  . Number of children: Not on file  . Years of education: Not on file  . Highest education level: Not on file  Occupational History  . Not on file  Social Needs  . Financial resource strain: Not on file  . Food insecurity:    Worry: Not on file    Inability: Not on file  . Transportation needs:    Medical: Not on file    Non-medical: Not on file  Tobacco Use  . Smoking status: Former Research scientist (life sciences)  . Smokeless tobacco: Never Used  Substance and Sexual Activity  . Alcohol use: Yes    Alcohol/week: 3.0 standard drinks    Types: 1 Glasses of wine, 1 Cans of beer, 1 Shots of liquor per week    Comment: social  . Drug use: Yes    Types: Marijuana    Comment: frequently  . Sexual activity:  Not on file  Lifestyle  . Physical activity:    Days per week: Not on file    Minutes per session: Not on file  . Stress: Not on file  Relationships  . Social connections:    Talks on phone: Not on file    Gets together: Not on file    Attends religious service: Not on file    Active member of club or organization: Not on file    Attends meetings of clubs or organizations: Not on file    Relationship status: Not on file  Other Topics Concern  . Not on file  Social History Narrative  . Not on file     Family History: The patient's family history includes Angina in his mother; Coronary artery disease in his father; Diabetes in his father; Stroke in his father.  ROS:   Please see the history of present illness.    Difficulty with balance, excessive fatigue, some shortness of breath.  Since our last office visit he has been diagnosed with obstructive sleep  apnea.  All other systems reviewed and are negative.  EKGs/Labs/Other Studies Reviewed:    The following studies were reviewed today: Last echocardiogram EF: 50% by echo August 2016  EKG:  EKG is not ordered today.    Recent Labs: 02/13/2018: ALT 51; BUN 19; Creatinine, Ser 1.32; Potassium 4.7; Sodium 137; TSH 6.010  Recent Lipid Panel    Component Value Date/Time   CHOL 121 12/31/2014 0555   TRIG 208 (H) 12/31/2014 0555   HDL 35 (L) 12/31/2014 0555   CHOLHDL 3.5 12/31/2014 0555   VLDL 42 (H) 12/31/2014 0555   LDLCALC 44 12/31/2014 0555    Physical Exam:    VS:  BP 124/62   Pulse 63   Ht 5\' 7"  (1.702 m)   Wt 215 lb (97.5 kg)   SpO2 93%   BMI 33.67 kg/m     Wt Readings from Last 3 Encounters:  08/15/18 215 lb (97.5 kg)  02/13/18 213 lb 12.8 oz (97 kg)  06/20/17 202 lb 1.9 oz (91.7 kg)     GEN:  Well nourished, well developed in no acute distress HEENT: Normal NECK: No JVD. LYMPHATICS: No lymphadenopathy CARDIAC: RRR, no murmur, no gallop, no edema. VASCULAR: 2+ bilateral radial and carotid pulses.  No bruits. RESPIRATORY:  Clear to auscultation without rales, wheezing or rhonchi  ABDOMEN: Soft, non-tender, non-distended, No pulsatile mass, MUSCULOSKELETAL: No deformity  SKIN: Warm and dry NEUROLOGIC:  Alert and oriented x 3 PSYCHIATRIC:  Normal affect   ASSESSMENT:    1. 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 Paroxysmal atrial fibrillation ([place])   3. Chronic systolic heart failure ([place])   4. Chronic anticoagulation   5. Essential hypertension   6. Type 2 diabetes mellitus with diabetic peripheral angiopathy without gangrene, without long-term current use of insulin ([place])   7. Mixed hyperlipidemia    PLAN:    In order of problems listed above:  1. Stable without angina.  Discussion concerning secondary risk mitigation. 2. Clinically in normal sinus rhythm today.  We are continuing amiodarone  for rhythm control.  TSH and liver panel will be done when he sees primary physician, Dr. Lavone Orn later this year.  I will see him back in 6 months at which time a TSH and liver panel will be repeated. 3. Systolic heart failure has resolved to normal systolic function with EF of 50%. 4.  No bleeding on anticoagulation therapy.  Patient is compliant with therapy as ordered. 5. Target blood pressure 130/80 mmHg or less. 6. We discussed hemoglobin A1c which a year ago was 7.4 and most recently 6.9 (according to the patient).  He understands that our target should be at least 6.9 or less. 7. LDL target less than 70.  Most recent LDL was 41 and December 2018.  Follow-up for amiodarone monitoring with TSH and hepatic panel in 6 months.  Overall education and awareness concerning primary/secondary risk prevention was discussed in detail: LDL less than 70, hemoglobin A1c less than 7, blood pressure target less than 130/80 mmHg, >150 minutes of moderate aerobic activity per week, avoidance of smoking, weight control (via diet and exercise), and continued surveillance/management of/for obstructive sleep apnea.  Greater than 50% of the time during this office visit was spent in education, counseling, and coordination of care related to underlying disease process and testing as outlined.     Medication Adjustments/Labs and Tests Ordered: Current medicines are reviewed at length with the patient today.  Concerns regarding medicines are outlined above.  No orders of the defined types were placed in this encounter.  No orders of the defined types were placed in this encounter.   There are no Patient Instructions on file for this visit.   Signed, Sinclair Grooms, MD  08/15/2018 10:20 AM    [place]

## 2018-08-15 ENCOUNTER — Ambulatory Visit (INDEPENDENT_AMBULATORY_CARE_PROVIDER_SITE_OTHER): Payer: Medicare HMO | Admitting: Interventional Cardiology

## 2018-08-15 ENCOUNTER — Encounter (INDEPENDENT_AMBULATORY_CARE_PROVIDER_SITE_OTHER): Payer: Self-pay

## 2018-08-15 ENCOUNTER — Encounter: Payer: Self-pay | Admitting: Interventional Cardiology

## 2018-08-15 VITALS — BP 124/62 | HR 63 | Ht 67.0 in | Wt 215.0 lb

## 2018-08-15 DIAGNOSIS — I25709 Atherosclerosis of coronary artery bypass graft(s), unspecified, with unspecified angina pectoris: Secondary | ICD-10-CM

## 2018-08-15 DIAGNOSIS — G4733 Obstructive sleep apnea (adult) (pediatric): Secondary | ICD-10-CM | POA: Diagnosis not present

## 2018-08-15 DIAGNOSIS — Z9989 Dependence on other enabling machines and devices: Secondary | ICD-10-CM | POA: Diagnosis not present

## 2018-08-15 DIAGNOSIS — Z7901 Long term (current) use of anticoagulants: Secondary | ICD-10-CM

## 2018-08-15 DIAGNOSIS — I10 Essential (primary) hypertension: Secondary | ICD-10-CM

## 2018-08-15 DIAGNOSIS — I5022 Chronic systolic (congestive) heart failure: Secondary | ICD-10-CM | POA: Diagnosis not present

## 2018-08-15 DIAGNOSIS — I480 Paroxysmal atrial fibrillation: Secondary | ICD-10-CM | POA: Diagnosis not present

## 2018-08-15 DIAGNOSIS — E782 Mixed hyperlipidemia: Secondary | ICD-10-CM | POA: Diagnosis not present

## 2018-08-15 DIAGNOSIS — E1151 Type 2 diabetes mellitus with diabetic peripheral angiopathy without gangrene: Secondary | ICD-10-CM | POA: Diagnosis not present

## 2018-08-15 NOTE — Patient Instructions (Signed)
Medication Instructions:  Your physician recommends that you continue on your current medications as directed. Please refer to the Current Medication list given to you today.  If you need a refill on your cardiac medications before your next appointment, please call your pharmacy.   Lab work: Liver and TSH when you see Dr. Laurann Montana  If you have labs (blood work) drawn today and your tests are completely normal, you will receive your results only by: Marland Kitchen MyChart Message (if you have MyChart) OR . A paper copy in the mail If you have any lab test that is abnormal or we need to change your treatment, we will call you to review the results.  Testing/Procedures: None  Follow-Up: At [facility], you and your health needs are our priority.  As part of our continuing mission to provide you with exceptional heart care, we have created designated Provider Care Teams.  These Care Teams include your primary Cardiologist (physician) and Advanced Practice Providers (APPs -  Physician Assistants and Nurse Practitioners) who all work together to provide you with the care you need, when you need it. You will need a follow up appointment in 6 months.  Please call our office 2 months in advance to schedule this appointment.  You may see Sinclair Grooms, MD or one of the following Advanced Practice Providers on your designated Care Team:   Truitt Merle, NP Cecilie Kicks, NP . Kathyrn Drown, NP  Any Other Special Instructions Will Be Listed Below (If Applicable).

## 2018-10-03 DIAGNOSIS — N183 Chronic kidney disease, stage 3 (moderate): Secondary | ICD-10-CM | POA: Diagnosis not present

## 2018-10-03 DIAGNOSIS — N3281 Overactive bladder: Secondary | ICD-10-CM | POA: Diagnosis not present

## 2018-10-03 DIAGNOSIS — E1121 Type 2 diabetes mellitus with diabetic nephropathy: Secondary | ICD-10-CM | POA: Diagnosis not present

## 2018-10-03 DIAGNOSIS — I480 Paroxysmal atrial fibrillation: Secondary | ICD-10-CM | POA: Diagnosis not present

## 2018-10-03 DIAGNOSIS — R945 Abnormal results of liver function studies: Secondary | ICD-10-CM | POA: Diagnosis not present

## 2018-10-03 DIAGNOSIS — I2510 Atherosclerotic heart disease of native coronary artery without angina pectoris: Secondary | ICD-10-CM | POA: Diagnosis not present

## 2018-10-03 DIAGNOSIS — Z794 Long term (current) use of insulin: Secondary | ICD-10-CM | POA: Diagnosis not present

## 2018-10-03 DIAGNOSIS — Z8546 Personal history of malignant neoplasm of prostate: Secondary | ICD-10-CM | POA: Diagnosis not present

## 2018-10-03 DIAGNOSIS — I10 Essential (primary) hypertension: Secondary | ICD-10-CM | POA: Diagnosis not present

## 2018-10-03 DIAGNOSIS — D509 Iron deficiency anemia, unspecified: Secondary | ICD-10-CM | POA: Diagnosis not present

## 2018-10-03 DIAGNOSIS — E782 Mixed hyperlipidemia: Secondary | ICD-10-CM | POA: Diagnosis not present

## 2018-10-16 DIAGNOSIS — M2550 Pain in unspecified joint: Secondary | ICD-10-CM | POA: Diagnosis not present

## 2018-10-16 DIAGNOSIS — Z79899 Other long term (current) drug therapy: Secondary | ICD-10-CM | POA: Diagnosis not present

## 2018-10-16 DIAGNOSIS — E1122 Type 2 diabetes mellitus with diabetic chronic kidney disease: Secondary | ICD-10-CM | POA: Diagnosis not present

## 2018-10-16 DIAGNOSIS — Z794 Long term (current) use of insulin: Secondary | ICD-10-CM | POA: Diagnosis not present

## 2018-10-16 DIAGNOSIS — D649 Anemia, unspecified: Secondary | ICD-10-CM | POA: Diagnosis not present

## 2018-10-16 DIAGNOSIS — N183 Chronic kidney disease, stage 3 (moderate): Secondary | ICD-10-CM | POA: Diagnosis not present

## 2018-10-16 DIAGNOSIS — E1143 Type 2 diabetes mellitus with diabetic autonomic (poly)neuropathy: Secondary | ICD-10-CM | POA: Diagnosis not present

## 2018-11-01 ENCOUNTER — Other Ambulatory Visit: Payer: Self-pay | Admitting: Interventional Cardiology

## 2018-11-05 DIAGNOSIS — M5136 Other intervertebral disc degeneration, lumbar region: Secondary | ICD-10-CM | POA: Insufficient documentation

## 2018-11-10 DIAGNOSIS — N3281 Overactive bladder: Secondary | ICD-10-CM | POA: Diagnosis not present

## 2018-11-13 DIAGNOSIS — E6609 Other obesity due to excess calories: Secondary | ICD-10-CM | POA: Diagnosis not present

## 2018-11-13 DIAGNOSIS — M5116 Intervertebral disc disorders with radiculopathy, lumbar region: Secondary | ICD-10-CM | POA: Diagnosis not present

## 2018-11-13 DIAGNOSIS — I10 Essential (primary) hypertension: Secondary | ICD-10-CM | POA: Diagnosis not present

## 2018-11-13 DIAGNOSIS — E785 Hyperlipidemia, unspecified: Secondary | ICD-10-CM | POA: Diagnosis not present

## 2018-11-13 DIAGNOSIS — I4891 Unspecified atrial fibrillation: Secondary | ICD-10-CM | POA: Diagnosis not present

## 2018-11-13 DIAGNOSIS — R69 Illness, unspecified: Secondary | ICD-10-CM | POA: Diagnosis not present

## 2018-11-13 DIAGNOSIS — E1142 Type 2 diabetes mellitus with diabetic polyneuropathy: Secondary | ICD-10-CM | POA: Diagnosis not present

## 2018-11-13 DIAGNOSIS — I2510 Atherosclerotic heart disease of native coronary artery without angina pectoris: Secondary | ICD-10-CM | POA: Diagnosis not present

## 2018-11-13 DIAGNOSIS — Z794 Long term (current) use of insulin: Secondary | ICD-10-CM | POA: Diagnosis not present

## 2018-12-06 ENCOUNTER — Other Ambulatory Visit: Payer: Self-pay | Admitting: Interventional Cardiology

## 2019-01-14 DIAGNOSIS — G4733 Obstructive sleep apnea (adult) (pediatric): Secondary | ICD-10-CM | POA: Diagnosis not present

## 2019-02-09 ENCOUNTER — Encounter: Payer: Self-pay | Admitting: Physician Assistant

## 2019-02-09 ENCOUNTER — Telehealth: Payer: Medicare HMO | Admitting: Physician Assistant

## 2019-02-09 DIAGNOSIS — J309 Allergic rhinitis, unspecified: Secondary | ICD-10-CM

## 2019-02-09 MED ORDER — FLUTICASONE PROPIONATE 50 MCG/ACT NA SUSP: 1.0000 | Freq: Every day | NASAL | 0 refills | Status: DC

## 2019-02-09 NOTE — Progress Notes (Signed)
E visit for Allergic Rhinitis We are sorry that you are not feeling well.  Here is how we plan to help!  Based on what you have shared with me it looks like you have Allergic Rhinitis.  Rhinitis is when a reaction occurs that causes nasal congestion, runny nose, sneezing, and itching.  Most types of rhinitis are caused by an inflammation and are associated with symptoms in the eyes ears or throat. There are several types of rhinitis.  The most common are acute rhinitis, which is usually caused by a viral illness, allergic or seasonal rhinitis, and nonallergic or year-round rhinitis.  Nasal allergies occur certain times of the year.  Allergic rhinitis is caused when allergens in the air trigger the release of histamine in the body.  Histamine causes itching, swelling, and fluid to build up in the fragile linings of the nasal passages, sinuses and eyelids.  An itchy nose and clear discharge are common.  I also would recommend a nasal spray: Flonase 1 spray into each nostril once daily   HOME CARE:   You can use an over-the-counter saline nasal spray as needed  Avoid areas where there is heavy dust, mites, or molds  Stay indoors on windy days during the pollen season  Keep windows closed in home, at least in bedroom; use air conditioner.  Use high-efficiency house air filter  Keep windows closed in car, turn AC on re-circulate  Avoid playing out with dog during pollen season  GET HELP RIGHT AWAY IF:   If your symptoms do not improve within 10 days  You become short of breath  You develop yellow or green discharge from your nose for over 3 days  You have coughing fits  MAKE SURE YOU:   Understand these instructions  Will watch your condition  Will get help right away if you are not doing well or get worse  Thank you for choosing an e-visit. Your e-visit answers were reviewed by a board certified advanced clinical practitioner to complete your personal care plan. Depending  upon the condition, your plan could have included both over the counter or prescription medications. Please review your pharmacy choice. Be sure that the pharmacy you have chosen is open so that you can pick up your prescription now.  If there is a problem you may message your provider in [place] to have the prescription routed to another pharmacy. Your safety is important to [place]. If you have drug allergies check your prescription carefully.  For the next 24 hours, you can use MyChart to ask questions about today's visit, request a non-urgent call back, or ask for a work or school excuse from your e-visit provider. You will get an email in the next two days asking about your experience. I hope that your e-visit has been valuable and will speed your recovery.        I have spent 7 min in completion and review of this note- Lacy Duverney [facility]

## 2019-02-10 NOTE — Progress Notes (Signed)
Virtual Visit via Video Note   This visit type was conducted due to national recommendations for restrictions regarding the COVID-19 Pandemic (e.g. social distancing) in an effort to limit this patient's exposure and mitigate transmission in our community.  Due to his co-morbid illnesses, this patient is at least at moderate risk for complications without adequate follow up.  This format is felt to be most appropriate for this patient at this time.  All issues noted in this document were discussed and addressed.  A limited physical exam was performed with this format.  Please refer to the patient's chart for his consent to telehealth for [facility].   Date:  02/10/2019   ID:  Walter [name], Walter [name] 07/10/1946, MRN [id number]  Patient Location: Home Provider Location: Office  PCP:  Lavone Orn, MD  Cardiologist:  Sinclair Grooms, MD  Electrophysiologist:  None   Evaluation Performed:  Follow-Up Visit  Chief Complaint:  AF/Amio/CAD  History of Present Illness:    Walter [name] is a 73 y.o. male with  paroxysmal atrial fibrillation, CAD with prior CABG, hypertension, CKD 2, chronic anticoagulation therapy,prior embolic CVA,and amiodarone therapy.   [place] has no cardiovascular complaints.  He is walking daily between 30 and 45 minutes, twice each day.  No angina.  No symptoms to suggest atrial fibrillation.  Denies lower extremity swelling or medication side effects.  No bleeding on anticoagulation therapy.  The patient does not have symptoms concerning for COVID-19 infection (fever, chills, cough, or new shortness of breath).    Past Medical History:  Diagnosis Date  . Abdominal pain   . Cancer [facility])    prostate  . Chronic kidney disease   . Colon polyp   . Diabetes mellitus   . Gum disease   . Pleural effusion   . Stroke [facility])    Past Surgical History:  Procedure Laterality Date  . CATARACT EXTRACTION  2006 and 2007   bilateral  . COLON SURGERY    . COLONOSCOPY    .  CORONARY ARTERY BYPASS GRAFT       No outpatient medications have been marked as taking for the 02/11/19 encounter (Appointment) with Belva Crome, MD.     Allergies:   Patient has no known allergies.   Social History   Tobacco Use  . Smoking status: Former Research scientist (life sciences)  . Smokeless tobacco: Never Used  Substance Use Topics  . Alcohol use: Yes    Alcohol/week: 3.0 standard drinks    Types: 1 Glasses of wine, 1 Cans of beer, 1 Shots of liquor per week    Comment: social  . Drug use: Yes    Types: Marijuana    Comment: frequently     Family Hx: The patient's family history includes Angina in his mother; Coronary artery disease in his father; Diabetes in his father; Stroke in his father.  ROS:   Please see the history of present illness.    No medication side effects. All other systems reviewed and are negative.   Prior CV studies:   The following studies were reviewed today:  No new data.  Labs/Other Tests and Data Reviewed:    EKG:  No ECG reviewed.  Recent Labs: 02/13/2018: ALT 51; BUN 19; Creatinine, Ser 1.32; Potassium 4.7; Sodium 137; TSH 6.010   Recent Lipid Panel Lab Results  Component Value Date/Time   CHOL 121 12/31/2014 05:55 AM   TRIG 208 (H) 12/31/2014 05:55 AM   HDL 35 (L) 12/31/2014 05:55 AM  CHOLHDL 3.5 12/31/2014 05:55 AM   LDLCALC 44 12/31/2014 05:55 AM    Wt Readings from Last 3 Encounters:  08/15/18 215 lb (97.5 kg)  02/13/18 213 lb 12.8 oz (97 kg)  06/20/17 202 lb 1.9 oz (91.7 kg)     Objective:    Vital Signs:  There were no vitals taken for this visit.   VITAL SIGNS:  reviewed GEN:  no acute distress RESPIRATORY:  normal respiratory effort, symmetric expansion CARDIOVASCULAR:  no peripheral edema NEURO:  alert and oriented x 3, no obvious focal deficit  ASSESSMENT & PLAN:    1. 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 Paroxysmal atrial fibrillation ([place])   3. Chronic  systolic heart failure ([place])   4. Chronic anticoagulation   5. Essential hypertension   6. Type 2 diabetes mellitus with diabetic peripheral angiopathy without gangrene, without long-term current use of insulin ([place])   7. On amiodarone therapy   8. Obstructive sleep apnea   9. OSA on CPAP   10. 2019 novel coronavirus disease (COVID-19)    PLAN:  1. Secondary prevention we discussed.  The importance of physical activity, greater than 150 minutes/week is reiterated. 2. No clinical recurrences on amiodarone low-dose. 3. No volume overload symptoms or clinical correlates. 4. No bleeding complications. 5. Stable with target 130/80.  A.M. blood pressure today is prior to medication. 6. Hemoglobin A1c 6.2 when last checked. 7. Hemoglobin and liver panel will be done within the next several weeks 8. He is compliant with CPAP.  Overall education and awareness concerning primary/secondary risk prevention was discussed in detail: LDL less than 70, hemoglobin A1c less than 7, blood pressure target less than 130/80 mmHg, >150 minutes of moderate aerobic activity per week, avoidance of smoking, weight control (via diet and exercise), and continued surveillance/management of/for obstructive sleep apnea.   COVID-19 Education: The signs and symptoms of COVID-19 were discussed with the patient and how to seek care for testing (follow up with PCP or arrange E-visit).  The importance of social distancing was discussed today.  Time:   Today, I have spent 15 minutes with the patient with telehealth technology discussing the above problems.     Medication Adjustments/Labs and Tests Ordered: Current medicines are reviewed at length with the patient today.  Concerns regarding medicines are outlined above.   Tests Ordered: No orders of the defined types were placed in this encounter.   Medication Changes: No orders of the defined types were placed in this encounter.   Disposition:  Follow up in 6  month(s)  Signed, Sinclair Grooms, MD  02/10/2019 8:48 PM    [place]

## 2019-02-11 ENCOUNTER — Telehealth (INDEPENDENT_AMBULATORY_CARE_PROVIDER_SITE_OTHER): Payer: Medicare HMO | Admitting: Interventional Cardiology

## 2019-02-11 ENCOUNTER — Encounter: Payer: Self-pay | Admitting: Interventional Cardiology

## 2019-02-11 ENCOUNTER — Other Ambulatory Visit: Payer: Self-pay

## 2019-02-11 VITALS — BP 147/80 | HR 58 | Ht 67.0 in | Wt 202.0 lb

## 2019-02-11 DIAGNOSIS — I25709 Atherosclerosis of coronary artery bypass graft(s), unspecified, with unspecified angina pectoris: Secondary | ICD-10-CM | POA: Diagnosis not present

## 2019-02-11 DIAGNOSIS — Z79899 Other long term (current) drug therapy: Secondary | ICD-10-CM

## 2019-02-11 DIAGNOSIS — I5022 Chronic systolic (congestive) heart failure: Secondary | ICD-10-CM

## 2019-02-11 DIAGNOSIS — G4733 Obstructive sleep apnea (adult) (pediatric): Secondary | ICD-10-CM

## 2019-02-11 DIAGNOSIS — I480 Paroxysmal atrial fibrillation: Secondary | ICD-10-CM

## 2019-02-11 DIAGNOSIS — I10 Essential (primary) hypertension: Secondary | ICD-10-CM

## 2019-02-11 DIAGNOSIS — U071 COVID-19: Secondary | ICD-10-CM

## 2019-02-11 DIAGNOSIS — Z7901 Long term (current) use of anticoagulants: Secondary | ICD-10-CM

## 2019-02-11 DIAGNOSIS — Z9989 Dependence on other enabling machines and devices: Secondary | ICD-10-CM

## 2019-02-11 DIAGNOSIS — E1151 Type 2 diabetes mellitus with diabetic peripheral angiopathy without gangrene: Secondary | ICD-10-CM

## 2019-02-11 NOTE — Telephone Encounter (Signed)
error 

## 2019-02-11 NOTE — Patient Instructions (Signed)
Medication Instructions:  Your physician recommends that you continue on your current medications as directed. Please refer to the Current Medication list given to you today.  If you need a refill on your cardiac medications before your next appointment, please call your pharmacy.   Lab work: BMET, Liver, Lipid, CBC, A1C, and TSH within the next few weeks.  You do need to be fasting for these labs (nothing to eat or drink after midnight except water and black coffee).  If you have labs (blood work) drawn today and your tests are completely normal, you will receive your results only by: Marland Kitchen MyChart Message (if you have MyChart) OR . A paper copy in the mail If you have any lab test that is abnormal or we need to change your treatment, we will call you to review the results.  Testing/Procedures: None  Follow-Up: At [facility], you and your health needs are our priority.  As part of our continuing mission to provide you with exceptional heart care, we have created designated Provider Care Teams.  These Care Teams include your primary Cardiologist (physician) and Advanced Practice Providers (APPs -  Physician Assistants and Nurse Practitioners) who all work together to provide you with the care you need, when you need it. You will need a follow up appointment in 6 months.  Please call our office 2 months in advance to schedule this appointment.  You may see Sinclair Grooms, MD or one of the following Advanced Practice Providers on your designated Care Team:   Truitt Merle, NP Cecilie Kicks, NP . Kathyrn Drown, NP  Any Other Special Instructions Will Be Listed Below (If Applicable).

## 2019-02-11 NOTE — Addendum Note (Signed)
Addended by: Loren Racer on: 02/11/2019 10:35 AM   Modules accepted: Orders

## 2019-02-13 DIAGNOSIS — R197 Diarrhea, unspecified: Secondary | ICD-10-CM | POA: Diagnosis not present

## 2019-02-18 DIAGNOSIS — N183 Chronic kidney disease, stage 3 (moderate): Secondary | ICD-10-CM | POA: Diagnosis not present

## 2019-02-18 DIAGNOSIS — Z794 Long term (current) use of insulin: Secondary | ICD-10-CM | POA: Diagnosis not present

## 2019-02-18 DIAGNOSIS — E1122 Type 2 diabetes mellitus with diabetic chronic kidney disease: Secondary | ICD-10-CM | POA: Diagnosis not present

## 2019-02-18 DIAGNOSIS — M2550 Pain in unspecified joint: Secondary | ICD-10-CM | POA: Diagnosis not present

## 2019-02-18 DIAGNOSIS — E1143 Type 2 diabetes mellitus with diabetic autonomic (poly)neuropathy: Secondary | ICD-10-CM | POA: Diagnosis not present

## 2019-02-18 DIAGNOSIS — Z5181 Encounter for therapeutic drug level monitoring: Secondary | ICD-10-CM | POA: Diagnosis not present

## 2019-02-20 ENCOUNTER — Other Ambulatory Visit: Payer: Self-pay | Admitting: Interventional Cardiology

## 2019-02-26 ENCOUNTER — Other Ambulatory Visit: Payer: Self-pay

## 2019-02-26 ENCOUNTER — Other Ambulatory Visit: Payer: Medicare HMO | Admitting: *Deleted

## 2019-02-26 DIAGNOSIS — I480 Paroxysmal atrial fibrillation: Secondary | ICD-10-CM

## 2019-02-26 DIAGNOSIS — Z7901 Long term (current) use of anticoagulants: Secondary | ICD-10-CM

## 2019-02-26 DIAGNOSIS — I5022 Chronic systolic (congestive) heart failure: Secondary | ICD-10-CM

## 2019-02-26 DIAGNOSIS — E1151 Type 2 diabetes mellitus with diabetic peripheral angiopathy without gangrene: Secondary | ICD-10-CM

## 2019-02-26 DIAGNOSIS — I10 Essential (primary) hypertension: Secondary | ICD-10-CM | POA: Diagnosis not present

## 2019-02-26 DIAGNOSIS — I25709 Atherosclerosis of coronary artery bypass graft(s), unspecified, with unspecified angina pectoris: Secondary | ICD-10-CM | POA: Diagnosis not present

## 2019-02-26 DIAGNOSIS — Z79899 Other long term (current) drug therapy: Secondary | ICD-10-CM | POA: Diagnosis not present

## 2019-02-27 LAB — HEPATIC FUNCTION PANEL
ALT: 63 IU/L — ABNORMAL HIGH (ref 0–44)
AST: 52 IU/L — ABNORMAL HIGH (ref 0–40)
Albumin: 4.1 g/dL (ref 3.7–4.7)
Alkaline Phosphatase: 48 IU/L (ref 39–117)
Bilirubin Total: 0.6 mg/dL (ref 0.0–1.2)
Bilirubin, Direct: 0.17 mg/dL (ref 0.00–0.40)
Total Protein: 6.5 g/dL (ref 6.0–8.5)

## 2019-02-27 LAB — LIPID PANEL
Chol/HDL Ratio: 2.1 ratio (ref 0.0–5.0)
Cholesterol, Total: 109 mg/dL (ref 100–199)
HDL: 51 mg/dL (ref >39)
LDL Calculated: 33 mg/dL (ref 0–99)
Triglycerides: 126 mg/dL (ref 0–149)
VLDL Cholesterol Cal: 25 mg/dL (ref 5–40)

## 2019-02-27 LAB — BASIC METABOLIC PANEL
BUN/Creatinine Ratio: 15 (ref 10–24)
BUN: 23 mg/dL (ref 8–27)
CO2: 24 mmol/L (ref 20–29)
Calcium: 9.7 mg/dL (ref 8.6–10.2)
Chloride: 100 mmol/L (ref 96–106)
Creatinine, Ser: 1.49 mg/dL — ABNORMAL HIGH (ref 0.76–1.27)
GFR calc Af Amer: 53 mL/min/{1.73_m2} — ABNORMAL LOW (ref >59)
GFR calc non Af Amer: 46 mL/min/{1.73_m2} — ABNORMAL LOW (ref >59)
Glucose: 117 mg/dL — ABNORMAL HIGH (ref 65–99)
Potassium: 4.7 mmol/L (ref 3.5–5.2)
Sodium: 138 mmol/L (ref 134–144)

## 2019-02-27 LAB — HEMOGLOBIN A1C
Est. average glucose Bld gHb Est-mCnc: 157 mg/dL
Hgb A1c MFr Bld: 7.1 % — ABNORMAL HIGH (ref 4.8–5.6)

## 2019-02-27 LAB — CBC
Hematocrit: 38.9 % (ref 37.5–51.0)
Hemoglobin: 13.5 g/dL (ref 13.0–17.7)
MCH: 31.4 pg (ref 26.6–33.0)
MCHC: 34.7 g/dL (ref 31.5–35.7)
MCV: 91 fL (ref 79–97)
Platelets: 244 10*3/uL (ref 150–450)
RBC: 4.3 x10E6/uL (ref 4.14–5.80)
RDW: 13.2 % (ref 11.6–15.4)
WBC: 6.5 10*3/uL (ref 3.4–10.8)

## 2019-02-27 LAB — TSH: TSH: 12.33 u[IU]/mL — ABNORMAL HIGH (ref 0.450–4.500)

## 2019-03-03 ENCOUNTER — Telehealth: Payer: Self-pay | Admitting: *Deleted

## 2019-03-03 DIAGNOSIS — E032 Hypothyroidism due to medicaments and other exogenous substances: Secondary | ICD-10-CM

## 2019-03-03 NOTE — Telephone Encounter (Signed)
-----   Message from Belva Crome, MD sent at 02/28/2019  3:45 PM EDT ----- Let the patient know the labs are okay except the amiodarone is causing underactive thyroid. Please start levothyroxine 50 micrograms daily. TSH in 4 weeks. A copy will be sent to Lavone Orn, MD

## 2019-03-03 NOTE — Telephone Encounter (Signed)
Spoke with pt and went over results and recommendations per Dr. Tamala Julian.  Pt verbalized understanding and was in agreement with plan.  He will have labs drawn 6/23.

## 2019-03-04 DIAGNOSIS — N3281 Overactive bladder: Secondary | ICD-10-CM | POA: Diagnosis not present

## 2019-03-04 DIAGNOSIS — Z8546 Personal history of malignant neoplasm of prostate: Secondary | ICD-10-CM | POA: Diagnosis not present

## 2019-03-04 DIAGNOSIS — C61 Malignant neoplasm of prostate: Secondary | ICD-10-CM | POA: Diagnosis not present

## 2019-03-04 DIAGNOSIS — N5201 Erectile dysfunction due to arterial insufficiency: Secondary | ICD-10-CM | POA: Diagnosis not present

## 2019-03-30 ENCOUNTER — Telehealth: Payer: Self-pay | Admitting: *Deleted

## 2019-03-30 NOTE — Telephone Encounter (Signed)
    COVID-19 Pre-Screening Questions:  . In the past 7 to 10 days have you had a cough,  shortness of breath, headache, congestion, fever (100 or greater) body aches, chills, sore throat, or sudden loss of taste or sense of smell? . Have you been around anyone with known Covid 19. . Have you been around anyone who is awaiting Covid 19 test results in the past 7 to 10 days? . Have you been around anyone who has been exposed to Covid 19, or has mentioned symptoms of Covid 19 within the past 7 to 10 days?  If you have any concerns/questions about symptoms patients report during screening (either on the phone or at threshold). Contact the provider seeing the patient or DOD for further guidance.  If neither are available contact a member of the leadership team.            Contacted Pt via phone call . Answered all Covid 19 questions no. Has a mask.KB

## 2019-03-31 ENCOUNTER — Other Ambulatory Visit: Payer: Self-pay

## 2019-03-31 ENCOUNTER — Other Ambulatory Visit: Payer: Medicare HMO

## 2019-03-31 DIAGNOSIS — E032 Hypothyroidism due to medicaments and other exogenous substances: Secondary | ICD-10-CM

## 2019-03-31 LAB — TSH: TSH: 6.74 u[IU]/mL — ABNORMAL HIGH (ref 0.450–4.500)

## 2019-04-10 ENCOUNTER — Other Ambulatory Visit: Payer: Self-pay | Admitting: Interventional Cardiology

## 2019-04-10 DIAGNOSIS — I4891 Unspecified atrial fibrillation: Secondary | ICD-10-CM

## 2019-05-05 DIAGNOSIS — H52203 Unspecified astigmatism, bilateral: Secondary | ICD-10-CM | POA: Diagnosis not present

## 2019-05-05 DIAGNOSIS — D485 Neoplasm of uncertain behavior of skin: Secondary | ICD-10-CM | POA: Diagnosis not present

## 2019-05-05 DIAGNOSIS — H0100A Unspecified blepharitis right eye, upper and lower eyelids: Secondary | ICD-10-CM | POA: Diagnosis not present

## 2019-05-05 DIAGNOSIS — H43813 Vitreous degeneration, bilateral: Secondary | ICD-10-CM | POA: Diagnosis not present

## 2019-05-05 DIAGNOSIS — E119 Type 2 diabetes mellitus without complications: Secondary | ICD-10-CM | POA: Diagnosis not present

## 2019-05-26 DIAGNOSIS — E1122 Type 2 diabetes mellitus with diabetic chronic kidney disease: Secondary | ICD-10-CM | POA: Diagnosis not present

## 2019-05-26 DIAGNOSIS — I480 Paroxysmal atrial fibrillation: Secondary | ICD-10-CM | POA: Diagnosis not present

## 2019-05-26 DIAGNOSIS — I10 Essential (primary) hypertension: Secondary | ICD-10-CM | POA: Diagnosis not present

## 2019-05-26 DIAGNOSIS — Z8546 Personal history of malignant neoplasm of prostate: Secondary | ICD-10-CM | POA: Diagnosis not present

## 2019-05-26 DIAGNOSIS — C44319 Basal cell carcinoma of skin of other parts of face: Secondary | ICD-10-CM | POA: Diagnosis not present

## 2019-05-26 DIAGNOSIS — I2510 Atherosclerotic heart disease of native coronary artery without angina pectoris: Secondary | ICD-10-CM | POA: Diagnosis not present

## 2019-05-26 DIAGNOSIS — E782 Mixed hyperlipidemia: Secondary | ICD-10-CM | POA: Diagnosis not present

## 2019-05-26 DIAGNOSIS — E1121 Type 2 diabetes mellitus with diabetic nephropathy: Secondary | ICD-10-CM | POA: Diagnosis not present

## 2019-05-26 DIAGNOSIS — N183 Chronic kidney disease, stage 3 (moderate): Secondary | ICD-10-CM | POA: Diagnosis not present

## 2019-05-26 DIAGNOSIS — D509 Iron deficiency anemia, unspecified: Secondary | ICD-10-CM | POA: Diagnosis not present

## 2019-05-26 DIAGNOSIS — N400 Enlarged prostate without lower urinary tract symptoms: Secondary | ICD-10-CM | POA: Diagnosis not present

## 2019-05-27 DIAGNOSIS — C44319 Basal cell carcinoma of skin of other parts of face: Secondary | ICD-10-CM | POA: Diagnosis not present

## 2019-06-03 DIAGNOSIS — L570 Actinic keratosis: Secondary | ICD-10-CM | POA: Diagnosis not present

## 2019-07-01 DIAGNOSIS — M545 Low back pain: Secondary | ICD-10-CM | POA: Diagnosis not present

## 2019-07-01 DIAGNOSIS — M5416 Radiculopathy, lumbar region: Secondary | ICD-10-CM | POA: Diagnosis not present

## 2019-07-07 DIAGNOSIS — C44319 Basal cell carcinoma of skin of other parts of face: Secondary | ICD-10-CM | POA: Diagnosis not present

## 2019-07-09 DIAGNOSIS — M5136 Other intervertebral disc degeneration, lumbar region: Secondary | ICD-10-CM | POA: Diagnosis not present

## 2019-07-10 DIAGNOSIS — N400 Enlarged prostate without lower urinary tract symptoms: Secondary | ICD-10-CM | POA: Diagnosis not present

## 2019-07-10 DIAGNOSIS — I10 Essential (primary) hypertension: Secondary | ICD-10-CM | POA: Diagnosis not present

## 2019-07-10 DIAGNOSIS — I2510 Atherosclerotic heart disease of native coronary artery without angina pectoris: Secondary | ICD-10-CM | POA: Diagnosis not present

## 2019-07-10 DIAGNOSIS — E1121 Type 2 diabetes mellitus with diabetic nephropathy: Secondary | ICD-10-CM | POA: Diagnosis not present

## 2019-07-10 DIAGNOSIS — Z8546 Personal history of malignant neoplasm of prostate: Secondary | ICD-10-CM | POA: Diagnosis not present

## 2019-07-10 DIAGNOSIS — E1122 Type 2 diabetes mellitus with diabetic chronic kidney disease: Secondary | ICD-10-CM | POA: Diagnosis not present

## 2019-07-10 DIAGNOSIS — I480 Paroxysmal atrial fibrillation: Secondary | ICD-10-CM | POA: Diagnosis not present

## 2019-07-10 DIAGNOSIS — N183 Chronic kidney disease, stage 3 unspecified: Secondary | ICD-10-CM | POA: Diagnosis not present

## 2019-07-10 DIAGNOSIS — D509 Iron deficiency anemia, unspecified: Secondary | ICD-10-CM | POA: Diagnosis not present

## 2019-07-10 DIAGNOSIS — E782 Mixed hyperlipidemia: Secondary | ICD-10-CM | POA: Diagnosis not present

## 2019-07-14 DIAGNOSIS — R69 Illness, unspecified: Secondary | ICD-10-CM | POA: Diagnosis not present

## 2019-07-20 DIAGNOSIS — M545 Low back pain: Secondary | ICD-10-CM | POA: Diagnosis not present

## 2019-08-18 DIAGNOSIS — Z79899 Other long term (current) drug therapy: Secondary | ICD-10-CM | POA: Diagnosis not present

## 2019-08-18 DIAGNOSIS — N1831 Chronic kidney disease, stage 3a: Secondary | ICD-10-CM | POA: Diagnosis not present

## 2019-08-18 DIAGNOSIS — E1122 Type 2 diabetes mellitus with diabetic chronic kidney disease: Secondary | ICD-10-CM | POA: Diagnosis not present

## 2019-08-18 DIAGNOSIS — M2550 Pain in unspecified joint: Secondary | ICD-10-CM | POA: Diagnosis not present

## 2019-08-18 DIAGNOSIS — E1143 Type 2 diabetes mellitus with diabetic autonomic (poly)neuropathy: Secondary | ICD-10-CM | POA: Diagnosis not present

## 2019-08-19 ENCOUNTER — Encounter: Payer: Self-pay | Admitting: Interventional Cardiology

## 2019-08-19 ENCOUNTER — Telehealth: Payer: Self-pay | Admitting: Interventional Cardiology

## 2019-08-19 DIAGNOSIS — R55 Syncope and collapse: Secondary | ICD-10-CM | POA: Diagnosis not present

## 2019-08-19 DIAGNOSIS — Z794 Long term (current) use of insulin: Secondary | ICD-10-CM | POA: Diagnosis not present

## 2019-08-19 DIAGNOSIS — Z5181 Encounter for therapeutic drug level monitoring: Secondary | ICD-10-CM | POA: Diagnosis not present

## 2019-08-19 DIAGNOSIS — I480 Paroxysmal atrial fibrillation: Secondary | ICD-10-CM

## 2019-08-19 DIAGNOSIS — R7989 Other specified abnormal findings of blood chemistry: Secondary | ICD-10-CM | POA: Diagnosis not present

## 2019-08-19 DIAGNOSIS — N1831 Chronic kidney disease, stage 3a: Secondary | ICD-10-CM | POA: Diagnosis not present

## 2019-08-19 DIAGNOSIS — E1122 Type 2 diabetes mellitus with diabetic chronic kidney disease: Secondary | ICD-10-CM | POA: Diagnosis not present

## 2019-08-19 DIAGNOSIS — I2510 Atherosclerotic heart disease of native coronary artery without angina pectoris: Secondary | ICD-10-CM | POA: Diagnosis not present

## 2019-08-19 DIAGNOSIS — E1143 Type 2 diabetes mellitus with diabetic autonomic (poly)neuropathy: Secondary | ICD-10-CM | POA: Diagnosis not present

## 2019-08-19 NOTE — Telephone Encounter (Signed)
  Patient was told by Dr Delrae Rend to call our office to make sure that Dr Tamala Julian looks at the EKG he had done today that is being sent over by that office. Patient would like to know what Dr Tamala Julian thinks.

## 2019-08-20 NOTE — Telephone Encounter (Signed)
As of this morning, EKG not received.  Called Dr. Cindra Eves office and requested they send that over.  Spoke with medical records and provided fax number.

## 2019-08-20 NOTE — Telephone Encounter (Signed)
Fax received.  Interpreted as Sinus Loletha Grayer with a rate of 57.  Will give to Dr. Tamala Julian to review.

## 2019-08-21 NOTE — Telephone Encounter (Signed)
Needs a 30 day monitor and may need Loop

## 2019-08-21 NOTE — Telephone Encounter (Signed)
Spoke with Dr. Tamala Julian- no issues unless pt having sx.  Spoke with pt and he states the reason Dr. Buddy Duty did the EKG and sent it over is because he recently had an episode where he "thinks he fainted".  States he was turning the corner and next thing he knew, he was on the floor.  Denies any lightheadedness or dizziness prior.  Spoke with pt about the possibility of a monitor vs waiting to see if it happens again.  Advised I will send message to Dr. Tamala Julian to let him know what happened and get his recommendations.  Pt appreciative for call.

## 2019-08-24 ENCOUNTER — Telehealth: Payer: Self-pay

## 2019-08-24 NOTE — Telephone Encounter (Signed)
30 day Event Monitor ordered.

## 2019-08-24 NOTE — Telephone Encounter (Signed)
Left message to call back  

## 2019-08-24 NOTE — Telephone Encounter (Signed)
Spoke with pt and went over recommendations.  Reviewed monitor instructions and verified address.  Pt verbalized understanding and was in agreement with this plan.

## 2019-08-24 NOTE — Telephone Encounter (Signed)
-----   Message from Loren Racer, RN sent at 08/24/2019 10:55 AM EST ----- 30 day event monitor ordered for pt.  Instructions reviewed and address verified.

## 2019-08-25 ENCOUNTER — Ambulatory Visit
Admission: RE | Admit: 2019-08-25 | Discharge: 2019-08-25 | Disposition: A | Discharge status: Home / Self Care | Payer: Medicare HMO | Source: Ambulatory Visit | Attending: Internal Medicine | Admitting: Internal Medicine

## 2019-08-25 ENCOUNTER — Other Ambulatory Visit: Payer: Self-pay | Admitting: Internal Medicine

## 2019-08-25 DIAGNOSIS — R0781 Pleurodynia: Secondary | ICD-10-CM

## 2019-08-25 DIAGNOSIS — E1121 Type 2 diabetes mellitus with diabetic nephropathy: Secondary | ICD-10-CM | POA: Diagnosis not present

## 2019-08-25 DIAGNOSIS — S2231XA Fracture of one rib, right side, initial encounter for closed fracture: Secondary | ICD-10-CM | POA: Diagnosis not present

## 2019-08-25 DIAGNOSIS — W19XXXS Unspecified fall, sequela: Secondary | ICD-10-CM | POA: Diagnosis not present

## 2019-08-27 ENCOUNTER — Other Ambulatory Visit (HOSPITAL_COMMUNITY): Payer: Self-pay | Admitting: Internal Medicine

## 2019-08-27 DIAGNOSIS — R55 Syncope and collapse: Secondary | ICD-10-CM

## 2019-08-27 DIAGNOSIS — I25119 Atherosclerotic heart disease of native coronary artery with unspecified angina pectoris: Secondary | ICD-10-CM

## 2019-08-27 DIAGNOSIS — I480 Paroxysmal atrial fibrillation: Secondary | ICD-10-CM

## 2019-09-03 ENCOUNTER — Encounter: Payer: Self-pay | Admitting: Interventional Cardiology

## 2019-09-03 ENCOUNTER — Ambulatory Visit (INDEPENDENT_AMBULATORY_CARE_PROVIDER_SITE_OTHER): Payer: Medicare HMO

## 2019-09-03 DIAGNOSIS — R55 Syncope and collapse: Secondary | ICD-10-CM

## 2019-09-03 DIAGNOSIS — I480 Paroxysmal atrial fibrillation: Secondary | ICD-10-CM

## 2019-09-05 ENCOUNTER — Telehealth: Payer: Self-pay | Admitting: Cardiology

## 2019-09-05 NOTE — Telephone Encounter (Signed)
   I was called by "preventice" company due to device alert. Patient had an episode of atrial fibrillation with rate around 100 bpm. The company said the episode lasted at least a minute, and a soon after transmission showed he was back in sinus rhythm. They could not confirm exact duration of a-fib episode. I called [name] and updated. He reports no palpitations, syncope, or other symptoms of atrial fibrillation. He continues to take his xarelto as prescribed for stroke prevention. He voiced appreciate and understanding. I will route this message to Dr. Tamala Julian.   Walter Edis, MD 09/05/2019, 11:43 PM

## 2019-09-06 NOTE — Telephone Encounter (Signed)
Thanks George!

## 2019-09-07 NOTE — Telephone Encounter (Signed)
Day 3 Critical report from [employer] received reporting afib 100bpm on 09/05/19 at 7:26 pm (CST).  Will provide to Dr. Gerhard Perches nurse for review/signature and to have scanned into Epic.

## 2019-09-08 ENCOUNTER — Ambulatory Visit (HOSPITAL_COMMUNITY): Payer: Medicare HMO | Attending: Cardiovascular Disease

## 2019-09-08 ENCOUNTER — Other Ambulatory Visit: Payer: Self-pay

## 2019-09-08 DIAGNOSIS — I480 Paroxysmal atrial fibrillation: Secondary | ICD-10-CM | POA: Insufficient documentation

## 2019-09-08 DIAGNOSIS — I25119 Atherosclerotic heart disease of native coronary artery with unspecified angina pectoris: Secondary | ICD-10-CM | POA: Diagnosis not present

## 2019-09-08 DIAGNOSIS — R55 Syncope and collapse: Secondary | ICD-10-CM | POA: Insufficient documentation

## 2019-09-17 DIAGNOSIS — Z79899 Other long term (current) drug therapy: Secondary | ICD-10-CM | POA: Diagnosis not present

## 2019-09-22 DIAGNOSIS — I2510 Atherosclerotic heart disease of native coronary artery without angina pectoris: Secondary | ICD-10-CM | POA: Diagnosis not present

## 2019-09-22 DIAGNOSIS — E1122 Type 2 diabetes mellitus with diabetic chronic kidney disease: Secondary | ICD-10-CM | POA: Diagnosis not present

## 2019-09-22 DIAGNOSIS — C61 Malignant neoplasm of prostate: Secondary | ICD-10-CM | POA: Diagnosis not present

## 2019-09-22 DIAGNOSIS — D509 Iron deficiency anemia, unspecified: Secondary | ICD-10-CM | POA: Diagnosis not present

## 2019-09-22 DIAGNOSIS — I10 Essential (primary) hypertension: Secondary | ICD-10-CM | POA: Diagnosis not present

## 2019-09-22 DIAGNOSIS — Z8546 Personal history of malignant neoplasm of prostate: Secondary | ICD-10-CM | POA: Diagnosis not present

## 2019-09-22 DIAGNOSIS — N400 Enlarged prostate without lower urinary tract symptoms: Secondary | ICD-10-CM | POA: Diagnosis not present

## 2019-09-22 DIAGNOSIS — I480 Paroxysmal atrial fibrillation: Secondary | ICD-10-CM | POA: Diagnosis not present

## 2019-09-22 DIAGNOSIS — E1121 Type 2 diabetes mellitus with diabetic nephropathy: Secondary | ICD-10-CM | POA: Diagnosis not present

## 2019-09-22 DIAGNOSIS — E782 Mixed hyperlipidemia: Secondary | ICD-10-CM | POA: Diagnosis not present

## 2019-10-06 DIAGNOSIS — L821 Other seborrheic keratosis: Secondary | ICD-10-CM | POA: Diagnosis not present

## 2019-10-06 DIAGNOSIS — Z85828 Personal history of other malignant neoplasm of skin: Secondary | ICD-10-CM | POA: Diagnosis not present

## 2019-10-06 DIAGNOSIS — L814 Other melanin hyperpigmentation: Secondary | ICD-10-CM | POA: Diagnosis not present

## 2019-10-06 DIAGNOSIS — D229 Melanocytic nevi, unspecified: Secondary | ICD-10-CM | POA: Diagnosis not present

## 2019-10-16 DIAGNOSIS — G4733 Obstructive sleep apnea (adult) (pediatric): Secondary | ICD-10-CM | POA: Diagnosis not present

## 2019-10-27 ENCOUNTER — Other Ambulatory Visit: Payer: Self-pay | Admitting: Interventional Cardiology

## 2019-11-01 NOTE — Progress Notes (Signed)
Cardiology Office Note:    Date:  11/02/2019   ID:  Walter [name], DOB Dec 27, 1945, MRN [id number]  PCP:  Lavone Orn, MD  Cardiologist:  Sinclair Grooms, MD   Referring MD: Lavone Orn, MD   Chief Complaint  Patient presents with  . Atrial Fibrillation  . Hypertension  . Coronary Artery Disease    History of Present Illness:    Walter [name] is a 74 y.o. male with a hx of paroxysmal atrial fibrillation, CAD with prior CABG, hypertension, CKD 2, chronic anticoagulation therapy,prior embolic CVA,and amiodarone therapy.   No complaints.  Recent continuous monitor demonstrated low burden of atrial fibrillation lasting seconds.  He denies angina, has not had syncope, denies palpitations.  He has no peripheral edema and denies orthopnea and PND.    Past Medical History:  Diagnosis Date  . Abdominal pain   . Cancer [facility])    prostate  . Chronic kidney disease   . Colon polyp   . Diabetes mellitus   . Gum disease   . Pleural effusion   . Stroke [facility])     Past Surgical History:  Procedure Laterality Date  . CATARACT EXTRACTION  2006 and 2007   bilateral  . COLON SURGERY    . COLONOSCOPY    . CORONARY ARTERY BYPASS GRAFT      Current Medications: Current Meds  Medication Sig  . amiodarone (PACERONE) 200 MG tablet Take 0.5 tablets (100 mg total) by mouth daily.  Marland Kitchen buPROPion (WELLBUTRIN XL) 150 MG 24 hr tablet Take 150 mg by mouth daily.  Marland Kitchen escitalopram (LEXAPRO) 20 MG tablet Take 20 mg by mouth daily.    . fluticasone (FLONASE) 50 MCG/ACT nasal spray Place 1 spray into both nostrils daily.  Marland Kitchen levocetirizine (XYZAL) 5 MG tablet Take 5 mg by mouth daily.  Marland Kitchen lisinopril (ZESTRIL) 20 MG tablet Take 1 tablet (20 mg total) by mouth daily.  . metFORMIN (GLUCOPHAGE-XR) 500 MG 24 hr tablet Take 1,000 mg by mouth 2 (two) times a day.  . metoprolol succinate (TOPROL-XL) 50 MG 24 hr tablet TAKE ONE AND ONE-HALF TABLETS DAILY WITH OR IMMEDIATELY FOLLOWING A MEAL  . Omega-3-acid  Ethyl Esters (LOVAZA PO) Take 1 tablet by mouth 2 (two) times daily.   . rosuvastatin (CRESTOR) 10 MG tablet Take 10 mg by mouth at bedtime.   . tamsulosin (FLOMAX) 0.4 MG CAPS capsule Take 0.4 mg by mouth daily.  . TOVIAZ 8 MG TB24 tablet Take 8 mg by mouth daily.  . TRESIBA FLEXTOUCH 200 UNIT/ML SOPN Inject 56 Units into the skin daily.   Alveda Reasons 20 MG TABS tablet TAKE 1 TABLET DAILY  . [DISCONTINUED] amiodarone (PACERONE) 200 MG tablet TAKE 1 TABLET DAILY     Allergies:   Patient has no known allergies.   Social History   Socioeconomic History  . Marital status: Married    Spouse name: Not on file  . Number of children: Not on file  . Years of education: Not on file  . Highest education level: Not on file  Occupational History  . Not on file  Tobacco Use  . Smoking status: Former Research scientist (life sciences)  . Smokeless tobacco: Never Used  Substance and Sexual Activity  . Alcohol use: Yes    Alcohol/week: 3.0 standard drinks    Types: 1 Glasses of wine, 1 Cans of beer, 1 Shots of liquor per week    Comment: social  . Drug use: Yes    Types: Marijuana  Comment: frequently  . Sexual activity: Not on file  Other Topics Concern  . Not on file  Social History Narrative  . Not on file   Social Determinants of Health   Financial Resource Strain:   . Difficulty of Paying Living Expenses: Not on file  Food Insecurity:   . Worried About Charity fundraiser in the Last Year: Not on file  . Ran Out of Food in the Last Year: Not on file  Transportation Needs:   . Lack of Transportation (Medical): Not on file  . Lack of Transportation (Non-Medical): Not on file  Physical Activity:   . Days of Exercise per Week: Not on file  . Minutes of Exercise per Session: Not on file  Stress:   . Feeling of Stress : Not on file  Social Connections:   . Frequency of Communication with Friends and Family: Not on file  . Frequency of Social Gatherings with Friends and Family: Not on file  . Attends  Religious Services: Not on file  . Active Member of Clubs or Organizations: Not on file  . Attends Archivist Meetings: Not on file  . Marital Status: Not on file     Family History: The patient's family history includes Angina in his mother; Coronary artery disease in his father; Diabetes in his father; Stroke in his father.  ROS:   Please see the history of present illness.    Somewhat depressed and frustrated by the COVID-19 pandemic.  Unable to see his grandchildren.  He tires easily.  Not sleeping as well as he would like.  Has anxiety about getting the COVID-19 vaccine from standpoint of unavailability.  All other systems reviewed and are negative.  EKGs/Labs/Other Studies Reviewed:    The following studies were reviewed today: Continuous Monitor 10/2019: Study Highlights   NSR and SB  Paroxysmal atrial fibrillation, < 1% burden.  Episodes last seconds.  No ventricular ectopy.       EKG:  EKG is not performed today.  Recent Labs: 02/26/2019: ALT 63; BUN 23; Creatinine, Ser 1.49; Hemoglobin 13.5; Platelets 244; Potassium 4.7; Sodium 138 03/31/2019: TSH 6.740  Recent Lipid Panel    Component Value Date/Time   CHOL 109 02/26/2019 0824   TRIG 126 02/26/2019 0824   HDL 51 02/26/2019 0824   CHOLHDL 2.1 02/26/2019 0824   CHOLHDL 3.5 12/31/2014 0555   VLDL 42 (H) 12/31/2014 0555   LDLCALC 33 02/26/2019 0824    Physical Exam:    VS:  BP 134/66   Pulse (!) 51   Ht 5\' 7"  (1.702 m)   Wt 206 lb 9.6 oz (93.7 kg)   SpO2 98%   BMI 32.36 kg/m     Wt Readings from Last 3 Encounters:  11/02/19 206 lb 9.6 oz (93.7 kg)  02/11/19 202 lb (91.6 kg)  08/15/18 215 lb (97.5 kg)     GEN: Moderate abdominal obesity.. No acute distress HEENT: Normal NECK: No JVD. LYMPHATICS: No lymphadenopathy CARDIAC:  RRR without murmur, gallop, or edema. VASCULAR:  Normal Pulses. No bruits. RESPIRATORY:  Clear to auscultation without rales, wheezing or rhonchi  ABDOMEN: Soft,  non-tender, non-distended, No pulsatile mass, MUSCULOSKELETAL: No deformity  SKIN: Warm and dry NEUROLOGIC:  Alert and oriented x 3 PSYCHIATRIC:  Normal affect   ASSESSMENT:    1. Coronary artery disease involving coronary bypass graft of native heart with angina pectoris ([place])   2. Chronic systolic heart failure ([place])   3. Chronic anticoagulation  4. Essential hypertension   5. OSA on CPAP   6. Type 2 diabetes mellitus with other circulatory complication, without long-term current use of insulin ([place])   7. On amiodarone therapy   8. Educated about COVID-19 virus infection   9. Paroxysmal atrial fibrillation (HCC)    PLAN:    In order of problems listed above:  1. He denies angina.  Secondary prevention is discussed. 2. No evidence of volume overload 3. No bleeding on rivaroxaban. 4. Excellent blood pressure control 5. Compliant with CPAP 6. Most recent A1c 6.9 in November 2020 7. Decrease amiodarone to 100 mg/day.  He has excellent suppression with 200 mg/day.  Hopefully, the lower dose will not lead to trouble but at the same time decrease risk of toxicity.  TSH when last checked by his primary physician was 7.6.  Decreasing amiodarone will likely help decrease thyroid toxicity.  TSH and liver panel will be done in 6 months. 8. 3W' S and COVID-19 vaccine were discussed and endorsed by the patient as practices and aspirations. 9. He will notify if any episodes of atrial fibrillation identified on his digital device or symptoms that suggest atrial fibrillation.  Overall education and awareness concerning primary/secondary risk prevention was discussed in detail: LDL less than 70, hemoglobin A1c less than 7, blood pressure target less than 130/80 mmHg, >150 minutes of moderate aerobic activity per week, avoidance of smoking, weight control (via diet and exercise), and continued surveillance/management of/for obstructive sleep apnea.    Medication Adjustments/Labs and Tests  Ordered: Current medicines are reviewed at length with the patient today.  Concerns regarding medicines are outlined above.  No orders of the defined types were placed in this encounter.  Meds ordered this encounter  Medications  . amiodarone (PACERONE) 200 MG tablet    Sig: Take 0.5 tablets (100 mg total) by mouth daily.    Dispense:  45 tablet    Refill:  3    Dose change    Patient Instructions  Medication Instructions:  1) DECREASE Amiodarone to 100mg  once daily  *If you need a refill on your cardiac medications before your next appointment, please call your pharmacy*  Lab Work: None If you have labs (blood work) drawn today and your tests are completely normal, you will receive your results only by: Marland Kitchen MyChart Message (if you have MyChart) OR . A paper copy in the mail If you have any lab test that is abnormal or we need to change your treatment, we will call you to review the results.  Testing/Procedures: None  Follow-Up: At [facility], you and your health needs are our priority.  As part of our continuing mission to provide you with exceptional heart care, we have created designated Provider Care Teams.  These Care Teams include your primary Cardiologist (physician) and Advanced Practice Providers (APPs -  Physician Assistants and Nurse Practitioners) who all work together to provide you with the care you need, when you need it.  Your next appointment:   6 month(s)  The format for your next appointment:   In Person  Provider:   You may see Sinclair Grooms, MD or one of the following Advanced Practice Providers on your designated Care Team:    Truitt Merle, NP  Cecilie Kicks, NP  Kathyrn Drown, NP   Other Instructions      Signed, Sinclair Grooms, MD  11/02/2019 1:04 PM    [place]

## 2019-11-02 ENCOUNTER — Other Ambulatory Visit: Payer: Medicare HMO

## 2019-11-02 ENCOUNTER — Other Ambulatory Visit: Payer: Self-pay

## 2019-11-02 ENCOUNTER — Ambulatory Visit (INDEPENDENT_AMBULATORY_CARE_PROVIDER_SITE_OTHER): Payer: Medicare HMO | Admitting: Interventional Cardiology

## 2019-11-02 ENCOUNTER — Encounter: Payer: Self-pay | Admitting: Interventional Cardiology

## 2019-11-02 VITALS — BP 134/66 | HR 51 | Ht 67.0 in | Wt 206.6 lb

## 2019-11-02 DIAGNOSIS — E1159 Type 2 diabetes mellitus with other circulatory complications: Secondary | ICD-10-CM

## 2019-11-02 DIAGNOSIS — Z7189 Other specified counseling: Secondary | ICD-10-CM

## 2019-11-02 DIAGNOSIS — I10 Essential (primary) hypertension: Secondary | ICD-10-CM

## 2019-11-02 DIAGNOSIS — Z79899 Other long term (current) drug therapy: Secondary | ICD-10-CM | POA: Diagnosis not present

## 2019-11-02 DIAGNOSIS — I110 Hypertensive heart disease with heart failure: Secondary | ICD-10-CM | POA: Diagnosis not present

## 2019-11-02 DIAGNOSIS — I5022 Chronic systolic (congestive) heart failure: Secondary | ICD-10-CM | POA: Diagnosis not present

## 2019-11-02 DIAGNOSIS — I480 Paroxysmal atrial fibrillation: Secondary | ICD-10-CM | POA: Diagnosis not present

## 2019-11-02 DIAGNOSIS — Z9989 Dependence on other enabling machines and devices: Secondary | ICD-10-CM

## 2019-11-02 DIAGNOSIS — G4733 Obstructive sleep apnea (adult) (pediatric): Secondary | ICD-10-CM

## 2019-11-02 DIAGNOSIS — Z7901 Long term (current) use of anticoagulants: Secondary | ICD-10-CM | POA: Diagnosis not present

## 2019-11-02 DIAGNOSIS — I25709 Atherosclerosis of coronary artery bypass graft(s), unspecified, with unspecified angina pectoris: Secondary | ICD-10-CM

## 2019-11-02 MED ORDER — AMIODARONE HCL 200 MG PO TABS: 100.0000 mg | ORAL_TABLET | Freq: Every day | ORAL | 3 refills | Status: DC

## 2019-11-02 NOTE — Patient Instructions (Signed)
Medication Instructions:  1) DECREASE Amiodarone to 100mg  once daily  *If you need a refill on your cardiac medications before your next appointment, please call your pharmacy*  Lab Work: None If you have labs (blood work) drawn today and your tests are completely normal, you will receive your results only by: Marland Kitchen MyChart Message (if you have MyChart) OR . A paper copy in the mail If you have any lab test that is abnormal or we need to change your treatment, we will call you to review the results.  Testing/Procedures: None  Follow-Up: At [facility], you and your health needs are our priority.  As part of our continuing mission to provide you with exceptional heart care, we have created designated Provider Care Teams.  These Care Teams include your primary Cardiologist (physician) and Advanced Practice Providers (APPs -  Physician Assistants and Nurse Practitioners) who all work together to provide you with the care you need, when you need it.  Your next appointment:   6 month(s)  The format for your next appointment:   In Person  Provider:   You may see Sinclair Grooms, MD or one of the following Advanced Practice Providers on your designated Care Team:    Truitt Merle, NP  Cecilie Kicks, NP  Kathyrn Drown, NP   Other Instructions

## 2019-11-26 ENCOUNTER — Ambulatory Visit: Payer: Medicare HMO

## 2019-11-30 ENCOUNTER — Ambulatory Visit: Payer: Medicare HMO | Attending: Internal Medicine

## 2019-11-30 DIAGNOSIS — Z23 Encounter for immunization: Secondary | ICD-10-CM

## 2019-11-30 NOTE — Progress Notes (Signed)
   Covid-19 Vaccination Clinic  Name:  Walter [name]    MRN: [id number] DOB: 1946-03-21  11/30/2019  Walter [name] was observed post Covid-19 immunization for 15 minutes without incidence. He was provided with Vaccine Information Sheet and instruction to access the V-Safe system.   Walter [name] was instructed to call 911 with any severe reactions post vaccine: Marland Kitchen Difficulty breathing  . Swelling of your face and throat  . A fast heartbeat  . A bad rash all over your body  . Dizziness and weakness    Immunizations Administered    Name Date Dose VIS Date Route   Moderna COVID-19 Vaccine 11/30/2019 10:14 AM 0.5 mL 09/08/2019 Intramuscular   Manufacturer: Moderna   Lot: [id number]   [id number]

## 2019-12-29 ENCOUNTER — Ambulatory Visit: Payer: Medicare HMO | Attending: Family

## 2019-12-29 DIAGNOSIS — Z23 Encounter for immunization: Secondary | ICD-10-CM

## 2019-12-29 NOTE — Progress Notes (Signed)
   Covid-19 Vaccination Clinic  Name:  Walter [name]    MRN: [id number] DOB: [birth year]  12/29/2019  [name] was observed post Covid-19 immunization for 15 minutes without incident. He was provided with Vaccine Information Sheet and instruction to access the V-Safe system.   [name] was instructed to call 911 with any severe reactions post vaccine: Marland Kitchen Difficulty breathing  . Swelling of face and throat  . A fast heartbeat  . A bad rash all over body  . Dizziness and weakness   Immunizations Administered    Name Date Dose VIS Date Route   Moderna COVID-19 Vaccine 12/29/2019 12:08 PM 0.5 mL 09/08/2019 Intramuscular   Manufacturer: Moderna   LotMV:[id number]   [id number]

## 2020-02-15 ENCOUNTER — Other Ambulatory Visit: Payer: Self-pay | Admitting: Interventional Cardiology

## 2020-02-29 ENCOUNTER — Other Ambulatory Visit: Payer: Self-pay | Admitting: Interventional Cardiology

## 2020-02-29 NOTE — Telephone Encounter (Signed)
Prescription refill request for Xarelto received.   Last office visit: Walter [name] 11/02/2019 Weight: 93.7 kg Age: 74 y.o. Scr:  1.44 08/18/2019 CrCl: 59 ml/min   Prescription refill sent.

## 2020-04-08 ENCOUNTER — Other Ambulatory Visit: Payer: Self-pay

## 2020-04-08 ENCOUNTER — Emergency Department (HOSPITAL_COMMUNITY)
Admission: EM | Admit: 2020-04-08 | Discharge: 2020-04-09 | Disposition: A | Discharge status: Home / Self Care | Payer: Medicare HMO | Attending: Emergency Medicine | Admitting: Emergency Medicine

## 2020-04-08 ENCOUNTER — Encounter (HOSPITAL_COMMUNITY): Payer: Self-pay

## 2020-04-08 DIAGNOSIS — Z8546 Personal history of malignant neoplasm of prostate: Secondary | ICD-10-CM | POA: Insufficient documentation

## 2020-04-08 DIAGNOSIS — Z794 Long term (current) use of insulin: Secondary | ICD-10-CM | POA: Diagnosis not present

## 2020-04-08 DIAGNOSIS — Y9389 Activity, other specified: Secondary | ICD-10-CM | POA: Diagnosis not present

## 2020-04-08 DIAGNOSIS — I5022 Chronic systolic (congestive) heart failure: Secondary | ICD-10-CM | POA: Diagnosis not present

## 2020-04-08 DIAGNOSIS — I130 Hypertensive heart and chronic kidney disease with heart failure and stage 1 through stage 4 chronic kidney disease, or unspecified chronic kidney disease: Secondary | ICD-10-CM | POA: Insufficient documentation

## 2020-04-08 DIAGNOSIS — S61201A Unspecified open wound of left index finger without damage to nail, initial encounter: Secondary | ICD-10-CM | POA: Insufficient documentation

## 2020-04-08 DIAGNOSIS — N189 Chronic kidney disease, unspecified: Secondary | ICD-10-CM | POA: Insufficient documentation

## 2020-04-08 DIAGNOSIS — S61002A Unspecified open wound of left thumb without damage to nail, initial encounter: Secondary | ICD-10-CM | POA: Insufficient documentation

## 2020-04-08 DIAGNOSIS — E1122 Type 2 diabetes mellitus with diabetic chronic kidney disease: Secondary | ICD-10-CM | POA: Diagnosis not present

## 2020-04-08 DIAGNOSIS — Z87891 Personal history of nicotine dependence: Secondary | ICD-10-CM | POA: Diagnosis not present

## 2020-04-08 DIAGNOSIS — I2510 Atherosclerotic heart disease of native coronary artery without angina pectoris: Secondary | ICD-10-CM | POA: Insufficient documentation

## 2020-04-08 DIAGNOSIS — Y929 Unspecified place or not applicable: Secondary | ICD-10-CM | POA: Insufficient documentation

## 2020-04-08 DIAGNOSIS — Z951 Presence of aortocoronary bypass graft: Secondary | ICD-10-CM | POA: Diagnosis not present

## 2020-04-08 DIAGNOSIS — S61209A Unspecified open wound of unspecified finger without damage to nail, initial encounter: Secondary | ICD-10-CM

## 2020-04-08 DIAGNOSIS — S6992XA Unspecified injury of left wrist, hand and finger(s), initial encounter: Secondary | ICD-10-CM | POA: Diagnosis present

## 2020-04-08 DIAGNOSIS — Y999 Unspecified external cause status: Secondary | ICD-10-CM | POA: Diagnosis not present

## 2020-04-08 DIAGNOSIS — Z79899 Other long term (current) drug therapy: Secondary | ICD-10-CM | POA: Diagnosis not present

## 2020-04-08 DIAGNOSIS — W274XXA Contact with kitchen utensil, initial encounter: Secondary | ICD-10-CM | POA: Diagnosis not present

## 2020-04-08 LAB — CBG MONITORING, ED: Glucose-Capillary: 95 mg/dL (ref 70–99)

## 2020-04-08 NOTE — ED Notes (Signed)
Pt frustrated with how long he's been waiting. Pt was informed to stay but pt stated he was "disgusted" with how long he has to sit and wait while his finger bleeds. Pts finger currently wrapped.

## 2020-04-08 NOTE — ED Triage Notes (Signed)
Pt arrives to ED w/ c/o laceration to L hand. Pt cut finger w/ kitchen knife. Pt reports 4/10 pain. Pt takes xeralto.

## 2020-04-09 NOTE — ED Notes (Signed)
Pt was frustrated because his finger is bleeding, he is having to wait and he is convinced that his sugar is 75 when the CBG machine transmitted that it was 95.  I explained that we don't manually enter the glucose reading so he must have misread it.   I explained the triage and WR process and offered to redress his wound and he agreed.  A friend brought him food b/c he was hungry.  He appears in a better frame of mind now.  Finger wound is redressed.

## 2020-04-09 NOTE — ED Notes (Signed)
Pt sliced L thumb and L index finger while slicing bandages. Index pad of finger continues to bleed. Quick clot appiled to index finger with coban

## 2020-04-09 NOTE — ED Provider Notes (Signed)
[facility] EMERGENCY DEPARTMENT Provider Note   CSN: [id number] Arrival date & time: 04/08/20  2050     History Chief Complaint  Patient presents with  . Extremity Laceration    Walter [name] is a 74 y.o. male.  Patient presents to the emergency department with a chief complaint of finger injury. He states that he was using a mandolin slicer and cut the tip of his left index finger off. He is anticoagulated. He has had difficulty controlling the bleeding. This was why he came to the emergency department tonight. He denies any other associated symptoms or problems.  The history is provided by the patient. No language interpreter was used.       Past Medical History:  Diagnosis Date  . Abdominal pain   . Cancer [facility])    prostate  . Chronic kidney disease   . Colon polyp   . Diabetes mellitus   . Gum disease   . Pleural effusion   . Stroke [facility])     Patient Active Problem List   Diagnosis Date Noted  . Obstructive sleep apnea 06/20/2017  . Hyperlipidemia 04/17/2016  . Cerebrovascular accident (CVA) due to embolism of left middle cerebral artery ([place]) 04/17/2016  . Type 2 diabetes mellitus with circulatory disorder ([place]) 01/14/2016  . On amiodarone therapy 03/10/2015  . Stroke ([place]) 12/31/2014  . Dysarthria 12/30/2014  . Chronic systolic heart failure ([place]) 12/28/2014  . Coronary artery disease involving coronary bypass graft of native heart with angina pectoris ([place]) 02/02/2014  . Paroxysmal atrial fibrillation ([place]) 02/02/2014  . Essential hypertension 02/02/2014  . Chronic anticoagulation 02/02/2014    Past Surgical History:  Procedure Laterality Date  . CATARACT EXTRACTION  2006 and 2007   bilateral  . COLON SURGERY    . COLONOSCOPY    . CORONARY ARTERY BYPASS GRAFT         Family History  Problem Relation Age of Onset  . Angina Mother   . Diabetes Father   . Coronary artery disease Father   . Stroke Father     Social History    Tobacco Use  . Smoking status: Former Research scientist (life sciences)  . Smokeless tobacco: Never Used  Vaping Use  . Vaping Use: Never used  Substance Use Topics  . Alcohol use: Yes    Alcohol/week: 3.0 standard drinks    Types: 1 Glasses of wine, 1 Cans of beer, 1 Shots of liquor per week    Comment: social  . Drug use: Yes    Types: Marijuana    Comment: frequently    Home Medications Prior to Admission medications   Medication Sig Start Date End Date Taking? Authorizing Provider  amiodarone (PACERONE) 200 MG tablet Take 0.5 tablets (100 mg total) by mouth daily. 11/02/19   Belva Crome, MD  buPROPion (WELLBUTRIN XL) 150 MG 24 hr tablet Take 150 mg by mouth daily. 10/29/14   [provider]  escitalopram (LEXAPRO) 20 MG tablet Take 20 mg by mouth daily.      [provider]  fluticasone (FLONASE) 50 MCG/ACT nasal spray Place 1 spray into both nostrils daily. 02/09/19   Waldon Merl, PA-C  levocetirizine (XYZAL) 5 MG tablet Take 5 mg by mouth daily. 11/12/18   [provider]  lisinopril (ZESTRIL) 20 MG tablet TAKE 1 TABLET DAILY 02/15/20   Belva Crome, MD  metFORMIN (GLUCOPHAGE-XR) 500 MG 24 hr tablet Take 1,000 mg by mouth 2 (two) times a day. 01/23/19  [provider]  metoprolol succinate (TOPROL-XL) 50 MG 24 hr tablet TAKE ONE AND ONE-HALF TABLETS DAILY WITH OR IMMEDIATELY FOLLOWING A MEAL 10/27/19   Belva Crome, MD  Omega-3-acid Ethyl Esters (LOVAZA PO) Take 1 tablet by mouth 2 (two) times daily.     [provider]  rosuvastatin (CRESTOR) 10 MG tablet Take 10 mg by mouth at bedtime.     [provider]  tamsulosin (FLOMAX) 0.4 MG CAPS capsule Take 0.4 mg by mouth daily.    [provider]  TOVIAZ 8 MG TB24 tablet Take 8 mg by mouth daily. 04/27/16   [provider]  TRESIBA FLEXTOUCH 200 UNIT/ML SOPN Inject 56 Units into the skin daily.  11/11/15   [provider]  XARELTO 20 MG TABS tablet TAKE 1 TABLET DAILY 02/29/20    Belva Crome, MD    Allergies    Patient has no known allergies.  Review of Systems   Review of Systems  All other systems reviewed and are negative.   Physical Exam Updated Vital Signs BP (!) 181/76 (BP Location: Right Arm)   Pulse (!) 51   Temp 98.2 F (36.8 C) (Oral)   Resp 16   SpO2 100%   Physical Exam Vitals and nursing note reviewed.  Constitutional:      General: He is not in acute distress.    Appearance: He is well-developed. He is not ill-appearing.  HENT:     Head: Normocephalic and atraumatic.  Eyes:     Conjunctiva/sclera: Conjunctivae normal.  Cardiovascular:     Rate and Rhythm: Normal rate.  Pulmonary:     Effort: Pulmonary effort is normal. No respiratory distress.  Abdominal:     General: There is no distension.  Musculoskeletal:     Cervical back: Neck supple.     Comments: Moves all extremities  Skin:    General: Skin is warm and dry.     Comments: Fingertip avulsion injury secondary to mandolin slicer to the left index finger and left thumb  Neurological:     Mental Status: He is alert and oriented to person, place, and time.  Psychiatric:        Mood and Affect: Mood normal.        Behavior: Behavior normal.     ED Results / Procedures / Treatments   Labs (all labs ordered are listed, but only abnormal results are displayed) Labs Reviewed  CBG MONITORING, ED    EKG None  Radiology No results found.  Procedures Procedures (including critical care time)  Medications Ordered in ED Medications - No data to display  ED Course  I have reviewed the triage vital signs and the nursing notes.  Pertinent labs & imaging results that were available during my care of the patient were reviewed by me and considered in my medical decision making (see chart for details).    MDM Rules/Calculators/A&P                          Patient sustained an avulsion injury to his left index and left thumb. This was secondary to using a mandolin  slicer. The wound to the left index finger cannot be repaired given the absence of viable tissue. Quick clot dressing was applied and the wound was wrapped with a bulky gauze dressing. Hemostasis was achieved. Patient is advised to keep the dressing on for the next day or 2. He is given materials for dressing  changes. The wound will have to heal by secondary intention. He is advised to follow-up with his regular doctor if not improving. Return precautions discussed.   Final Clinical Impression(s) / ED Diagnoses Final diagnoses:  Avulsion of finger tip, initial encounter    Rx / DC Orders ED Discharge Orders    None       Montine Circle, PA-C 04/09/20 [place], Delice Bison, DO 04/09/20 [phone]

## 2020-04-20 ENCOUNTER — Other Ambulatory Visit: Payer: Self-pay | Admitting: Physician Assistant

## 2020-04-20 DIAGNOSIS — R109 Unspecified abdominal pain: Secondary | ICD-10-CM

## 2020-04-24 ENCOUNTER — Other Ambulatory Visit: Payer: Self-pay | Admitting: Interventional Cardiology

## 2020-04-28 ENCOUNTER — Ambulatory Visit
Admission: RE | Admit: 2020-04-28 | Discharge: 2020-04-28 | Disposition: A | Discharge status: Home / Self Care | Payer: Medicare HMO | Source: Ambulatory Visit | Attending: Physician Assistant | Admitting: Physician Assistant

## 2020-04-28 DIAGNOSIS — R109 Unspecified abdominal pain: Secondary | ICD-10-CM

## 2020-04-28 MED ORDER — IOPAMIDOL (ISOVUE-300) INJECTION 61%
100.0000 mL | Freq: Once | INTRAVENOUS | Status: AC | PRN
  Administered 2020-04-28: 100 mL via INTRAVENOUS

## 2020-05-17 NOTE — Progress Notes (Signed)
Cardiology Office Note:    Date:  05/18/2020   ID:  Walter [name], DOB December 17, 1945, MRN [id number]  PCP:  Lavone Orn, MD  Cardiologist:  Sinclair Grooms, MD   Referring MD: Lavone Orn, MD   Chief Complaint  Patient presents with  . Coronary Artery Disease  . Atrial Fibrillation    History of Present Illness:    Walter [name] is a 74 y.o. male with a hx of  paroxysmal atrial fibrillation, CAD with prior CABG, hypertension, CKD 2, chronic anticoagulation therapy,prior embolic CVA,and amiodarone therapy.  No major complaints.  No angina whatsoever.  Occasional fluttering in his chest.  Some discrepancy about the dose of amiodarone.  The med record says that he is on 200 mg tablets, 1/2 tablet/day.  He will let us know the exact tablet size.  A monitor performed in January revealed less than 1% burden of atrial fibrillation.  What ever dose of amiodarone he is on currently will be continued.  He has not had bleeding or blood in the urine or stool.  Past Medical History:  Diagnosis Date  . Abdominal pain   . Cancer [facility])    prostate  . Chronic kidney disease   . Colon polyp   . Diabetes mellitus   . Gum disease   . Pleural effusion   . Stroke [facility])     Past Surgical History:  Procedure Laterality Date  . CATARACT EXTRACTION  2006 and 2007   bilateral  . COLON SURGERY    . COLONOSCOPY    . CORONARY ARTERY BYPASS GRAFT      Current Medications: Current Meds  Medication Sig  . amiodarone (PACERONE) 200 MG tablet Take 0.5 tablets (100 mg total) by mouth daily. (Patient taking differently: Take 200 mg by mouth daily. )  . buPROPion (WELLBUTRIN XL) 150 MG 24 hr tablet Take 150 mg by mouth daily.  Marland Kitchen escitalopram (LEXAPRO) 20 MG tablet Take 20 mg by mouth daily.    . fluticasone (FLONASE) 50 MCG/ACT nasal spray Place 1 spray into both nostrils daily.  Marland Kitchen levocetirizine (XYZAL) 5 MG tablet Take 5 mg by mouth daily.  Marland Kitchen lisinopril (ZESTRIL) 20 MG tablet TAKE 1 TABLET DAILY    . metFORMIN (GLUCOPHAGE-XR) 500 MG 24 hr tablet Take 1,000 mg by mouth 2 (two) times a day.  . metoprolol succinate (TOPROL-XL) 50 MG 24 hr tablet TAKE ONE AND ONE-HALF TABLETS DAILY WITH OR IMMEDIATELY FOLLOWING A MEAL  . Omega-3-acid Ethyl Esters (LOVAZA PO) Take 1 tablet by mouth 2 (two) times daily.   . rosuvastatin (CRESTOR) 10 MG tablet Take 10 mg by mouth at bedtime.   . tamsulosin (FLOMAX) 0.4 MG CAPS capsule Take 0.4 mg by mouth daily.  . TOVIAZ 8 MG TB24 tablet Take 8 mg by mouth daily.  . TRESIBA FLEXTOUCH 200 UNIT/ML SOPN Inject 56 Units into the skin daily.   Alveda Reasons 20 MG TABS tablet TAKE 1 TABLET DAILY     Allergies:   Short ragweed pollen ext   Social History   Socioeconomic History  . Marital status: Married    Spouse name: Not on file  . Number of children: Not on file  . Years of education: Not on file  . Highest education level: Not on file  Occupational History  . Not on file  Tobacco Use  . Smoking status: Former Smoker    Types: E-cigarettes  . Smokeless tobacco: Never Used  Vaping Use  . Vaping Use: Never used  Substance and Sexual Activity  . Alcohol use: Yes    Alcohol/week: 3.0 standard drinks    Types: 1 Glasses of wine, 1 Cans of beer, 1 Shots of liquor per week    Comment: social  . Drug use: Yes    Types: Marijuana    Comment: frequently  . Sexual activity: Not on file  Other Topics Concern  . Not on file  Social History Narrative  . Not on file   Social Determinants of Health   Financial Resource Strain:   . Difficulty of Paying Living Expenses:   Food Insecurity:   . Worried About Charity fundraiser in the Last Year:   . Arboriculturist in the Last Year:   Transportation Needs:   . Film/video editor (Medical):   Marland Kitchen Lack of Transportation (Non-Medical):   Physical Activity:   . Days of Exercise per Week:   . Minutes of Exercise per Session:   Stress:   . Feeling of Stress :   Social Connections:   . Frequency of  Communication with Friends and Family:   . Frequency of Social Gatherings with Friends and Family:   . Attends Religious Services:   . Active Member of Clubs or Organizations:   . Attends Archivist Meetings:   Marland Kitchen Marital Status:      Family History: The patient's family history includes Angina in his mother; Coronary artery disease in his father; Diabetes in his father; Stroke in his father.  ROS:   Please see the history of present illness.    No complaints.  Occasional palpitations.  All other systems reviewed and are negative.  EKGs/Labs/Other Studies Reviewed:    The following studies were reviewed today:  Prolonged monitor January 2021: Study Highlights   NSR and SB  Paroxysmal atrial fibrillation, < 1% burden.  No ventricular ectopy.    EKG:  EKG not repeated  Recent Labs: No results found for requested labs within last 8760 hours.  Recent Lipid Panel    Component Value Date/Time   CHOL 109 02/26/2019 0824   TRIG 126 02/26/2019 0824   HDL 51 02/26/2019 0824   CHOLHDL 2.1 02/26/2019 0824   CHOLHDL 3.5 12/31/2014 0555   VLDL 42 (H) 12/31/2014 0555   LDLCALC 33 02/26/2019 0824    Physical Exam:    VS:  BP 118/62   Pulse 60   Ht 5\' 7"  (1.702 m)   Wt 202 lb (91.6 kg)   BMI 31.64 kg/m     Wt Readings from Last 3 Encounters:  05/18/20 202 lb (91.6 kg)  11/02/19 206 lb 9.6 oz (93.7 kg)  02/11/19 202 lb (91.6 kg)     GEN: Moderate obesity. No acute distress HEENT: Normal NECK: No JVD. LYMPHATICS: No lymphadenopathy CARDIAC:  RRR without murmur, gallop, or edema. VASCULAR:  Normal Pulses. No bruits. RESPIRATORY:  Clear to auscultation without rales, wheezing or rhonchi  ABDOMEN: Soft, non-tender, non-distended, No pulsatile mass, MUSCULOSKELETAL: No deformity  SKIN: Warm and dry NEUROLOGIC:  Alert and oriented x 3 PSYCHIATRIC:  Normal affect   ASSESSMENT:    1. Coronary artery disease involving coronary bypass graft of native heart  with angina pectoris ([place])   2. Chronic systolic heart failure (HCC)   3. Paroxysmal atrial fibrillation ([place])   4. Chronic anticoagulation   5. Essential hypertension   6. OSA on CPAP   7. Type 2 diabetes mellitus with other circulatory complication, without long-term current use of insulin (HCC)  8. On amiodarone therapy   9. Educated about COVID-19 virus infection    PLAN:    In order of problems listed above:  1. Stable.  Secondary prevention reviewed. 2. Continue current therapy with Toprol-XL 50 mg in the morning 25 mg in the evening, lisinopril 20 mg/day, and low-salt diet. 3. Continue amiodarone at the current dose.  Liver panel and TSH on return. 4. Continue Xarelto and observe for bleeding. 5. Excellent blood pressure control.  Continue same therapy. 6. CPAP is being used compliantly. 7. Consider adding an SGLT2.  Since he is on insulin, will leave this to his primary care. 8. TSH and liver panel on return in 6 months 9. He has been vaccinated for COVID-19.  Overall education and awareness concerning primary/secondary risk prevention was discussed in detail: LDL less than 70, hemoglobin A1c less than 7, blood pressure target less than 130/80 mmHg, >150 minutes of moderate aerobic activity per week, avoidance of smoking, weight control (via diet and exercise), and continued surveillance/management of/for obstructive sleep apnea.    Medication Adjustments/Labs and Tests Ordered: Current medicines are reviewed at length with the patient today.  Concerns regarding medicines are outlined above.  No orders of the defined types were placed in this encounter.  No orders of the defined types were placed in this encounter.   There are no Patient Instructions on file for this visit.   Signed, Sinclair Grooms, MD  05/18/2020 9:30 AM    [place]

## 2020-05-18 ENCOUNTER — Ambulatory Visit (INDEPENDENT_AMBULATORY_CARE_PROVIDER_SITE_OTHER): Payer: Medicare HMO | Admitting: Interventional Cardiology

## 2020-05-18 ENCOUNTER — Other Ambulatory Visit: Payer: Self-pay

## 2020-05-18 ENCOUNTER — Encounter: Payer: Self-pay | Admitting: Interventional Cardiology

## 2020-05-18 VITALS — BP 118/62 | HR 60 | Ht 67.0 in | Wt 202.0 lb

## 2020-05-18 DIAGNOSIS — E1159 Type 2 diabetes mellitus with other circulatory complications: Secondary | ICD-10-CM

## 2020-05-18 DIAGNOSIS — I10 Essential (primary) hypertension: Secondary | ICD-10-CM

## 2020-05-18 DIAGNOSIS — G4733 Obstructive sleep apnea (adult) (pediatric): Secondary | ICD-10-CM

## 2020-05-18 DIAGNOSIS — Z9989 Dependence on other enabling machines and devices: Secondary | ICD-10-CM

## 2020-05-18 DIAGNOSIS — Z7189 Other specified counseling: Secondary | ICD-10-CM

## 2020-05-18 DIAGNOSIS — Z7901 Long term (current) use of anticoagulants: Secondary | ICD-10-CM | POA: Diagnosis not present

## 2020-05-18 DIAGNOSIS — I5022 Chronic systolic (congestive) heart failure: Secondary | ICD-10-CM

## 2020-05-18 DIAGNOSIS — I25709 Atherosclerosis of coronary artery bypass graft(s), unspecified, with unspecified angina pectoris: Secondary | ICD-10-CM

## 2020-05-18 DIAGNOSIS — Z79899 Other long term (current) drug therapy: Secondary | ICD-10-CM

## 2020-05-18 DIAGNOSIS — I480 Paroxysmal atrial fibrillation: Secondary | ICD-10-CM

## 2020-05-18 NOTE — Patient Instructions (Signed)

## 2020-06-06 ENCOUNTER — Ambulatory Visit: Payer: Medicare HMO | Admitting: Interventional Cardiology

## 2020-07-20 ENCOUNTER — Encounter: Payer: Self-pay | Admitting: Neurology

## 2020-08-29 ENCOUNTER — Other Ambulatory Visit: Payer: Self-pay | Admitting: Interventional Cardiology

## 2020-08-29 NOTE — Telephone Encounter (Signed)
Pt last saw Dr Tamala Julian 05/18/20, last labs 04/20/20 Creat 1.46 at [facility] per [facility], age 74, weight 91.6kg, CrCl 57.51, based on CrCl pt is on appropriate dosage of Xarelto 20mg  QD.  Will refill rx.

## 2020-09-20 ENCOUNTER — Encounter: Payer: Self-pay | Admitting: *Deleted

## 2020-09-23 ENCOUNTER — Ambulatory Visit (INDEPENDENT_AMBULATORY_CARE_PROVIDER_SITE_OTHER): Payer: Medicare HMO | Admitting: Diagnostic Neuroimaging

## 2020-09-23 ENCOUNTER — Encounter: Payer: Self-pay | Admitting: Diagnostic Neuroimaging

## 2020-09-23 VITALS — BP 115/60 | HR 54 | Ht 67.0 in | Wt 205.2 lb

## 2020-09-23 DIAGNOSIS — R55 Syncope and collapse: Secondary | ICD-10-CM | POA: Diagnosis not present

## 2020-09-23 DIAGNOSIS — R269 Unspecified abnormalities of gait and mobility: Secondary | ICD-10-CM

## 2020-09-23 NOTE — Patient Instructions (Signed)
  GENERAL BALANCE DIFFICULTY (since 2016; due to diabetic neuropathy and history of stroke) - refer to PT exercises  RECURRENT LIGHTHEADEDNESS / SYNCOPE (2 events since 2018) - follow up with PCP and cardiology

## 2020-09-23 NOTE — Progress Notes (Signed)
[facility]  PATIENT: Walter [name] DOB: 08-04-1946  REFERRING CLINICIAN: Lavone Orn, MD HISTORY FROM: patient  REASON FOR VISIT: new consult    HISTORICAL  CHIEF COMPLAINT:  Chief Complaint  Patient presents with   New Patient (Initial Visit)    RM 6, alone. Paper referral from Celene Kras (PCP) for Vertigo, difficulty with balance, parasthesias in the legs and numbness in feet and hands. Pt reports he is here for stroke follow up.     HISTORY OF PRESENT ILLNESS:   74 year old male here for evaluation of dizziness, numbness, balance and gait difficulty.  Patient had stroke in March 2016 due to left MCA infarct.  Patient had symptoms of balance and dizziness since that time.  Patient mentioned these to Dr. Erlinda Hong in stroke clinic in 2017.  Continues to have complaints of memory, dizziness, balance, weakness, especially when he stands up.  He feels like his balance is off.  Also patient has had 2 episodes of passing out in the last 3 years.  He is followed up with cardiology with his.   REVIEW OF SYSTEMS: Full 14 system review of systems performed and negative with exception of: as per HPI.  ALLERGIES: Allergies  Allergen Reactions   Byetta 10 Mcg Pen [Exenatide] Nausea Only   Invokana [Canagliflozin] Other (See Comments)    Orthostatic hypotension   Levemir [Insulin Detemir] Other (See Comments)    Burning at injection site   Nsaids    Short Ragweed Pollen Ext Cough    HOME MEDICATIONS: Outpatient Medications Prior to Visit  Medication Sig Dispense Refill   acetaminophen (TYLENOL) 325 MG tablet Take 650 mg by mouth every 6 (six) hours as needed.     amiodarone (PACERONE) 200 MG tablet Take 0.5 tablets (100 mg total) by mouth daily. (Patient taking differently: Take 200 mg by mouth daily.) 45 tablet 3   buPROPion (WELLBUTRIN XL) 150 MG 24 hr tablet Take 150 mg by mouth daily.  0   calcium carbonate (TUMS - DOSED IN MG ELEMENTAL CALCIUM) 500  MG chewable tablet Chew 1 tablet by mouth daily.     Continuous Blood Gluc Receiver (Ariton) DEVI by Does not apply route.     Continuous Blood Gluc Sensor (DEXCOM G6 SENSOR) MISC by Does not apply route.     dapagliflozin propanediol (FARXIGA) 10 MG TABS tablet Take by mouth daily.     escitalopram (LEXAPRO) 20 MG tablet Take 20 mg by mouth daily.     esomeprazole (NEXIUM) 40 MG capsule Take 40 mg by mouth daily at 12 noon.     Ferrous Sulfate (IRON PO) Take 1 tablet by mouth daily.     levocetirizine (XYZAL) 5 MG tablet Take 5 mg by mouth daily.     lisinopril (ZESTRIL) 20 MG tablet TAKE 1 TABLET DAILY 90 tablet 3   metoprolol succinate (TOPROL-XL) 50 MG 24 hr tablet TAKE ONE AND ONE-HALF TABLETS DAILY WITH OR IMMEDIATELY FOLLOWING A MEAL 135 tablet 3   Omega-3-acid Ethyl Esters (LOVAZA PO) Take 1 tablet by mouth 2 (two) times daily.     polyethylene glycol (MIRALAX / GLYCOLAX) 17 g packet Take 17 g by mouth daily.     rosuvastatin (CRESTOR) 10 MG tablet Take 10 mg by mouth at bedtime.     tamsulosin (FLOMAX) 0.4 MG CAPS capsule Take 0.4 mg by mouth daily.     TOVIAZ 8 MG TB24 tablet Take 8 mg by mouth daily.     TRESIBA FLEXTOUCH  200 UNIT/ML SOPN Inject 80 Units into the skin daily.     XARELTO 20 MG TABS tablet TAKE 1 TABLET DAILY 90 tablet 1   fluticasone (FLONASE) 50 MCG/ACT nasal spray Place 1 spray into both nostrils daily. 16 g 0   metFORMIN (GLUCOPHAGE-XR) 500 MG 24 hr tablet Take 1,000 mg by mouth 2 (two) times a day.     No facility-administered medications prior to visit.    PAST MEDICAL HISTORY: Past Medical History:  Diagnosis Date   Abdominal pain    Atrial fibrillation (HCC)    Barrett's esophagus    CAD (coronary artery disease)    Cancer (HCC)    prostate   Chronic kidney disease    Colon polyp    CVA (cerebral vascular accident) ([place]) 12/2014   Diabetes mellitus    w/neuropathy   Gum disease    Hiatal hernia     Hypertension    Kidney stone    hx of   Numbness    hands, feet   OSA on CPAP    Paresthesia of bilateral legs    Pleural effusion    Prostate cancer ([place]) 2007   radiation tx [place]   PTSD (post-traumatic stress disorder)    Stroke ([place])    Vertigo     PAST SURGICAL HISTORY: Past Surgical History:  Procedure Laterality Date   CATARACT EXTRACTION  2006 and 2007   bilateral   COLON SURGERY  05/2011   hemicolectomy   COLONOSCOPY     CORONARY ARTERY BYPASS GRAFT  2004   x3    FAMILY HISTORY: Family History  Problem Relation Age of Onset   Angina Mother    Diabetes Father    Coronary artery disease Father    Stroke Father     SOCIAL HISTORY: Social History   Socioeconomic History   Marital status: Married    Spouse name: Public affairs consultant   Number of children: 2   Years of education: BFA   Highest education level: Not on file  Occupational History   Occupation: Retired     Comment: retired Engineer, technical sales  Tobacco Use   Smoking status: Former Smoker    Years: 26.00    Types: E-cigarettes   Smokeless tobacco: Never Used  Scientific laboratory technician Use: Never used  Substance and Sexual Activity   Alcohol use: Yes    Alcohol/week: 3.0 standard drinks    Types: 1 Glasses of wine, 1 Cans of beer, 1 Shots of liquor per week    Comment: social   Drug use: Yes    Types: Marijuana    Comment: frequently--THC   Sexual activity: Not on file  Other Topics Concern   Not on file  Social History Narrative   Left handed    Lives with wife   Caffeine -2 cups per day   Social Determinants of Health   Financial Resource Strain: Not on file  Food Insecurity: Not on file  Transportation Needs: Not on file  Physical Activity: Not on file  Stress: Not on file  Social Connections: Not on file  Intimate Partner Violence: Not on file     PHYSICAL EXAM  GENERAL EXAM/CONSTITUTIONAL: Vitals:  Vitals:   09/23/20 0815  BP: 115/60  Pulse: (!) 54  SpO2: 96%   Weight: 205 lb 3.2 oz (93.1 kg)  Height: 5\' 7"  (1.702 m)     Body mass index is 32.14 kg/m. Wt Readings from Last 3 Encounters:  09/23/20 205 lb 3.2 oz (  93.1 kg)  05/18/20 202 lb (91.6 kg)  11/02/19 206 lb 9.6 oz (93.7 kg)     Patient is in no distress; well developed, nourished and groomed; neck is supple  CARDIOVASCULAR:  Examination of carotid arteries is normal; no carotid bruits  Regular rate and rhythm, no murmurs  Examination of peripheral vascular system by observation and palpation is normal  EYES:  Ophthalmoscopic exam of optic discs and posterior segments is normal; no papilledema or hemorrhages  No exam data present  MUSCULOSKELETAL:  Gait, strength, tone, movements noted in Neurologic exam below  NEUROLOGIC: MENTAL STATUS:  No flowsheet data found.  awake, alert, oriented to person, place and time  recent and remote memory intact  normal attention and concentration  language fluent, comprehension intact, naming intact  fund of knowledge appropriate  CRANIAL NERVE:   2nd - no papilledema on fundoscopic exam  2nd, 3rd, 4th, 6th - pupils equal and reactive to light, visual fields full to confrontation, extraocular muscles intact, no nystagmus  5th - facial sensation symmetric  7th - facial strength symmetric  8th - hearing intact  9th - palate elevates symmetrically, uvula midline  11th - shoulder shrug symmetric  12th - tongue protrusion midline  MOTOR:   normal bulk and tone, full strength in the BUE, BLE  SENSORY:   normal and symmetric to light touch, temperature, vibration  COORDINATION:   finger-nose-finger, fine finger movements normal  REFLEXES:   deep tendon reflexes TRACE and symmetric  GAIT/STATION:   narrow based gait     DIAGNOSTIC DATA (LABS, IMAGING, TESTING) - I reviewed patient records, labs, notes, testing and imaging myself where available.  Lab Results  Component Value Date   WBC 6.5  02/26/2019   HGB 13.5 02/26/2019   HCT 38.9 02/26/2019   MCV 91 02/26/2019   PLT 244 02/26/2019      Component Value Date/Time   NA 138 02/26/2019 0824   K 4.7 02/26/2019 0824   CL 100 02/26/2019 0824   CO2 24 02/26/2019 0824   GLUCOSE 117 (H) 02/26/2019 0824   GLUCOSE 130 (H) 07/19/2016 1015   BUN 23 02/26/2019 0824   CREATININE 1.49 (H) 02/26/2019 0824   CREATININE 1.34 (H) 07/19/2016 1015   CALCIUM 9.7 02/26/2019 0824   PROT 6.5 02/26/2019 0824   ALBUMIN 4.1 02/26/2019 0824   AST 52 (H) 02/26/2019 0824   ALT 63 (H) 02/26/2019 0824   ALKPHOS 48 02/26/2019 0824   BILITOT 0.6 02/26/2019 0824   GFRNONAA 46 (L) 02/26/2019 0824   GFRAA 53 (L) 02/26/2019 0824   Lab Results  Component Value Date   CHOL 109 02/26/2019   HDL 51 02/26/2019   LDLCALC 33 02/26/2019   TRIG 126 02/26/2019   CHOLHDL 2.1 02/26/2019   Lab Results  Component Value Date   HGBA1C 7.1 (H) 02/26/2019   Lab Results  Component Value Date   KWIOXBDZ32 992 12/31/2014   Lab Results  Component Value Date   TSH 6.740 (H) 03/31/2019    12/31/14 MRI brain [I reviewed images myself and agree with interpretation. -[facility]]  1. Acute/subacute cortical infarcts involving the left frontal operculum and to lesser extent the left precentral gyrus. 2. Mild atrophy and white matter changes otherwise reflect the sequelae of chronic microvascular ischemia.    ASSESSMENT AND PLAN  74 y.o. year old male here with:  Dx:  1. Gait difficulty   2. Recurrent syncope      PLAN:  GENERAL BALANCE DIFFICULTY (since 2016;  due to diabetic neuropathy and history of stroke in 2016) - refer to PT exercises  RECURRENT LIGHTHEADEDNESS / SYNCOPE (2 events since 2018) - follow up with PCP and cardiology   Orders Placed This Encounter  Procedures   Ambulatory referral to Physical Therapy   Return for return to PCP.    [name], MD 77/93/9688, 6:48 AM Certified in Neurology, Neurophysiology and  Neuroimaging  [facility] [address] [place], [place] [address] [phone]

## 2020-10-21 ENCOUNTER — Ambulatory Visit: Payer: Medicare HMO | Admitting: Neurology

## 2020-10-26 ENCOUNTER — Other Ambulatory Visit: Payer: Self-pay

## 2020-10-26 MED ORDER — AMIODARONE HCL 200 MG PO TABS: 100.0000 mg | ORAL_TABLET | Freq: Every day | ORAL | 2 refills | Status: DC

## 2020-10-28 ENCOUNTER — Other Ambulatory Visit: Payer: Self-pay

## 2020-10-28 ENCOUNTER — Ambulatory Visit: Payer: Medicare HMO | Attending: Diagnostic Neuroimaging | Admitting: Physical Therapy

## 2020-10-28 ENCOUNTER — Encounter: Payer: Self-pay | Admitting: Physical Therapy

## 2020-10-28 DIAGNOSIS — R2681 Unsteadiness on feet: Secondary | ICD-10-CM

## 2020-10-28 DIAGNOSIS — R2689 Other abnormalities of gait and mobility: Secondary | ICD-10-CM | POA: Diagnosis present

## 2020-10-28 DIAGNOSIS — R42 Dizziness and giddiness: Secondary | ICD-10-CM

## 2020-10-28 NOTE — Therapy (Signed)
[facility] [address] [place], [place], [address] Phone: [phone]   Fax:  [phone]  Physical Therapy Evaluation  Patient Details  Name: Walter [name] MRN: [id number] Date of Birth: October 03, 1946 Referring Provider (PT): Suanne Marker, MD   Encounter Date: 10/28/2020   PT End of Session - 10/28/20 1257    Visit Number 1    Number of Visits 9    Date for PT Re-Evaluation 12/27/20   written for 30 day POC   Authorization Type Aetna Medicare    PT Start Time 1101    PT Stop Time 1147    PT Time Calculation (min) 46 min    Equipment Utilized During Treatment Gait belt    Activity Tolerance Patient tolerated treatment well    Behavior During Therapy [facility] for tasks assessed/performed           Past Medical History:  Diagnosis Date   Abdominal pain    Atrial fibrillation (HCC)    Barrett's esophagus    CAD (coronary artery disease)    Cancer (HCC)    prostate   Chronic kidney disease    Colon polyp    CVA (cerebral vascular accident) (HCC) 12/2014   Diabetes mellitus    w/neuropathy   Gum disease    Hiatal hernia    Hypertension    Kidney stone    hx of   Numbness    hands, feet   OSA on CPAP    Paresthesia of bilateral legs    Pleural effusion    Prostate cancer (HCC) 2007   radiation tx [place]   PTSD (post-traumatic stress disorder)    Stroke [facility])    Vertigo     Past Surgical History:  Procedure Laterality Date   CATARACT EXTRACTION  2006 and 2007   bilateral   COLON SURGERY  05/2011   hemicolectomy   COLONOSCOPY     CORONARY ARTERY BYPASS GRAFT  2004   x3    There were no vitals filed for this visit.    Subjective Assessment - 10/28/20 1105    Subjective Patient had stroke in March 2016 due to left MCA infarct.  Patient had symptoms of balance and intermittent dizziness since that time.  [place] [employer] [place] - had stress fx from vertebrae  and has had back pain since that comes and goes and reports that really affects how he moves and his balance. Also has neuropathy. Reports he walks into walls and doorways every once in a while. No falls.    Pertinent History diabetes mellitus w/ neuropathy, CVA (12/2014), prostate cancer, vertigo, HTN, CKD, a fib, CABG x3 (2004). 2 episodes of passing out in the last 3 years (being followed by cardiology for this)    Limitations Standing;Walking    Patient Stated Goals find out what he needs to do to improve his balance    Currently in Pain? No/denies   "something always hurts, but not a lot"             [facility] PT Assessment - 10/28/20 1114      Assessment   Medical Diagnosis gait abnormality/imbalance    Referring Provider (PT) Marjory Lies, Glenford Bayley, MD    Onset Date/Surgical Date 09/23/20   date of referral, has had balance difficulties since CVA in 2016   Hand Dominance Left    Prior Therapy previous PT in 2017 for CVA      Precautions   Precautions  Fall      Balance Screen   Has the patient fallen in the past 6 months No    Has the patient had a decrease in activity level because of a fear of falling?  No    Is the patient reluctant to leave their home because of a fear of falling?  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, video editing      Sensation   Light Touch Appears Intact    Additional Comments hx of neuropathy       Coordination   Gross Motor Movements are Fluid and Coordinated Yes      ROM / Strength   AROM / PROM / Strength Strength      Strength   Strength Assessment Site Hip;Knee;Ankle    Right/Left Hip Right;Left    Right Hip Flexion 4/5    Left Hip Flexion 4+/5    Right/Left Knee Right;Left    Right Knee Flexion 5/5    Right Knee Extension 5/5    Left Knee Flexion 5/5    Left Knee Extension 5/5    Right/Left Ankle Right;Left    Right Ankle Dorsiflexion 5/5    Left Ankle Dorsiflexion 5/5      Transfers   Transfers Sit to Stand;Stand to Sit    Sit to Stand 5: Supervision;Without upper extremity assist    Five time sit to stand comments  9.47   2 episodes of posterior BLE bracing against mat   Stand to Sit 5: Supervision;Without upper extremity assist    Comments difficulty getting up off lower surfaces and esp the couch      Ambulation/Gait   Ambulation/Gait Yes    Ambulation/Gait Assistance 5: Supervision    Ambulation/Gait Assistance Details slightly veering to L    Ambulation Distance (Feet) --   clinic distances   Assistive device None    Gait Pattern Step-through pattern    Ambulation Surface Level;Indoor    Gait velocity 6.45 seconds in 20 ft = 3.1 ft/sec    Stairs Yes    Stairs Assistance 5: Supervision    Stair Management Technique One rail Right;Alternating pattern;Forwards    Number of Stairs 4    Height of Stairs 6      High Level Balance   High Level Balance Comments mCTSIB: conditions 1-3: 30 seconds, condition 4: 17 seconds with mild/moderate postural sway. SLS: 2 seconds both legs      Functional Gait  Assessment   Gait assessed  Yes    Gait Level Surface Walks 20 ft in less than 7 sec but greater than 5.5 sec, uses assistive device, slower speed, mild gait deviations, or deviates 6-10 in outside of the 12 in walkway width.   6.45   Change in Gait Speed Able to change speed, demonstrates mild gait  deviations, deviates 6-10 in outside of the 12 in walkway width,  or no gait deviations, unable to achieve a major change in velocity, or uses a change in velocity, or uses an assistive device.    Gait with Horizontal Head Turns Performs head turns smoothly with slight change in gait velocity (eg, minor disruption to smooth gait path), deviates 6-10 in outside 12 in walkway width, or uses an assistive device.    Gait with Vertical Head Turns Performs task with slight change in gait velocity (eg, minor disruption to smooth gait path), deviates 6 - 10 in outside 12 in walkway width or uses assistive device    Gait and Pivot Turn Pivot turns safely within 3 sec and stops quickly with no loss of balance.    Step Over Obstacle Is able to step over 2 stacked shoe boxes taped together (9 in total height) without changing gait speed. No evidence of imbalance.    Gait with Narrow Base of Support Ambulates less than 4 steps heel to toe or cannot perform without assistance.    Gait with Eyes Closed Walks 20 ft, slow speed, abnormal gait pattern, evidence for imbalance, deviates 10-15 in outside 12 in walkway width. Requires more than 9 sec to ambulate 20 ft.   10.15   Ambulating Backwards Walks 20 ft, uses assistive device, slower speed, mild gait deviations, deviates 6-10 in outside 12 in walkway width.   18 seconds   Steps Alternating feet, must use rail.    Total Score 19    FGA comment: 19/30                      Objective measurements completed on examination: See above findings.               PT Education - 10/28/20 1257    Education Details clinical findings, POC    Person(s) Educated Patient    Methods Explanation    Comprehension Verbalized understanding            PT Short Term Goals - 10/28/20 1303      PT SHORT TERM GOAL #1   Title ALL STGS = LTGS             PT Long Term Goals - 10/28/20 1303      PT LONG TERM GOAL #1   Title Pt will independently perform HEP to maximize functional gains made in PT.  ALL LTGS DUE  11/25/20    Time 4    Period Weeks    Status New    Target Date 11/25/20      PT LONG TERM GOAL #2   Title Pt will improve FGA score to at least a 24/30 in order to demo decr fall risk.    Baseline 19/30    Time 4    Period Weeks    Status New      PT LONG TERM GOAL #3   Title Pt will undergo further assessment of SOT with LTG written as appropriate.    Time 4    Period Weeks    Status New      PT LONG TERM GOAL #4   Title Pt will improve condition 4 of mCTSIB to at least 30 seconds in order to demo improved vestibular input for balance.    Baseline 17 seconds    Time 4    Period Weeks    Status New  Plan - 10/28/20 1305    Clinical Impression Statement Patient is a 75 year old male referred to Neuro OPPT for imbalance/gait abnormality. Pt's PMH is significant for:  diabetes mellitus w/ neuropathy, CVA (12/2014), prostate cancer, vertigo, HTN, CKD, a fib. Per Dr. Gladstone Lighter note, reports pt having dizziness, but none reported during eval. The following deficits were present during the exam:  decr strength, impaired balance (decr vestibular input, SLS, tandem, head motions). Based on FGA pt is at an icr risk for falls. Based on condition 4 of mCTSIB, pt with decr vestibular input for balance. Pt would benefit from skilled PT to address these impairments and functional limitations to maximize functional mobility independence and decr fall risk.    Personal Factors and Comorbidities Comorbidity 3+;Time since onset of injury/illness/exacerbation;Past/Current Experience    Comorbidities diabetes mellitus w/ neuropathy, CVA (12/2014), prostate cancer, vertigo, HTN, CKD, a fib    Examination-Activity Limitations Stairs;Transfers;Locomotion Level    Examination-Participation Restrictions Community Activity    Stability/Clinical Decision Making Stable/Uncomplicated    Clinical Decision Making Low    Rehab Potential Excellent    PT Frequency 2x / week    PT Duration  4 weeks    PT Treatment/Interventions ADLs/Self Care Home Management;Gait training;Therapeutic activities;Functional mobility training;Stair training;Therapeutic exercise;Balance training;Neuromuscular re-education;Patient/family education;Vestibular    PT Next Visit Plan perform SOT - with LTG written as appropriate. initial HEP for balance - SLS and vestibular input for balance, tandem gait.    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 [place], [place], [address] Phone: [phone]   Fax:  [phone]  Name: Walter [name] MRN: [id number] Date of Birth: [birth year]

## 2020-11-02 ENCOUNTER — Other Ambulatory Visit: Payer: Self-pay

## 2020-11-02 ENCOUNTER — Ambulatory Visit: Payer: Medicare HMO

## 2020-11-02 DIAGNOSIS — R2681 Unsteadiness on feet: Secondary | ICD-10-CM | POA: Diagnosis not present

## 2020-11-02 DIAGNOSIS — R2689 Other abnormalities of gait and mobility: Secondary | ICD-10-CM

## 2020-11-02 DIAGNOSIS — R42 Dizziness and giddiness: Secondary | ICD-10-CM

## 2020-11-02 NOTE — Therapy (Signed)
[facility] [address] [place], [place], [address] Phone: [phone]   Fax:  [phone]  Physical Therapy Treatment  Patient Details  Name: Walter [name] MRN: [id number] Date of Birth: Dec 11, 1945 Referring Provider (PT): [name], MD   Encounter Date: 11/02/2020   PT End of Session - 11/02/20 1234    Visit Number 2    Number of Visits 9    Date for PT Re-Evaluation 12/27/20   written for 30 day POC   Authorization Type Aetna Medicare    PT Start Time 1230    PT Stop Time 1315    PT Time Calculation (min) 45 min    Equipment Utilized During Treatment Gait belt    Activity Tolerance Patient tolerated treatment well    Behavior During Therapy [facility] for tasks assessed/performed           Past Medical History:  Diagnosis Date  . Abdominal pain   . Atrial fibrillation ([place])   . Barrett's esophagus   . CAD (coronary artery disease)   . Cancer [facility])    prostate  . Chronic kidney disease   . Colon polyp   . CVA (cerebral vascular accident) ([place]) 12/2014  . Diabetes mellitus    w/neuropathy  . Gum disease   . Hiatal hernia   . Hypertension   . Kidney stone    hx of  . Numbness    hands, feet  . OSA on CPAP   . Paresthesia of bilateral legs   . Pleural effusion   . Prostate cancer [facility]) 2007   radiation tx [place]  . PTSD (post-traumatic stress disorder)   . Stroke ([place])   . Vertigo     Past Surgical History:  Procedure Laterality Date  . CATARACT EXTRACTION  2006 and 2007   bilateral  . COLON SURGERY  05/2011   hemicolectomy  . COLONOSCOPY    . CORONARY ARTERY BYPASS GRAFT  2004   x3    There were no vitals filed for this visit.   Subjective Assessment - 11/02/20 1233    Subjective Patient reports no new changes. No falls.    Pertinent History diabetes mellitus w/ neuropathy, CVA (12/2014), prostate cancer, vertigo, HTN, CKD, a fib, CABG x3 (2004). 2 episodes of passing out in the last  3 years (being followed by cardiology for this)    Limitations Standing;Walking    Patient Stated Goals find out what he needs to do to improve his balance    Currently in Pain? No/denies              Neuro re-ed: sensory organization test performed with following results: Conditions: 1: all above age related norms 2: all above age related norms 3: all below age related norms  4: first trial below norm, second and third trial above age related norm 5: first two trials below age related norm, first above age related norm 6: fall on first trial, above age related norms on second trials.  Composite score: 56% (below age related norm) Sensory Analysis Som: above age related norm Vis: above age related norm Vest: below age related norm (57) Pref: above age related norm Strategy analysis: appropriate use of hip/ankle strategy       COG alignment: patient tend to keep COG more LLE > RLE       [facility] Adult PT Treatment/Exercise - 11/02/20 0001      Neuro Re-ed    Neuro Re-ed Details  Initiated Balance  HEP. Completed narrow BOS, with horizontal/vertical head turns 2 x 10 reps. Provided HEP.            Initiated Balance HEP. Educated to complete in a corner for improved safety.  Access Code: [email or web address] URL: [email or web address] Date: 11/02/2020 Prepared by: Baldomero Lamy  Exercises Romberg Stance on Foam Pad with Head Rotation - 1 x daily - 5 x weekly - 2 sets - 10 reps Romberg Stance with Head Nods on Foam Pad - 1 x daily - 5 x weekly - 2 sets - 10 reps      PT Education - 11/02/20 1319    Education Details initial Balance HEP    Person(s) Educated Patient    Methods Explanation;Demonstration;Handout    Comprehension Verbalized understanding;Returned demonstration            PT Short Term Goals - 10/28/20 1303      PT SHORT TERM GOAL #1   Title ALL STGS = LTGS             PT Long Term Goals - 11/02/20 1330      PT LONG TERM GOAL #1    Title Pt will independently perform HEP to maximize functional gains made in PT.  ALL LTGS DUE 11/25/20    Time 4    Period Weeks    Status New      PT LONG TERM GOAL #2   Title Pt will improve FGA score to at least a 24/30 in order to demo decr fall risk.    Baseline 19/30    Time 4    Period Weeks    Status New      PT LONG TERM GOAL #3   Title Pt will improve vestibular to >/= 50% on SOT to demonstrate improved vestibular input for balance.    Baseline approx 40%    Time 4    Period Weeks    Status Revised      PT LONG TERM GOAL #4   Title Pt will improve condition 4 of mCTSIB to at least 30 seconds in order to demo improved vestibular input for balance.    Baseline 17 seconds    Time 4    Period Weeks    Status New                 Plan - 11/02/20 1327    Clinical Impression Statement Today's skilled PT session focused on further balance assesment with Sensory Organization Test. Patient demonstrated decreased vestibular input (approx 40%, which is below age related norms) into balance. initiated balance HEP focused on activites to promote vestibular input. Will continue to progress toward all goals.    Personal Factors and Comorbidities Comorbidity 3+;Time since onset of injury/illness/exacerbation;Past/Current Experience    Comorbidities diabetes mellitus w/ neuropathy, CVA (12/2014), prostate cancer, vertigo, HTN, CKD, a fib    Examination-Activity Limitations Stairs;Transfers;Locomotion Level    Examination-Participation Restrictions Community Activity    Stability/Clinical Decision Making Stable/Uncomplicated    Rehab Potential Excellent    PT Frequency 2x / week    PT Duration 4 weeks    PT Treatment/Interventions ADLs/Self Care Home Management;Gait training;Therapeutic activities;Functional mobility training;Stair training;Therapeutic exercise;Balance training;Neuromuscular re-education;Patient/family education;Vestibular    PT Next Visit Plan how was iniital  exercises? continue to add balance to HEP - SLS and vestibular input for balance, tandem gait.    Consulted and Agree with Plan of Care Patient           Patient will benefit from  skilled therapeutic intervention in order to improve the following deficits and impairments:  Abnormal gait,Decreased balance,Difficulty walking,Decreased strength,Dizziness  Visit Diagnosis: Unsteadiness on feet  Other abnormalities of gait and mobility  Dizziness and giddiness     Problem List Patient Active Problem List   Diagnosis Date Noted  . Obstructive sleep apnea 06/20/2017  . Hyperlipidemia 04/17/2016  . Cerebrovascular accident (CVA) due to embolism of left middle cerebral artery ([place]) 04/17/2016  . Type 2 diabetes mellitus with circulatory disorder ([place]) 01/14/2016  . On amiodarone therapy 03/10/2015  . Stroke ([place]) 12/31/2014  . Dysarthria 12/30/2014  . Chronic systolic heart failure ([place]) 12/28/2014  . Coronary artery disease involving coronary bypass graft of native heart with angina pectoris ([place]) 02/02/2014  . Paroxysmal atrial fibrillation ([place]) 02/02/2014  . Essential hypertension 02/02/2014  . Chronic anticoagulation 02/02/2014    Jones Bales, PT, DPT 11/02/2020, 1:34 PM  [facility] [address] [place], [place], [address] Phone: [phone]   Fax:  [phone]  Name: Walter [name] MRN: [id number] Date of Birth: 18-Aug-1946

## 2020-11-02 NOTE — Patient Instructions (Signed)
Access Code: [email or web address] URL: [email or web address] Date: 11/02/2020 Prepared by: Baldomero Lamy  Exercises Romberg Stance on Foam Pad with Head Rotation - 1 x daily - 5 x weekly - 2 sets - 10 reps Romberg Stance with Head Nods on Foam Pad - 1 x daily - 5 x weekly - 2 sets - 10 reps

## 2020-11-04 ENCOUNTER — Ambulatory Visit: Payer: Medicare HMO | Admitting: Physical Therapy

## 2020-11-04 ENCOUNTER — Encounter: Payer: Self-pay | Admitting: Physical Therapy

## 2020-11-04 ENCOUNTER — Other Ambulatory Visit: Payer: Self-pay

## 2020-11-04 DIAGNOSIS — R2689 Other abnormalities of gait and mobility: Secondary | ICD-10-CM

## 2020-11-04 DIAGNOSIS — R2681 Unsteadiness on feet: Secondary | ICD-10-CM | POA: Diagnosis not present

## 2020-11-04 DIAGNOSIS — R42 Dizziness and giddiness: Secondary | ICD-10-CM

## 2020-11-04 NOTE — Therapy (Signed)
[facility] [address] [place], [place], [address] Phone: [phone]   Fax:  [phone]  Physical Therapy Treatment  Patient Details  Name: Walter [name] MRN: [id number] Date of Birth: July 16, 1946 Referring Provider (PT): [name], MD   Encounter Date: 11/04/2020    11/04/20 1022  PT Visits / Re-Eval  Visit Number 3  Number of Visits 9  Date for PT Re-Evaluation 12/27/20 (written for 30 day POC)  Authorization  Authorization Type Aetna Medicare  PT Time Calculation  PT Start Time 1019  PT Stop Time 1100  PT Time Calculation (min) 41 min  PT - End of Session  Equipment Utilized During Treatment Gait belt  Activity Tolerance Patient tolerated treatment well  Behavior During Therapy [facility] for tasks assessed/performed     Past Medical History:  Diagnosis Date  . Abdominal pain   . Atrial fibrillation ([place])   . Barrett's esophagus   . CAD (coronary artery disease)   . Cancer [facility])    prostate  . Chronic kidney disease   . Colon polyp   . CVA (cerebral vascular accident) ([place]) 12/2014  . Diabetes mellitus    w/neuropathy  . Gum disease   . Hiatal hernia   . Hypertension   . Kidney stone    hx of  . Numbness    hands, feet  . OSA on CPAP   . Paresthesia of bilateral legs   . Pleural effusion   . Prostate cancer [facility]) 2007   radiation tx [place]  . PTSD (post-traumatic stress disorder)   . Stroke ([place])   . Vertigo     Past Surgical History:  Procedure Laterality Date  . CATARACT EXTRACTION  2006 and 2007   bilateral  . COLON SURGERY  05/2011   hemicolectomy  . COLONOSCOPY    . CORONARY ARTERY BYPASS GRAFT  2004   x3    There were no vitals filed for this visit.   Subjective Assessment - 11/04/20 1021    Subjective No new compliants. No falls. Has not done the ex's as yet at home.    Pertinent History diabetes mellitus w/ neuropathy, CVA (12/2014), prostate cancer, vertigo, HTN,  CKD, a fib, CABG x3 (2004). 2 episodes of passing out in the last 3 years (being followed by cardiology for this)    Limitations Standing;Walking    Patient Stated Goals find out what he needs to do to improve his balance    Currently in Pain? No/denies                 [facility] Adult PT Treatment/Exercise - 11/04/20 1023      Transfers   Transfers Sit to Stand;Stand to Sit    Sit to Stand 5: Supervision;Without upper extremity assist    Stand to Sit 5: Supervision;Without upper extremity assist      Ambulation/Gait   Ambulation/Gait Yes    Ambulation/Gait Assistance 5: Supervision    Ambulation/Gait Assistance Details around gym with session    Assistive device None    Gait Pattern Step-through pattern    Ambulation Surface Level;Indoor      Neuro Re-ed    Neuro Re-ed Details  added to HEP to further address balance. Refer to [place] for full details.          Issued the following to HEP this session:   Access Code: [email or web address] URL: [email or web address] Date: 11/04/2020 Prepared by: Willow Ora  Exercises Wide Stance with Eyes Closed and Head Rotation  on Foam Pad - 1 x daily - 5 x weekly - 1 sets - 10 reps Wide Stance with Eyes Closed and Head Nods on Foam Pad - 1 x daily - 5 x weekly - 1 sets - 10 reps Wide Tandem Stance on Foam Pad with Eyes Closed - 1 x daily - 5 x weekly - 1 sets - 3 reps - 30 hold Single Leg Stance with Support - 1 x daily - 5 x weekly - 1 sets - 3 reps - 10 hold Tandem Walking with Counter Support - 1 x daily - 5 x weekly - 1 sets - 3 reps Backward Walking with Counter Support - 1 x daily - 5 x weekly - 1 sets - 4 reps       PT Education - 11/04/20 1056    Education Details additions to HEP    Person(s) Educated Patient    Methods Explanation;Demonstration;Verbal cues;Handout    Comprehension Verbalized understanding;Returned demonstration;Verbal cues required;Need further instruction            PT Short Term Goals -  10/28/20 1303      PT SHORT TERM GOAL #1   Title ALL STGS = LTGS             PT Long Term Goals - 11/02/20 1330      PT LONG TERM GOAL #1   Title Pt will independently perform HEP to maximize functional gains made in PT.  ALL LTGS DUE 11/25/20    Time 4    Period Weeks    Status New      PT LONG TERM GOAL #2   Title Pt will improve FGA score to at least a 24/30 in order to demo decr fall risk.    Baseline 19/30    Time 4    Period Weeks    Status New      PT LONG TERM GOAL #3   Title Pt will improve vestibular to >/= 50% on SOT to demonstrate improved vestibular input for balance.    Baseline approx 40%    Time 4    Period Weeks    Status Revised      PT LONG TERM GOAL #4   Title Pt will improve condition 4 of mCTSIB to at least 30 seconds in order to demo improved vestibular input for balance.    Baseline 17 seconds    Time 4    Period Weeks    Status New              11/04/20 1022  Plan  Clinical Impression Statement Today's skilled session focused on review and additions to HEP with no issues noted or reported in session. The pt is progressing toward goals and should benefit from continued PT to progress toward unmet goals.  Personal Factors and Comorbidities Comorbidity 3+;Time since onset of injury/illness/exacerbation;Past/Current Experience  Comorbidities diabetes mellitus w/ neuropathy, CVA (12/2014), prostate cancer, vertigo, HTN, CKD, a fib  Examination-Activity Limitations Stairs;Transfers;Locomotion Level  Examination-Participation Restrictions Community Activity  Pt will benefit from skilled therapeutic intervention in order to improve on the following deficits Abnormal gait;Decreased balance;Difficulty walking;Decreased strength;Dizziness  Stability/Clinical Decision Making Stable/Uncomplicated  Rehab Potential Excellent  PT Frequency 2x / week  PT Duration 4 weeks  PT Treatment/Interventions ADLs/Self Care Home Management;Gait training;Therapeutic  activities;Functional mobility training;Stair training;Therapeutic exercise;Balance training;Neuromuscular re-education;Patient/family education;Vestibular  PT Next Visit Plan continue to work on balance on compliant surfaces with and without vision removed; dynamic gait/balance activites  Consulted and  Agree with Plan of Care Patient         Patient will benefit from skilled therapeutic intervention in order to improve the following deficits and impairments:  Abnormal gait,Decreased balance,Difficulty walking,Decreased strength,Dizziness  Visit Diagnosis: Unsteadiness on feet  Other abnormalities of gait and mobility  Dizziness and giddiness     Problem List Patient Active Problem List   Diagnosis Date Noted  . Obstructive sleep apnea 06/20/2017  . Hyperlipidemia 04/17/2016  . Cerebrovascular accident (CVA) due to embolism of left middle cerebral artery ([place]) 04/17/2016  . Type 2 diabetes mellitus with circulatory disorder ([place]) 01/14/2016  . On amiodarone therapy 03/10/2015  . Stroke ([place]) 12/31/2014  . Dysarthria 12/30/2014  . Chronic systolic heart failure ([place]) 12/28/2014  . Coronary artery disease involving coronary bypass graft of native heart with angina pectoris ([place]) 02/02/2014  . Paroxysmal atrial fibrillation ([place]) 02/02/2014  . Essential hypertension 02/02/2014  . Chronic anticoagulation 02/02/2014    Willow Ora, PTA, [facility] [address] [place], [place] [address] [phone] 11/06/20, 9:14 PM   Name: Walter [name] MRN: [id number] Date of Birth: 1946-06-04

## 2020-11-04 NOTE — Patient Instructions (Signed)
Access Code: [email or web address] URL: [email or web address] Date: 11/04/2020 Prepared by: Willow Ora  Exercises Wide Stance with Eyes Closed and Head Rotation on Foam Pad - 1 x daily - 5 x weekly - 1 sets - 10 reps Wide Stance with Eyes Closed and Head Nods on Foam Pad - 1 x daily - 5 x weekly - 1 sets - 10 reps Wide Tandem Stance on Foam Pad with Eyes Closed - 1 x daily - 5 x weekly - 1 sets - 3 reps - 30 hold Single Leg Stance with Support - 1 x daily - 5 x weekly - 1 sets - 3 reps - 10 hold Tandem Walking with Counter Support - 1 x daily - 5 x weekly - 1 sets - 3 reps Backward Walking with Counter Support - 1 x daily - 5 x weekly - 1 sets - 4 reps

## 2020-11-09 ENCOUNTER — Encounter: Payer: Self-pay | Admitting: Physical Therapy

## 2020-11-09 ENCOUNTER — Ambulatory Visit: Payer: Medicare HMO | Attending: Diagnostic Neuroimaging | Admitting: Physical Therapy

## 2020-11-09 ENCOUNTER — Other Ambulatory Visit: Payer: Self-pay

## 2020-11-09 DIAGNOSIS — R42 Dizziness and giddiness: Secondary | ICD-10-CM | POA: Insufficient documentation

## 2020-11-09 DIAGNOSIS — R2689 Other abnormalities of gait and mobility: Secondary | ICD-10-CM | POA: Insufficient documentation

## 2020-11-09 DIAGNOSIS — R2681 Unsteadiness on feet: Secondary | ICD-10-CM | POA: Diagnosis present

## 2020-11-09 NOTE — Therapy (Signed)
[facility] [address] [place], [place], [address] Phone: [phone]   Fax:  [phone]  Physical Therapy Treatment  Patient Details  Name: Walter [name] MRN: [id number] Date of Birth: September 16, 1946 Referring Provider (PT): [name], MD   Encounter Date: 11/09/2020   PT End of Session - 11/09/20 1159    Visit Number 4    Number of Visits 9    Date for PT Re-Evaluation 12/27/20   written for 30 day POC   Authorization Type Aetna Medicare    PT Start Time 1018    PT Stop Time 1058    PT Time Calculation (min) 40 min    Equipment Utilized During Treatment Gait belt    Activity Tolerance Patient tolerated treatment well    Behavior During Therapy [facility] for tasks assessed/performed           Past Medical History:  Diagnosis Date  . Abdominal pain   . Atrial fibrillation ([place])   . Barrett's esophagus   . CAD (coronary artery disease)   . Cancer [facility])    prostate  . Chronic kidney disease   . Colon polyp   . CVA (cerebral vascular accident) ([place]) 12/2014  . Diabetes mellitus    w/neuropathy  . Gum disease   . Hiatal hernia   . Hypertension   . Kidney stone    hx of  . Numbness    hands, feet  . OSA on CPAP   . Paresthesia of bilateral legs   . Pleural effusion   . Prostate cancer [facility]) 2007   radiation tx [place]  . PTSD (post-traumatic stress disorder)   . Stroke ([place])   . Vertigo     Past Surgical History:  Procedure Laterality Date  . CATARACT EXTRACTION  2006 and 2007   bilateral  . COLON SURGERY  05/2011   hemicolectomy  . COLONOSCOPY    . CORONARY ARTERY BYPASS GRAFT  2004   x3    There were no vitals filed for this visit.   Subjective Assessment - 11/09/20 1020    Subjective Has not done the exercises yet at home. No falls.    Pertinent History diabetes mellitus w/ neuropathy, CVA (12/2014), prostate cancer, vertigo, HTN, CKD, a fib, CABG x3 (2004). 2 episodes of passing out in  the last 3 years (being followed by cardiology for this)    Limitations Standing;Walking    Patient Stated Goals find out what he needs to do to improve his balance    Currently in Pain? No/denies                                  Balance Exercises - 11/09/20 1037      Balance Exercises: Standing   Standing Eyes Closed Narrow base of support (BOS);Wide (BOA);Foam/compliant surface;Limitations    Standing Eyes Closed Limitations feet hip width distance 2 x 30 seconds, more narrow BOS 2 x 30 seconds, with wide BOS 2 x 10 reps head nods, 2 x 10 reps head turns    Tandem Stance Eyes open;Foam/compliant surface;Intermittent upper extremity support;1 rep;20 secs   performed with BLE   Rockerboard Anterior/posterior;Limitations    Rockerboard Limitations shifting weight in A/P direction x20 reps - cues for hip strategy eyes closed 3 x 20 seconds with min guard/intermittent taps to wall/chair for balance    Marching Foam/compliant surface;10 reps;Limitations    Marching Limitations on  blue air ex: alternating x10 reps each leg, cues for slowed and controlled    Other Standing Exercises Comments on blue mat: tandem gait, forward marching down and back x3 reps each with intermittent UE support             PT Education - 11/09/20 1159    Education Details importance of performing HEP    Person(s) Educated Patient    Methods Explanation    Comprehension Verbalized understanding            PT Short Term Goals - 10/28/20 1303      PT SHORT TERM GOAL #1   Title ALL STGS = LTGS             PT Long Term Goals - 11/02/20 1330      PT LONG TERM GOAL #1   Title Pt will independently perform HEP to maximize functional gains made in PT.  ALL LTGS DUE 11/25/20    Time 4    Period Weeks    Status New      PT LONG TERM GOAL #2   Title Pt will improve FGA score to at least a 24/30 in order to demo decr fall risk.    Baseline 19/30    Time 4    Period Weeks     Status New      PT LONG TERM GOAL #3   Title Pt will improve vestibular to >/= 50% on SOT to demonstrate improved vestibular input for balance.    Baseline approx 40%    Time 4    Period Weeks    Status Revised      PT LONG TERM GOAL #4   Title Pt will improve condition 4 of mCTSIB to at least 30 seconds in order to demo improved vestibular input for balance.    Baseline 17 seconds    Time 4    Period Weeks    Status New                 Plan - 11/09/20 1200    Clinical Impression Statement Today's skilled session focused on balance strategies for incr vestibular input, narrow BOS, and on compliant surfaces. Pt needing min A/UE support at times for balance, esp with narrow BOS and eyes closed on rockerboard. Will continue to progress towards LTGs.    Personal Factors and Comorbidities Comorbidity 3+;Time since onset of injury/illness/exacerbation;Past/Current Experience    Comorbidities diabetes mellitus w/ neuropathy, CVA (12/2014), prostate cancer, vertigo, HTN, CKD, a fib    Examination-Activity Limitations Stairs;Transfers;Locomotion Level    Examination-Participation Restrictions Community Activity    Stability/Clinical Decision Making Stable/Uncomplicated    Rehab Potential Excellent    PT Frequency 2x / week    PT Duration 4 weeks    PT Treatment/Interventions ADLs/Self Care Home Management;Gait training;Therapeutic activities;Functional mobility training;Stair training;Therapeutic exercise;Balance training;Neuromuscular re-education;Patient/family education;Vestibular    PT Next Visit Plan continue to work on balance on compliant surfaces with and without vision removed; dynamic gait/balance activites    Consulted and Agree with Plan of Care Patient           Patient will benefit from skilled therapeutic intervention in order to improve the following deficits and impairments:  Abnormal gait,Decreased balance,Difficulty walking,Decreased strength,Dizziness  Visit  Diagnosis: Other abnormalities of gait and mobility  Unsteadiness on feet     Problem List Patient Active Problem List   Diagnosis Date Noted  . Obstructive sleep apnea 06/20/2017  . Hyperlipidemia 04/17/2016  .  Cerebrovascular accident (CVA) due to embolism of left middle cerebral artery ([place]) 04/17/2016  . Type 2 diabetes mellitus with circulatory disorder ([place]) 01/14/2016  . On amiodarone therapy 03/10/2015  . Stroke ([place]) 12/31/2014  . Dysarthria 12/30/2014  . Chronic systolic heart failure ([place]) 12/28/2014  . Coronary artery disease involving coronary bypass graft of native heart with angina pectoris ([place]) 02/02/2014  . Paroxysmal atrial fibrillation ([place]) 02/02/2014  . Essential hypertension 02/02/2014  . Chronic anticoagulation 02/02/2014    Arliss Journey 11/09/2020, 12:02 PM  [facility] [address] [place], [place], [address] Phone: [phone]   Fax:  [phone]  Name: Walter [name] MRN: [id number] Date of Birth: 01-Dec-1945

## 2020-11-11 ENCOUNTER — Ambulatory Visit: Payer: Medicare HMO | Admitting: Physical Therapy

## 2020-11-15 ENCOUNTER — Ambulatory Visit: Payer: Medicare HMO | Admitting: Physical Therapy

## 2020-11-18 ENCOUNTER — Ambulatory Visit: Payer: Medicare HMO | Admitting: Physical Therapy

## 2020-11-22 ENCOUNTER — Ambulatory Visit: Payer: Medicare HMO

## 2020-11-25 ENCOUNTER — Ambulatory Visit: Payer: 59 | Admitting: Physical Therapy

## 2020-11-29 ENCOUNTER — Ambulatory Visit: Payer: Medicare HMO

## 2020-11-29 ENCOUNTER — Other Ambulatory Visit: Payer: Self-pay

## 2020-11-29 VITALS — BP 102/70 | HR 78

## 2020-11-29 DIAGNOSIS — R2681 Unsteadiness on feet: Secondary | ICD-10-CM

## 2020-11-29 DIAGNOSIS — R2689 Other abnormalities of gait and mobility: Secondary | ICD-10-CM | POA: Diagnosis not present

## 2020-11-29 DIAGNOSIS — R42 Dizziness and giddiness: Secondary | ICD-10-CM

## 2020-11-29 NOTE — Therapy (Signed)
[facility] [address] [place], [place], [address] Phone: [phone]   Fax:  [phone]  Physical Therapy Treatment  Patient Details  Name: Walter [name] MRN: [id number] Date of Birth: 05/07/1946 Referring Provider (PT): [name], MD   Encounter Date: 11/29/2020   PT End of Session - 11/29/20 1108    Visit Number 5    Number of Visits 9    Date for PT Re-Evaluation 12/27/20   written for 30 day POC   Authorization Type Aetna Medicare    PT Start Time 1102    PT Stop Time 1144    PT Time Calculation (min) 42 min    Equipment Utilized During Treatment Gait belt    Activity Tolerance Patient tolerated treatment well    Behavior During Therapy [facility] for tasks assessed/performed           Past Medical History:  Diagnosis Date  . Abdominal pain   . Atrial fibrillation ([place])   . Barrett's esophagus   . CAD (coronary artery disease)   . Cancer [facility])    prostate  . Chronic kidney disease   . Colon polyp   . CVA (cerebral vascular accident) ([place]) 12/2014  . Diabetes mellitus    w/neuropathy  . Gum disease   . Hiatal hernia   . Hypertension   . Kidney stone    hx of  . Numbness    hands, feet  . OSA on CPAP   . Paresthesia of bilateral legs   . Pleural effusion   . Prostate cancer [facility]) 2007   radiation tx [place]  . PTSD (post-traumatic stress disorder)   . Stroke ([place])   . Vertigo     Past Surgical History:  Procedure Laterality Date  . CATARACT EXTRACTION  2006 and 2007   bilateral  . COLON SURGERY  05/2011   hemicolectomy  . COLONOSCOPY    . CORONARY ARTERY BYPASS GRAFT  2004   x3    Vitals:   11/29/20 1108  BP: 102/70  Pulse: 78     Subjective Assessment - 11/29/20 1104    Subjective Missed therapy for two weeks due to [place]. Still just feeling fatigue, more imbalanced.  No falls to report. Has been sleeping alot due to fatigue.    Pertinent History diabetes mellitus w/  neuropathy, CVA (12/2014), prostate cancer, vertigo, HTN, CKD, a fib, CABG x3 (2004). 2 episodes of passing out in the last 3 years (being followed by cardiology for this)    Limitations Standing;Walking    Patient Stated Goals find out what he needs to do to improve his balance    Currently in Pain? No/denies              [facility] Adult PT Treatment/Exercise - 11/29/20 0001      Transfers   Transfers Sit to Stand;Stand to Sit    Sit to Stand 5: Supervision;Without upper extremity assist    Stand to Sit 5: Supervision;Without upper extremity assist    Comments completed sit <> stand with BLE placed on airex, completed x 10 reps. Progressed to completing on red balance beam x 10, UE support required for completion. CGA required on balance beam due to increase challenge noted.      Ambulation/Gait   Ambulation/Gait Yes    Ambulation/Gait Assistance 5: Supervision    Ambulation/Gait Assistance Details with high level balance activities    Assistive device None    Gait Pattern Step-through pattern  Ambulation Surface Level;Indoor      High Level Balance   High Level Balance Activities Backward walking;Head turns;Tandem walking;Marching forwards    High Level Balance Comments In hallway: completed all of the following x 3 laps down and back approx 50' each including backwards walking, tandem walking, walking with head turns, and marching forwards. Increased challenge with tandem walking and marching forwards requiring intermittent touching from wall and CGA to maintain balance. Patient require verbal cues for improved tandem walking. Intermittent rest breaks required due to fatigue.            Balance Exercises - 11/29/20 0001      Balance Exercises: Standing   Standing Eyes Closed Narrow base of support (BOS);Wide (BOA);Head turns;Foam/compliant surface;3 reps;30 secs;Limitations    Standing Eyes Closed Limitations completed 3 x 30 seconds with eyes closed with feet hip width,  progressing to narrow BOS increased sway noted with narrow BOS. with wide BOS compelted horizontal/vertical head turns 2 x 10 reps with eyes clsoed. Increased challenge with head turns noted, requiring CGA from PT and intermittent UE support             PT Education - 11/29/20 1246    Education Details Educating on scheduling missed two weeks of therapy    Person(s) Educated Patient    Methods Explanation    Comprehension Verbalized understanding            PT Short Term Goals - 10/28/20 1303      PT SHORT TERM GOAL #1   Title ALL STGS = LTGS             PT Long Term Goals - 11/29/20 1144      PT LONG TERM GOAL #1   Title Pt will independently perform HEP to maximize functional gains made in PT.  ALL LTGS DUE 12/16/20    Time 4    Period Weeks    Status New      PT LONG TERM GOAL #2   Title Pt will improve FGA score to at least a 24/30 in order to demo decr fall risk.    Baseline 19/30    Time 4    Period Weeks    Status New      PT LONG TERM GOAL #3   Title Pt will improve vestibular to >/= 50% on SOT to demonstrate improved vestibular input for balance.    Baseline approx 40%    Time 4    Period Weeks    Status Revised      PT LONG TERM GOAL #4   Title Pt will improve condition 4 of mCTSIB to at least 30 seconds in order to demo improved vestibular input for balance.    Baseline 17 seconds    Time 4    Period Weeks    Status New                 Plan - 11/29/20 1143    Clinical Impression Statement Patient returns to therapy after missing 2 weeks due to [place]. PT got patient scheduled out for rest of POC and updated LTG date due to absence. Continued high level balance activities working on complaint surfaces, and with vision removed to further challenge balance. Will continue to progress toward all LTGs.    Personal Factors and Comorbidities Comorbidity 3+;Time since onset of injury/illness/exacerbation;Past/Current Experience    Comorbidities  diabetes mellitus w/ neuropathy, CVA (12/2014), prostate cancer, vertigo, HTN, CKD, a fib  Examination-Activity Limitations Stairs;Transfers;Locomotion Level    Examination-Participation Restrictions Community Activity    Stability/Clinical Decision Making Stable/Uncomplicated    Rehab Potential Excellent    PT Frequency 2x / week    PT Duration 4 weeks    PT Treatment/Interventions ADLs/Self Care Home Management;Gait training;Therapeutic activities;Functional mobility training;Stair training;Therapeutic exercise;Balance training;Neuromuscular re-education;Patient/family education;Vestibular    PT Next Visit Plan continue to work on balance on compliant surfaces with and without vision removed; dynamic gait/balance activites    Consulted and Agree with Plan of Care Patient           Patient will benefit from skilled therapeutic intervention in order to improve the following deficits and impairments:  Abnormal gait,Decreased balance,Difficulty walking,Decreased strength,Dizziness  Visit Diagnosis: Other abnormalities of gait and mobility  Unsteadiness on feet  Dizziness and giddiness     Problem List Patient Active Problem List   Diagnosis Date Noted  . Obstructive sleep apnea 06/20/2017  . Hyperlipidemia 04/17/2016  . Cerebrovascular accident (CVA) due to embolism of left middle cerebral artery ([place]) 04/17/2016  . Type 2 diabetes mellitus with circulatory disorder ([place]) 01/14/2016  . On amiodarone therapy 03/10/2015  . Stroke ([place]) 12/31/2014  . Dysarthria 12/30/2014  . Chronic systolic heart failure ([place]) 12/28/2014  . Coronary artery disease involving coronary bypass graft of native heart with angina pectoris ([place]) 02/02/2014  . Paroxysmal atrial fibrillation ([place]) 02/02/2014  . Essential hypertension 02/02/2014  . Chronic anticoagulation 02/02/2014    Jones Bales, PT, DPT 11/29/2020, 12:48 PM  [facility] [address] [place], [place], [address] Phone: [phone]   Fax:  [phone]  Name: Mckinnon Glick MRN: [id number] Date of Birth: [birth year]

## 2020-11-29 NOTE — Progress Notes (Signed)
Cardiology Office Note:    Date:  11/30/2020   ID:  Jacobus, Colvin [birth year], MRN [id number]  PCP:  Lavone Orn, MD  Cardiologist:  Sinclair Grooms, MD   Referring MD: Lavone Orn, MD   Chief Complaint  Patient presents with  . Coronary Artery Disease  . Atrial Fibrillation  . Congestive Heart Failure    History of Present Illness:    Norvil Martensen is a 75 y.o. male with a hx of paroxysmal atrial fibrillation, CAD with prior CABG, hypertension, CKD 2, chronic anticoagulation therapy,prior embolic CVA,and amiodarone therapy.  Within the past month he was started on Farxiga. Over the past 2 to 3 weeks he has felt somewhat sluggish and fatigued.  He denies angina, orthopnea, but is having dizziness if he bends and stands up abruptly. He has not needed sublingual nitroglycerin. Otherwise there are no complaints.   Past Medical History:  Diagnosis Date  . Abdominal pain   . Atrial fibrillation ([place])   . Barrett's esophagus   . CAD (coronary artery disease)   . Cancer [facility])    prostate  . Chronic kidney disease   . Colon polyp   . CVA (cerebral vascular accident) ([place]) 12/2014  . Diabetes mellitus    w/neuropathy  . Gum disease   . Hiatal hernia   . Hypertension   . Kidney stone    hx of  . Numbness    hands, feet  . OSA on CPAP   . Paresthesia of bilateral legs   . Pleural effusion   . Prostate cancer [facility]) 2007   radiation tx [place]  . PTSD (post-traumatic stress disorder)   . Stroke ([place])   . Vertigo     Past Surgical History:  Procedure Laterality Date  . CATARACT EXTRACTION  2006 and 2007   bilateral  . COLON SURGERY  05/2011   hemicolectomy  . COLONOSCOPY    . CORONARY ARTERY BYPASS GRAFT  2004   x3    Current Medications: Current Meds  Medication Sig  . acetaminophen (TYLENOL) 325 MG tablet Take 650 mg by mouth every 6 (six) hours as needed.  Marland Kitchen amiodarone (PACERONE) 200 MG tablet Take 0.5 tablets (100 mg total) by mouth daily.  Marland Kitchen  buPROPion (WELLBUTRIN XL) 150 MG 24 hr tablet Take 150 mg by mouth daily.  . calcium carbonate (TUMS - DOSED IN MG ELEMENTAL CALCIUM) 500 MG chewable tablet Chew 1 tablet by mouth daily.  . Continuous Blood Gluc Receiver ([place]) DEVI by Does not apply route.  . Continuous Blood Gluc Sensor (DEXCOM G6 SENSOR) MISC by Does not apply route.  . Continuous Blood Gluc Transmit (DEXCOM G6 TRANSMITTER) MISC See admin instructions.  . dapagliflozin propanediol (FARXIGA) 10 MG TABS tablet Take by mouth daily.  Marland Kitchen escitalopram (LEXAPRO) 20 MG tablet Take 20 mg by mouth daily.  Marland Kitchen esomeprazole (NEXIUM) 40 MG capsule Take 40 mg by mouth daily at 12 noon.  . Ferrous Sulfate (IRON PO) Take 1 tablet by mouth daily.  Marland Kitchen levocetirizine (XYZAL) 5 MG tablet Take 5 mg by mouth daily.  Marland Kitchen levothyroxine (SYNTHROID) 25 MCG tablet Take 25 mcg by mouth daily.  Marland Kitchen lisinopril (ZESTRIL) 20 MG tablet TAKE 1 TABLET DAILY  . metoprolol succinate (TOPROL-XL) 50 MG 24 hr tablet TAKE ONE AND ONE-HALF TABLETS DAILY WITH OR IMMEDIATELY FOLLOWING A MEAL  . Omega-3-acid Ethyl Esters (LOVAZA PO) Take 1 tablet by mouth 2 (two) times daily.  . polyethylene glycol (MIRALAX / GLYCOLAX)  17 g packet Take 17 g by mouth as needed.  . rosuvastatin (CRESTOR) 10 MG tablet Take 10 mg by mouth at bedtime.  . tamsulosin (FLOMAX) 0.4 MG CAPS capsule Take 0.4 mg by mouth daily.  . TOVIAZ 8 MG TB24 tablet Take 8 mg by mouth daily.  . TRESIBA FLEXTOUCH 200 UNIT/ML SOPN Inject 80 Units into the skin daily.  . TRULICITY 4.16 SA/6.3KZ SOPN Inject 0.75 mg into the skin once a week.  Alveda Reasons 20 MG TABS tablet TAKE 1 TABLET DAILY     Allergies:   Aspirin, Byetta 10 mcg pen [exenatide], Invokana [canagliflozin], Levemir [insulin detemir], Nsaids, and Short ragweed pollen ext   Social History   Socioeconomic History  . Marital status: Married    Spouse name: Refujio Haymer  . Number of children: 2  . Years of education: BFA  . Highest education  level: Not on file  Occupational History  . Occupation: Retired     Comment: retired Engineer, technical sales  Tobacco Use  . Smoking status: Former Smoker    Years: 26.00    Types: E-cigarettes  . Smokeless tobacco: Never Used  Vaping Use  . Vaping Use: Never used  Substance and Sexual Activity  . Alcohol use: Yes    Alcohol/week: 3.0 standard drinks    Types: 1 Glasses of wine, 1 Cans of beer, 1 Shots of liquor per week    Comment: social  . Drug use: Yes    Types: Marijuana    Comment: frequently--THC  . Sexual activity: Not on file  Other Topics Concern  . Not on file  Social History Narrative   Left handed    Lives with wife   Caffeine -2 cups per day   Social Determinants of Health   Financial Resource Strain: Not on file  Food Insecurity: Not on file  Transportation Needs: Not on file  Physical Activity: Not on file  Stress: Not on file  Social Connections: Not on file     Family History: The patient's family history includes Angina in his mother; Coronary artery disease in his father; Diabetes in his father; Stroke in his father.  ROS:   Please see the history of present illness.    Increase in creatinine noted on blood work done at the end of December with a 1.81 value. This is above the 1.45 that he previously had. All other systems reviewed and are negative.  EKGs/Labs/Other Studies Reviewed:    The following studies were reviewed today:  Laboratory data from December 13th 2021: A1c 8.4 LDL 65 total cholesterol 143 creatinine 1.8 potassium 4.6 TSH 14.8 an ALT 37. EKG:  EKG normal sinus rhythm, left anterior hemiblock, without ST-T wave change.  When compared to August 27, 2019, no changes noted  Recent Labs: No results found for requested labs within last 8760 hours.  Recent Lipid Panel    Component Value Date/Time   CHOL 109 02/26/2019 0824   TRIG 126 02/26/2019 0824   HDL 51 02/26/2019 0824   CHOLHDL 2.1 02/26/2019 0824   CHOLHDL 3.5 12/31/2014 0555   VLDL 42  (H) 12/31/2014 0555   LDLCALC 33 02/26/2019 0824    Physical Exam:    VS:  Pulse 61   Ht 5\' 8"  (1.727 m)   Wt 199 lb (90.3 kg)   SpO2 97%   BMI 30.26 kg/m     Wt Readings from Last 3 Encounters:  11/30/20 199 lb (90.3 kg)  09/23/20 205 lb 3.2 oz (93.1 kg)  05/18/20 202 lb (91.6 kg)     GEN: Overweight. No acute distress HEENT: Normal NECK: No JVD. LYMPHATICS: No lymphadenopathy CARDIAC: No murmur. RRR no gallop, or edema. VASCULAR:  Normal Pulses. No bruits. RESPIRATORY:  Clear to auscultation without rales, wheezing or rhonchi  ABDOMEN: Soft, non-tender, non-distended, No pulsatile mass, MUSCULOSKELETAL: No deformity  SKIN: Warm and dry NEUROLOGIC:  Alert and oriented x 3 PSYCHIATRIC:  Normal affect   ASSESSMENT:    1. Coronary artery disease involving coronary bypass graft of native heart with angina pectoris ([place])   2. Chronic systolic heart failure (HCC)   3. Paroxysmal atrial fibrillation ([place])   4. Chronic anticoagulation   5. Essential hypertension   6. OSA on CPAP   7. Type 2 diabetes mellitus with other circulatory complication, without long-term current use of insulin ([place])   8. On amiodarone therapy   9. Educated about COVID-19 virus infection    PLAN:    In order of problems listed above:  1. Asymptomatic. Secondary prevention is being practiced. 2. Wilder Glade has been added to ACE inhibitor and beta-blocker therapy. 3. Amiodarone is being used with good rhythm control. Liver and TSH were normal within the past 3 months. 4. No bleeding complications on Xarelto. 5. Blood pressure is low today, 75/52 mmHg. My hypothesis is that the diuretic impact of Farxiga in combination with Toprol-XL and Zestril are lowering the blood pressure to this degree. Plan to hold Zestril. Depending upon blood pressure response, may resume at a lower dose either 5 or 10 mg. Will check a basic metabolic panel on return in 1 week. Toprol-XL is being decreased from 75 mg/day to 50  mg/day. To return to the blood pressure clinic in 1 week to be reassessed. Return to renin-angiotensin system blockade will be dependent upon laboratory work and blood pressure response to medication adjustment. 6. Continue CPAP 7. A1c was unacceptable, greater than 8. Hopefully Wilder Glade will facilitate improvement in glycemic control. 8. Amiodarone being used without toxicity. 9. Tested positive for Covid and had cold-like symptoms. This occurred despite being boosted although he was now careful with social distancing and masking.  87-month follow-up with me.  1 week follow-up pressure clinic to document improvement off Zestril and on lower dose of beta-blocker since starting Farxiga. Improved blood pressure, could consider resuming Zestril at either 5 or 10 mg/day.  Overall education and awareness concerning secondary risk prevention was discussed in detail: LDL less than 70, hemoglobin A1c less than 7, blood pressure target less than 130/80 mmHg, >150 minutes of moderate aerobic activity per week, avoidance of smoking, weight control (via diet and exercise), and continued surveillance/management of/for obstructive sleep apnea.    Medication Adjustments/Labs and Tests Ordered: Current medicines are reviewed at length with the patient today.  Concerns regarding medicines are outlined above.  Orders Placed This Encounter  Procedures  . EKG 12-Lead   No orders of the defined types were placed in this encounter.   There are no Patient Instructions on file for this visit.   Signed, Sinclair Grooms, MD  11/30/2020 9:22 AM    [place]

## 2020-11-30 ENCOUNTER — Encounter: Payer: Self-pay | Admitting: Interventional Cardiology

## 2020-11-30 ENCOUNTER — Ambulatory Visit (INDEPENDENT_AMBULATORY_CARE_PROVIDER_SITE_OTHER): Payer: Medicare HMO | Admitting: Interventional Cardiology

## 2020-11-30 ENCOUNTER — Other Ambulatory Visit: Payer: Self-pay

## 2020-11-30 VITALS — HR 61 | Ht 68.0 in | Wt 199.0 lb

## 2020-11-30 DIAGNOSIS — Z7901 Long term (current) use of anticoagulants: Secondary | ICD-10-CM | POA: Diagnosis not present

## 2020-11-30 DIAGNOSIS — I25709 Atherosclerosis of coronary artery bypass graft(s), unspecified, with unspecified angina pectoris: Secondary | ICD-10-CM | POA: Diagnosis not present

## 2020-11-30 DIAGNOSIS — I480 Paroxysmal atrial fibrillation: Secondary | ICD-10-CM | POA: Diagnosis not present

## 2020-11-30 DIAGNOSIS — Z79899 Other long term (current) drug therapy: Secondary | ICD-10-CM

## 2020-11-30 DIAGNOSIS — I5022 Chronic systolic (congestive) heart failure: Secondary | ICD-10-CM | POA: Diagnosis not present

## 2020-11-30 DIAGNOSIS — Z9989 Dependence on other enabling machines and devices: Secondary | ICD-10-CM

## 2020-11-30 DIAGNOSIS — G4733 Obstructive sleep apnea (adult) (pediatric): Secondary | ICD-10-CM

## 2020-11-30 DIAGNOSIS — Z7189 Other specified counseling: Secondary | ICD-10-CM

## 2020-11-30 DIAGNOSIS — E1159 Type 2 diabetes mellitus with other circulatory complications: Secondary | ICD-10-CM

## 2020-11-30 DIAGNOSIS — I10 Essential (primary) hypertension: Secondary | ICD-10-CM

## 2020-11-30 MED ORDER — METOPROLOL SUCCINATE ER 50 MG PO TB24: 50.0000 mg | ORAL_TABLET | Freq: Every day | ORAL | 3 refills | Status: DC

## 2020-11-30 NOTE — Patient Instructions (Signed)
Medication Instructions:  1) DISCONTINUE Lisinopril 2) DECREASE Metoprolol to 50mg  once daily  *If you need a refill on your cardiac medications before your next appointment, please call your pharmacy*   Lab Work: BMET in 1 week  If you have labs (blood work) drawn today and your tests are completely normal, you will receive your results only by: Marland Kitchen MyChart Message (if you have MyChart) OR . A paper copy in the mail If you have any lab test that is abnormal or we need to change your treatment, we will call you to review the results.   Testing/Procedures: None   Follow-Up:  Your physician recommends that you schedule a follow-up appointment in: 1 week with Hypertension Clinic.    At [name], you and your health needs are our priority.  As part of our continuing mission to provide you with exceptional heart care, we have created designated Provider Care Teams.  These Care Teams include your primary Cardiologist (physician) and Advanced Practice Providers (APPs -  Physician Assistants and Nurse Practitioners) who all work together to provide you with the care you need, when you need it.  We recommend signing up for the patient portal called "MyChart".  Sign up information is provided on this After Visit Summary.  MyChart is used to connect with patients for Virtual Visits (Telemedicine).  Patients are able to view lab/test results, encounter notes, upcoming appointments, etc.  Non-urgent messages can be sent to your provider as well.   To learn more about what you can do with MyChart, go to [email or web address].    Your next appointment:   6 month(s)  The format for your next appointment:   In Person  Provider:   You may see Sinclair Grooms, MD or one of the following Advanced Practice Providers on your designated Care Team:    Kathyrn Drown, NP    Other Instructions

## 2020-12-02 ENCOUNTER — Encounter: Payer: Self-pay | Admitting: Physical Therapy

## 2020-12-02 ENCOUNTER — Ambulatory Visit: Payer: Medicare HMO | Admitting: Physical Therapy

## 2020-12-02 ENCOUNTER — Other Ambulatory Visit: Payer: Self-pay

## 2020-12-02 VITALS — BP 114/65 | HR 62

## 2020-12-02 DIAGNOSIS — R2689 Other abnormalities of gait and mobility: Secondary | ICD-10-CM | POA: Diagnosis not present

## 2020-12-02 DIAGNOSIS — R42 Dizziness and giddiness: Secondary | ICD-10-CM

## 2020-12-02 DIAGNOSIS — R2681 Unsteadiness on feet: Secondary | ICD-10-CM

## 2020-12-02 NOTE — Therapy (Signed)
[place] [address] [place], [place], [address] Phone: [phone]   Fax:  [phone]  Physical Therapy Treatment  Patient Details  Name: Walter [name] MRN: [id number] Date of Birth: Feb 25, 1946 Referring Provider (PT): [name], MD   Encounter Date: 12/02/2020   PT End of Session - 12/02/20 1021    Visit Number 6    Number of Visits 9    Date for PT Re-Evaluation 12/27/20   written for 30 day POC   Authorization Type [employer] Medicare    PT Start Time 1017    PT Stop Time 1058    PT Time Calculation (min) 41 min    Equipment Utilized During Treatment Gait belt    Activity Tolerance Patient tolerated treatment well    Behavior During Therapy [facility] for tasks assessed/performed           Past Medical History:  Diagnosis Date  . Abdominal pain   . Atrial fibrillation ([place])   . Barrett's esophagus   . CAD (coronary artery disease)   . Cancer [facility])    prostate  . Chronic kidney disease   . Colon polyp   . CVA (cerebral vascular accident) ([place]) 12/2014  . Diabetes mellitus    w/neuropathy  . Gum disease   . Hiatal hernia   . Hypertension   . Kidney stone    hx of  . Numbness    hands, feet  . OSA on CPAP   . Paresthesia of bilateral legs   . Pleural effusion   . Prostate cancer [facility]) 2007   radiation tx [employer]  . PTSD (post-traumatic stress disorder)   . Stroke ([place])   . Vertigo     Past Surgical History:  Procedure Laterality Date  . CATARACT EXTRACTION  2006 and 2007   bilateral  . COLON SURGERY  05/2011   hemicolectomy  . COLONOSCOPY    . CORONARY ARTERY BYPASS GRAFT  2004   x3    Vitals:   12/02/20 1022 12/02/20 1041 12/02/20 1055  BP: 99/64 (!) 91/54 114/65  Pulse: 62 62 62     Subjective Assessment - 12/02/20 1018    Subjective Saw the cardiologist Wed where is BP was found to be low. BP meds have been adjusted and he goes back in a week for follow up. Since the adjustments  his lightheadedness and dizziness have improved significantly. No falls.    Pertinent History diabetes mellitus w/ neuropathy, CVA (12/2014), prostate cancer, vertigo, HTN, CKD, a fib, CABG x3 (2004). 2 episodes of passing out in the last 3 years (being followed by cardiology for this)    Limitations Standing;Walking    Patient Stated Goals find out what he needs to do to improve his balance    Currently in Pain? No/denies                 [facility] Adult PT Treatment/Exercise - 12/02/20 1025      Transfers   Transfers Sit to Stand;Stand to Sit    Sit to Stand 5: Supervision;Without upper extremity assist    Stand to Sit 5: Supervision;Without upper extremity assist      Ambulation/Gait   Ambulation/Gait Yes    Ambulation/Gait Assistance 5: Supervision    Ambulation/Gait Assistance Details around gym with session    Assistive device None    Gait Pattern Step-through pattern    Ambulation Surface Level;Indoor      Neuro Re-ed    Neuro Re-ed Details  for balance/muscle re-ed: gait along ~50 foot hallway with head movements left<>fwd<>right, up<>fwd<>left, 2 laps each. veering/staggering noted, no significant balance loss as pt able to self correct. Min guard assist for safety.               Balance Exercises - 12/02/20 1031      Balance Exercises: Standing   SLS with Vectors Foam/compliant surface;Other reps (comment);Limitations    SLS with Vectors Limitations standing on airex with 2 tall cones in front, no UE support: alternating forward foot taps, then cross foot taps for ~10 reps each. Cues for base of support and weight shifting to assist with balance. Min guard to occasional min assist needed.    Rockerboard Anterior/posterior;Lateral;Head turns;EO;EC;30 seconds;Other reps (comment);Limitations    Rockerboard Limitations performed both ways on balance board with no UE support: Rocking the board with EO, progressing to [facility] with emphasis on tall posture with min guard to min  assist. then holding the board steady for EC 30 sec's x 3 reps, progressing to EC head movements left<>right, up<>down for ~10 reps. up to min assist needed with cues on weight shifting as pt tended to lean posterior direction.               PT Short Term Goals - 10/28/20 1303      PT SHORT TERM GOAL #1   Title ALL STGS = LTGS             PT Long Term Goals - 11/29/20 1144      PT LONG TERM GOAL #1   Title Pt will independently perform HEP to maximize functional gains made in PT.  ALL LTGS DUE 12/16/20    Time 4    Period Weeks    Status New      PT LONG TERM GOAL #2   Title Pt will improve FGA score to at least a 24/30 in order to demo decr fall risk.    Baseline 19/30    Time 4    Period Weeks    Status New      PT LONG TERM GOAL #3   Title Pt will improve vestibular to >/= 50% on SOT to demonstrate improved vestibular input for balance.    Baseline approx 40%    Time 4    Period Weeks    Status Revised      PT LONG TERM GOAL #4   Title Pt will improve condition 4 of mCTSIB to at least 30 seconds in order to demo improved vestibular input for balance.    Baseline 17 seconds    Time 4    Period Weeks    Status New                 Plan - 12/02/20 1021    Clinical Impression Statement Today's skilled session continued to focus on dynamic gait and balance training on compliant surfaces with/without vision removed. No significant issues noted or reported. The pt did report mild lightheadedness at times with activity that resolved quickly with being still. The pt's BP was low to start with, dropped lower after hallway activites, however increased to WNL with corner balance/single leg stance activities. Will need to be monitored with sessions as MDs are changing meds around. The pt is progressing toward goals and should benefit from continued PT to progress toward unmet goals.    Personal Factors and Comorbidities Comorbidity 3+;Time since onset of  injury/illness/exacerbation;Past/Current Experience    Comorbidities diabetes mellitus w/  neuropathy, CVA (12/2014), prostate cancer, vertigo, HTN, CKD, a fib    Examination-Activity Limitations Stairs;Transfers;Locomotion Level    Examination-Participation Restrictions Community Activity    Stability/Clinical Decision Making Stable/Uncomplicated    Rehab Potential Excellent    PT Frequency 2x / week    PT Duration 4 weeks    PT Treatment/Interventions ADLs/Self Care Home Management;Gait training;Therapeutic activities;Functional mobility training;Stair training;Therapeutic exercise;Balance training;Neuromuscular re-education;Patient/family education;Vestibular    PT Next Visit Plan continue to work on balance on compliant surfaces with and without vision removed; dynamic gait/balance activites. Monitor BP with sessions.    Consulted and Agree with Plan of Care Patient           Patient will benefit from skilled therapeutic intervention in order to improve the following deficits and impairments:  Abnormal gait,Decreased balance,Difficulty walking,Decreased strength,Dizziness  Visit Diagnosis: Other abnormalities of gait and mobility  Unsteadiness on feet  Dizziness and giddiness     Problem List Patient Active Problem List   Diagnosis Date Noted  . Obstructive sleep apnea 06/20/2017  . Hyperlipidemia 04/17/2016  . Cerebrovascular accident (CVA) due to embolism of left middle cerebral artery ([place]) 04/17/2016  . Type 2 diabetes mellitus with circulatory disorder ([place]) 01/14/2016  . On amiodarone therapy 03/10/2015  . Stroke ([place]) 12/31/2014  . Dysarthria 12/30/2014  . Chronic systolic heart failure ([place]) 12/28/2014  . Coronary artery disease involving coronary bypass graft of native heart with angina pectoris ([place]) 02/02/2014  . Paroxysmal atrial fibrillation ([place]) 02/02/2014  . Essential hypertension 02/02/2014  . Chronic anticoagulation 02/02/2014   Willow Ora, PTA,  [facility] [address] [place], [place] [address] [phone] 12/02/20, 5:01 PM   Name: Walter [name] MRN: [id number] Date of Birth: [birth year]

## 2020-12-06 ENCOUNTER — Encounter: Payer: Self-pay | Admitting: Physical Therapy

## 2020-12-06 ENCOUNTER — Other Ambulatory Visit: Payer: Self-pay

## 2020-12-06 ENCOUNTER — Ambulatory Visit: Payer: Medicare HMO | Attending: Diagnostic Neuroimaging | Admitting: Physical Therapy

## 2020-12-06 VITALS — BP 120/76 | HR 59

## 2020-12-06 DIAGNOSIS — R2689 Other abnormalities of gait and mobility: Secondary | ICD-10-CM

## 2020-12-06 DIAGNOSIS — R42 Dizziness and giddiness: Secondary | ICD-10-CM | POA: Diagnosis present

## 2020-12-06 DIAGNOSIS — R2681 Unsteadiness on feet: Secondary | ICD-10-CM | POA: Diagnosis not present

## 2020-12-06 NOTE — Therapy (Signed)
[facility] [address] [place], [place], [address] Phone: [phone]   Fax:  [phone]  Physical Therapy Treatment  Patient Details  Name: Walter [name] MRN: [id number] Date of Birth: 11/16/1945 Referring Provider (PT): [name], MD   Encounter Date: 12/06/2020   PT End of Session - 12/06/20 1238    Visit Number 7    Number of Visits 9    Date for PT Re-Evaluation 12/27/20   written for 30 day POC   Authorization Type [employer]etna Medicare    PT Start Time 1146    PT Stop Time 1228    PT Time Calculation (min) 42 min    Equipment Utilized During Treatment Gait belt    Activity Tolerance Patient tolerated treatment well    Behavior During Therapy [facility] for tasks assessed/performed           Past Medical History:  Diagnosis Date  . Abdominal pain   . Atrial fibrillation ([place])   . Barrett's esophagus   . CAD (coronary artery disease)   . Cancer [facility])    prostate  . Chronic kidney disease   . Colon polyp   . CVA (cerebral vascular accident) ([place]) 12/2014  . Diabetes mellitus    w/neuropathy  . Gum disease   . Hiatal hernia   . Hypertension   . Kidney stone    hx of  . Numbness    hands, feet  . OSA on CPAP   . Paresthesia of bilateral legs   . Pleural effusion   . Prostate cancer [facility]) 2007   radiation tx [place]  . PTSD (post-traumatic stress disorder)   . Stroke ([place])   . Vertigo     Past Surgical History:  Procedure Laterality Date  . CATARACT EXTRACTION  2006 and 2007   bilateral  . COLON SURGERY  05/2011   hemicolectomy  . COLONOSCOPY    . CORONARY ARTERY BYPASS GRAFT  2004   x3    Vitals:   12/06/20 1152 12/06/20 1217  BP: (!) 101/58 120/76  Pulse: (!) 59      Subjective Assessment - 12/06/20 1151    Subjective Follows up with the cardiologist on Friday. Reports his dizziness is feeling better after having some meds asjusted.    Pertinent History diabetes mellitus w/  neuropathy, CVA (12/2014), prostate cancer, vertigo, HTN, CKD, a fib, CABG x3 (2004). 2 episodes of passing out in the last 3 years (being followed by cardiology for this)    Limitations Standing;Walking    Patient Stated Goals find out what he needs to do to improve his balance    Currently in Pain? No/denies                             [facility] Adult PT Treatment/Exercise - 12/06/20 0001      High Level Balance   High Level Balance Activities Backward walking;Head turns;Tandem walking;Marching forwards    High Level Balance Comments In hallway: completed all of the following x 3 laps down and back approx 50' each including backwards walking and marching forwards - pt with incr difficulty with SLS marching forwards, needing intermittent min A for balance.  walking with head turns and head nods down and back 3 reps.  forwards requiring intermittent touching from wall and CGA/min A to maintain balance. .               Balance Exercises -  12/06/20 0001      Balance Exercises: Standing   Standing Eyes Closed Narrow base of support (BOS);Head turns;Foam/compliant surface;   Standing Eyes Closed Limitations on blue air ex 2 x 30 seconds, head turns 2 x 5 reps with narrow BOS - intermittent touch to walls for balance    SLS with Vectors Foam/compliant surface;Other reps (comment);Limitations    SLS with Vectors Limitations standing on blue foam beam: x12 reps B alternating toe taps forward to cone, intermittent UE support on chair    Sidestepping 2 reps;Limitations    Sidestepping Limitations on blue balance beam: down and back x2 reps with focus on foot clearance when stepping, needing intermittent min guard/min A for balance    Other Standing Exercises Comments on blue air ex: x10 reps heel toe raises with no UE support               PT Short Term Goals - 10/28/20 1303      PT SHORT TERM GOAL #1   Title ALL STGS = LTGS             PT Long Term Goals -  11/29/20 1144      PT LONG TERM GOAL #1   Title Pt will independently perform HEP to maximize functional gains made in PT.  ALL LTGS DUE 12/16/20    Time 4    Period Weeks    Status New      PT LONG TERM GOAL #2   Title Pt will improve FGA score to at least a 24/30 in order to demo decr fall risk.    Baseline 19/30    Time 4    Period Weeks    Status New      PT LONG TERM GOAL #3   Title Pt will improve vestibular to >/= 50% on SOT to demonstrate improved vestibular input for balance.    Baseline approx 40%    Time 4    Period Weeks    Status Revised      PT LONG TERM GOAL #4   Title Pt will improve condition 4 of mCTSIB to at least 30 seconds in order to demo improved vestibular input for balance.    Baseline 17 seconds    Time 4    Period Weeks    Status New                 Plan - 12/06/20 1415    Clinical Impression Statement Today's skilled session continued to focus on dynamic gait and balance on compliant surfaces with/without vision removed. Pt with no reports of lightheadedness throughout session with balance activities. Pt most challenged today by dynamic SLS tasks such as marching, would need to use the wall intermittently for balance and required min A at times. Will continue to progress towards LTGs.    Personal Factors and Comorbidities Comorbidity 3+;Time since onset of injury/illness/exacerbation;Past/Current Experience    Comorbidities diabetes mellitus w/ neuropathy, CVA (12/2014), prostate cancer, vertigo, HTN, CKD, a fib    Examination-Activity Limitations Stairs;Transfers;Locomotion Level    Examination-Participation Restrictions Community Activity    Stability/Clinical Decision Making Stable/Uncomplicated    Rehab Potential Excellent    PT Frequency 2x / week    PT Duration 4 weeks    PT Treatment/Interventions ADLs/Self Care Home Management;Gait training;Therapeutic activities;Functional mobility training;Stair training;Therapeutic exercise;Balance  training;Neuromuscular re-education;Patient/family education;Vestibular    PT Next Visit Plan pt's POC only has 2 more visits included, but is scheduled for 3 more. continue  to work on balance on compliant surfaces with and without vision removed; dynamic gait/balance activites. Monitor BP with sessions.    Consulted and Agree with Plan of Care Patient           Patient will benefit from skilled therapeutic intervention in order to improve the following deficits and impairments:  Abnormal gait,Decreased balance,Difficulty walking,Decreased strength,Dizziness  Visit Diagnosis: Unsteadiness on feet  Other abnormalities of gait and mobility     Problem List Patient Active Problem List   Diagnosis Date Noted  . Obstructive sleep apnea 06/20/2017  . Hyperlipidemia 04/17/2016  . Cerebrovascular accident (CVA) due to embolism of left middle cerebral artery ([place]) 04/17/2016  . Type 2 diabetes mellitus with circulatory disorder ([place]) 01/14/2016  . On amiodarone therapy 03/10/2015  . Stroke ([place]) 12/31/2014  . Dysarthria 12/30/2014  . Chronic systolic heart failure ([place]) 12/28/2014  . Coronary artery disease involving coronary bypass graft of native heart with angina pectoris ([place]) 02/02/2014  . Paroxysmal atrial fibrillation ([place]) 02/02/2014  . Essential hypertension 02/02/2014  . Chronic anticoagulation 02/02/2014    Arliss Journey, PT, DPT  12/06/2020, 2:19 PM  [facility] [address] [place], [place], [address] Phone: [phone]   Fax:  [phone]  Name: Daemon Dowty MRN: [id number] Date of Birth: 02-Dec-1945

## 2020-12-08 ENCOUNTER — Other Ambulatory Visit: Payer: Self-pay

## 2020-12-08 ENCOUNTER — Ambulatory Visit: Payer: Medicare HMO

## 2020-12-08 VITALS — BP 127/74 | HR 63

## 2020-12-08 DIAGNOSIS — R2681 Unsteadiness on feet: Secondary | ICD-10-CM | POA: Diagnosis not present

## 2020-12-08 DIAGNOSIS — R42 Dizziness and giddiness: Secondary | ICD-10-CM

## 2020-12-08 DIAGNOSIS — R2689 Other abnormalities of gait and mobility: Secondary | ICD-10-CM

## 2020-12-08 NOTE — Therapy (Signed)
[facility] [address] [place], [place], [address] Phone: [phone]   Fax:  [phone]  Physical Therapy Treatment  Patient Details  Name: Maximillian Habibi MRN: [id number] Date of Birth: 03-21-1946 Referring Provider (PT): [name], MD   Encounter Date: 12/08/2020   PT End of Session - 12/08/20 1235    Visit Number 8    Number of Visits 9    Date for PT Re-Evaluation 12/27/20   written for 30 day POC   Authorization Type Aetna Medicare    PT Start Time 1233   patient arriving few minutes late   PT Stop Time 1313    PT Time Calculation (min) 40 min    Equipment Utilized During Treatment Gait belt    Activity Tolerance Patient tolerated treatment well    Behavior During Therapy [facility] for tasks assessed/performed           Past Medical History:  Diagnosis Date  . Abdominal pain   . Atrial fibrillation ([place])   . Barrett's esophagus   . CAD (coronary artery disease)   . Cancer [facility])    prostate  . Chronic kidney disease   . Colon polyp   . CVA (cerebral vascular accident) ([place]) 12/2014  . Diabetes mellitus    w/neuropathy  . Gum disease   . Hiatal hernia   . Hypertension   . Kidney stone    hx of  . Numbness    hands, feet  . OSA on CPAP   . Paresthesia of bilateral legs   . Pleural effusion   . Prostate cancer [facility]) 2007   radiation tx [place]  . PTSD (post-traumatic stress disorder)   . Stroke ([place])   . Vertigo     Past Surgical History:  Procedure Laterality Date  . CATARACT EXTRACTION  2006 and 2007   bilateral  . COLON SURGERY  05/2011   hemicolectomy  . COLONOSCOPY    . CORONARY ARTERY BYPASS GRAFT  2004   x3    Vitals:   12/08/20 1238  BP: 127/74  Pulse: 63     Subjective Assessment - 12/08/20 1235    Subjective Patient has another follow up with Cardiologist. Reports dizziness continues to be better.    Pertinent History diabetes mellitus w/ neuropathy, CVA (12/2014),  prostate cancer, vertigo, HTN, CKD, a fib, CABG x3 (2004). 2 episodes of passing out in the last 3 years (being followed by cardiology for this)    Limitations Standing;Walking    Patient Stated Goals find out what he needs to do to improve his balance    Currently in Pain? No/denies              [facility] Adult PT Treatment/Exercise - 12/08/20 0001      Ambulation/Gait   Ambulation/Gait Yes    Ambulation/Gait Assistance 6: Modified independent (Device/Increase time);5: Supervision    Ambulation/Gait Assistance Details completed ambulation on various outdoor unlevel surfaces. Mod I on paved surfaces, but close supervision on grass surfaces. Completed ambulation x 1200 ft.    Ambulation Distance (Feet) 1200 Feet    Assistive device None    Gait Pattern Step-through pattern    Ambulation Surface Level;Indoor;Unlevel;Outdoor;Paved;Grass      High Level Balance   High Level Balance Activities Side stepping;Backward walking;Head turns;Marching forwards    High Level Balance Comments outdoors on grass surfaces completed all of the following balance activities including sidestepping, backwards walking, amublation with hroizontal/vertical  head turns, and marching forwards.  Completed each of the following 2 x 50'. Increased challenge noted with vertical head turns and marching forward requireing intermittent CGA. Verbal cues for slowed paced to promote improved SLS.               Balance Exercises - 12/08/20 0001      Balance Exercises: Standing   Tandem Stance Eyes open;3 reps;30 secs;Limitations    Tandem Stance Time completed 3 x 30 secs full tandem on firm surface without UE support; completed partial tandem with addition of horizontal/vertical head turns x 10 reps each bilaterally.    Stepping Strategy Anterior;Posterior;Foam/compliant surface;Limitations    Stepping Strategy Limitations on blue balance beam without UE support completed anterior/posterior stepping strategy x 10 reps  bilerally. Increased challenge and intermittent UE touchA from // bars with posterior stepping. PT providing vebral cues for proper completion and improved control.    Rockerboard Anterior/posterior;Lateral;Head turns;EO;Intermittent UE support;Limitations    Rockerboard Limitations performed with board both ways x 15 reps PT providing A/P and lateral perturbations with patient focusing on holding board steady during completion. intermittent UE support required to maintain balance, and one instance of CGA from PT. Standing with board both ways completed vertical/horizontal head turns x 10 reps each. Incrased challenge iwth board positioned laterally > A/P.               PT Short Term Goals - 10/28/20 1303      PT SHORT TERM GOAL #1   Title ALL STGS = LTGS             PT Long Term Goals - 11/29/20 1144      PT LONG TERM GOAL #1   Title Pt will independently perform HEP to maximize functional gains made in PT.  ALL LTGS DUE 12/16/20    Time 4    Period Weeks    Status New      PT LONG TERM GOAL #2   Title Pt will improve FGA score to at least a 24/30 in order to demo decr fall risk.    Baseline 19/30    Time 4    Period Weeks    Status New      PT LONG TERM GOAL #3   Title Pt will improve vestibular to >/= 50% on SOT to demonstrate improved vestibular input for balance.    Baseline approx 40%    Time 4    Period Weeks    Status Revised      PT LONG TERM GOAL #4   Title Pt will improve condition 4 of mCTSIB to at least 30 seconds in order to demo improved vestibular input for balance.    Baseline 17 seconds    Time 4    Period Weeks    Status New                 Plan - 12/08/20 1325    Clinical Impression Statement Completed ambulation and high level balance on outdoor unlevel surfaces today including pavement and grass. Intemrittent balance challenge noted with marching and head turns on grass surfaces. continued balance activites indoor working toward  improved stability with head turns and perturbations. Will continue to progress toward all LTGs.    Personal Factors and Comorbidities Comorbidity 3+;Time since onset of injury/illness/exacerbation;Past/Current Experience    Comorbidities diabetes mellitus w/ neuropathy, CVA (12/2014), prostate cancer, vertigo, HTN, CKD, a fib    Examination-Activity Limitations Stairs;Transfers;Locomotion Level    Examination-Participation Restrictions Community Activity    Stability/Clinical  Decision Making Stable/Uncomplicated    Rehab Potential Excellent    PT Frequency 2x / week    PT Duration 4 weeks    PT Treatment/Interventions ADLs/Self Care Home Management;Gait training;Therapeutic activities;Functional mobility training;Stair training;Therapeutic exercise;Balance training;Neuromuscular re-education;Patient/family education;Vestibular    PT Next Visit Plan check LTGs.  continue to work on balance on compliant surfaces with and without vision removed; dynamic gait/balance activites. Monitor BP with sessions.    Consulted and Agree with Plan of Care Patient           Patient will benefit from skilled therapeutic intervention in order to improve the following deficits and impairments:  Abnormal gait,Decreased balance,Difficulty walking,Decreased strength,Dizziness  Visit Diagnosis: Unsteadiness on feet  Other abnormalities of gait and mobility  Dizziness and giddiness     Problem List Patient Active Problem List   Diagnosis Date Noted  . Obstructive sleep apnea 06/20/2017  . Hyperlipidemia 04/17/2016  . Cerebrovascular accident (CVA) due to embolism of left middle cerebral artery ([place]) 04/17/2016  . Type 2 diabetes mellitus with circulatory disorder ([place]) 01/14/2016  . On amiodarone therapy 03/10/2015  . Stroke ([place]) 12/31/2014  . Dysarthria 12/30/2014  . Chronic systolic heart failure ([place]) 12/28/2014  . Coronary artery disease involving coronary bypass graft of native heart with angina  pectoris ([place]) 02/02/2014  . Paroxysmal atrial fibrillation ([place]) 02/02/2014  . Essential hypertension 02/02/2014  . Chronic anticoagulation 02/02/2014    Jones Bales, PT, DPT 12/08/2020, 1:27 PM  [facility] [address] [place], [place], [address] Phone: [phone]   Fax:  [phone]  Name: Shloima Clinch MRN: [id number] Date of Birth: 17-Feb-1946

## 2020-12-09 ENCOUNTER — Other Ambulatory Visit: Payer: Medicare HMO

## 2020-12-09 ENCOUNTER — Ambulatory Visit: Payer: Medicare HMO | Admitting: Pharmacist

## 2020-12-09 NOTE — Progress Notes (Unsigned)
Patient ID: Jeremaih [name]                 DOB: 1946-01-09                      MRN: [id number]     HPI: Walter [name] is a 75 y.o. male referred by Dr. Tamala Julian to HTN clinic. PMH is significant for paroxysmal atrial fibrillation, CAD with prior CABG, HTN, HLD, CKD 2, T2DM, OSA on CPAP, chronic anticoagulation therapy, andprior embolic CVA.  BP at last visit with Dr. Tamala Julian on 11/30/20 was low at 75/52. Patient reported dizziness and fatigue. This was thought to be due to recent addition of Farxiga 10 mg daily (T2DM) to metoprolol succinate and lisinopril. Lisinopril was held at that time and metoprolol succinate decreased from 75 mg/day to 50 mg/day. Per chart review, it appears BP has since improved to 120s/70s with HR mostly unchanged in the 60s. Of note, patient has an allergy listed to [facility] for orthostatic hypotension.   ***Stopped lisinopril/decr metop? Taken meds today? Dizziness/fatigue? Needs bmet today (ordered), could consider resuming lisin2.5 or 5 (CKD/T2DM) if BP improved on lower dose of BB  Current HTN meds: metoprolol succinate 50 mg daily Previously tried: lisinopril 20 mg daily (discontinued due to hypotension) BP goal: <130/80  Family History: Angina in his mother; Coronary artery disease in his father; Diabetes in his father; Stroke in his father.  Social History: Former smoker (26 years)  Diet:   Exercise:   Home BP readings:   Wt Readings from Last 3 Encounters:  11/30/20 199 lb (90.3 kg)  09/23/20 205 lb 3.2 oz (93.1 kg)  05/18/20 202 lb (91.6 kg)   BP Readings from Last 3 Encounters:  12/08/20 127/74  12/06/20 120/76  12/02/20 114/65   Pulse Readings from Last 3 Encounters:  12/08/20 63  12/06/20 (!) 59  12/02/20 62   Renal function: CrCl cannot be calculated (Patient's most recent lab result is older than the maximum 21 days allowed.).  Past Medical History:  Diagnosis Date  . Abdominal pain   . Atrial fibrillation ([place])   . Barrett's esophagus    . CAD (coronary artery disease)   . Cancer [facility])    prostate  . Chronic kidney disease   . Colon polyp   . CVA (cerebral vascular accident) ([place]) 12/2014  . Diabetes mellitus    w/neuropathy  . Gum disease   . Hiatal hernia   . Hypertension   . Kidney stone    hx of  . Numbness    hands, feet  . OSA on CPAP   . Paresthesia of bilateral legs   . Pleural effusion   . Prostate cancer [facility]) 2007   radiation tx [place]  . PTSD (post-traumatic stress disorder)   . Stroke ([place])   . Vertigo     Current Outpatient Medications on File Prior to Visit  Medication Sig Dispense Refill  . acetaminophen (TYLENOL) 325 MG tablet Take 650 mg by mouth every 6 (six) hours as needed.    Marland Kitchen amiodarone (PACERONE) 200 MG tablet Take 0.5 tablets (100 mg total) by mouth daily. 45 tablet 2  . buPROPion (WELLBUTRIN XL) 150 MG 24 hr tablet Take 150 mg by mouth daily.  0  . calcium carbonate (TUMS - DOSED IN MG ELEMENTAL CALCIUM) 500 MG chewable tablet Chew 1 tablet by mouth daily.    . Continuous Blood Gluc Receiver ([place]) DEVI by Does not apply route.    Marland Kitchen  Continuous Blood Gluc Sensor (DEXCOM G6 SENSOR) MISC by Does not apply route.    . Continuous Blood Gluc Transmit (DEXCOM G6 TRANSMITTER) MISC See admin instructions.    . dapagliflozin propanediol (FARXIGA) 10 MG TABS tablet Take by mouth daily.    Marland Kitchen escitalopram (LEXAPRO) 20 MG tablet Take 20 mg by mouth daily.    Marland Kitchen esomeprazole (NEXIUM) 40 MG capsule Take 40 mg by mouth daily at 12 noon.    . Ferrous Sulfate (IRON PO) Take 1 tablet by mouth daily.    Marland Kitchen levocetirizine (XYZAL) 5 MG tablet Take 5 mg by mouth daily.    Marland Kitchen levothyroxine (SYNTHROID) 25 MCG tablet Take 25 mcg by mouth daily.    . metoprolol succinate (TOPROL-XL) 50 MG 24 hr tablet Take 1 tablet (50 mg total) by mouth daily. Take with or immediately following a meal. 90 tablet 3  . Omega-3-acid Ethyl Esters (LOVAZA PO) Take 1 tablet by mouth 2 (two) times daily.    . polyethylene  glycol (MIRALAX / GLYCOLAX) 17 g packet Take 17 g by mouth as needed.    . rosuvastatin (CRESTOR) 10 MG tablet Take 10 mg by mouth at bedtime.    . tamsulosin (FLOMAX) 0.4 MG CAPS capsule Take 0.4 mg by mouth daily.    . TOVIAZ 8 MG TB24 tablet Take 8 mg by mouth daily.    . TRESIBA FLEXTOUCH 200 UNIT/ML SOPN Inject 80 Units into the skin daily.    . TRULICITY 8.92 JJ/9.4RD SOPN Inject 0.75 mg into the skin once a week.    Alveda Reasons 20 MG TABS tablet TAKE 1 TABLET DAILY 90 tablet 1   No current facility-administered medications on file prior to visit.    Allergies  Allergen Reactions  . Aspirin Other (See Comments)  . Byetta 10 Mcg Pen [Exenatide] Nausea Only  . Invokana [Canagliflozin] Other (See Comments)    Orthostatic hypotension  . Levemir [Insulin Detemir] Other (See Comments)    Burning at injection site  . Nsaids   . Short Ragweed Pollen Ext Cough    Assessment/Plan:  1. Hypertension - BP is ***above/below goal <130/80.

## 2020-12-11 ENCOUNTER — Observation Stay (HOSPITAL_COMMUNITY)
Admission: EM | Admit: 2020-12-11 | Discharge: 2020-12-12 | Disposition: A | Discharge status: Home / Self Care | Payer: Medicare HMO | Attending: Internal Medicine | Admitting: Internal Medicine

## 2020-12-11 ENCOUNTER — Other Ambulatory Visit: Payer: Self-pay

## 2020-12-11 ENCOUNTER — Encounter (HOSPITAL_COMMUNITY): Payer: Self-pay | Admitting: Emergency Medicine

## 2020-12-11 DIAGNOSIS — E039 Hypothyroidism, unspecified: Secondary | ICD-10-CM | POA: Insufficient documentation

## 2020-12-11 DIAGNOSIS — I10 Essential (primary) hypertension: Secondary | ICD-10-CM | POA: Diagnosis present

## 2020-12-11 DIAGNOSIS — Z20822 Contact with and (suspected) exposure to covid-19: Secondary | ICD-10-CM | POA: Diagnosis not present

## 2020-12-11 DIAGNOSIS — I480 Paroxysmal atrial fibrillation: Secondary | ICD-10-CM | POA: Diagnosis present

## 2020-12-11 DIAGNOSIS — N189 Chronic kidney disease, unspecified: Secondary | ICD-10-CM | POA: Diagnosis not present

## 2020-12-11 DIAGNOSIS — Z79890 Hormone replacement therapy (postmenopausal): Secondary | ICD-10-CM | POA: Diagnosis not present

## 2020-12-11 DIAGNOSIS — I130 Hypertensive heart and chronic kidney disease with heart failure and stage 1 through stage 4 chronic kidney disease, or unspecified chronic kidney disease: Secondary | ICD-10-CM | POA: Insufficient documentation

## 2020-12-11 DIAGNOSIS — K921 Melena: Principal | ICD-10-CM | POA: Diagnosis present

## 2020-12-11 DIAGNOSIS — Z8249 Family history of ischemic heart disease and other diseases of the circulatory system: Secondary | ICD-10-CM | POA: Diagnosis not present

## 2020-12-11 DIAGNOSIS — Z951 Presence of aortocoronary bypass graft: Secondary | ICD-10-CM | POA: Diagnosis not present

## 2020-12-11 DIAGNOSIS — Z794 Long term (current) use of insulin: Secondary | ICD-10-CM | POA: Diagnosis not present

## 2020-12-11 DIAGNOSIS — Z79899 Other long term (current) drug therapy: Secondary | ICD-10-CM | POA: Insufficient documentation

## 2020-12-11 DIAGNOSIS — I639 Cerebral infarction, unspecified: Secondary | ICD-10-CM | POA: Diagnosis present

## 2020-12-11 DIAGNOSIS — Z8673 Personal history of transient ischemic attack (TIA), and cerebral infarction without residual deficits: Secondary | ICD-10-CM | POA: Diagnosis not present

## 2020-12-11 DIAGNOSIS — Z886 Allergy status to analgesic agent status: Secondary | ICD-10-CM | POA: Insufficient documentation

## 2020-12-11 DIAGNOSIS — K922 Gastrointestinal hemorrhage, unspecified: Secondary | ICD-10-CM | POA: Diagnosis not present

## 2020-12-11 DIAGNOSIS — R42 Dizziness and giddiness: Secondary | ICD-10-CM | POA: Diagnosis not present

## 2020-12-11 DIAGNOSIS — E1151 Type 2 diabetes mellitus with diabetic peripheral angiopathy without gangrene: Secondary | ICD-10-CM

## 2020-12-11 DIAGNOSIS — I5022 Chronic systolic (congestive) heart failure: Secondary | ICD-10-CM | POA: Diagnosis not present

## 2020-12-11 DIAGNOSIS — Z833 Family history of diabetes mellitus: Secondary | ICD-10-CM | POA: Diagnosis not present

## 2020-12-11 DIAGNOSIS — Z87891 Personal history of nicotine dependence: Secondary | ICD-10-CM | POA: Diagnosis not present

## 2020-12-11 DIAGNOSIS — E1122 Type 2 diabetes mellitus with diabetic chronic kidney disease: Secondary | ICD-10-CM | POA: Diagnosis not present

## 2020-12-11 DIAGNOSIS — I63133 Cerebral infarction due to embolism of bilateral carotid arteries: Secondary | ICD-10-CM

## 2020-12-11 DIAGNOSIS — Z7901 Long term (current) use of anticoagulants: Secondary | ICD-10-CM

## 2020-12-11 DIAGNOSIS — R531 Weakness: Secondary | ICD-10-CM | POA: Diagnosis not present

## 2020-12-11 DIAGNOSIS — I2510 Atherosclerotic heart disease of native coronary artery without angina pectoris: Secondary | ICD-10-CM | POA: Diagnosis not present

## 2020-12-11 DIAGNOSIS — E1159 Type 2 diabetes mellitus with other circulatory complications: Secondary | ICD-10-CM | POA: Diagnosis not present

## 2020-12-11 LAB — CBC
HCT: 42.8 % (ref 39.0–52.0)
Hemoglobin: 15 g/dL (ref 13.0–17.0)
MCH: 31.6 pg (ref 26.0–34.0)
MCHC: 35 g/dL (ref 30.0–36.0)
MCV: 90.3 fL (ref 80.0–100.0)
Platelets: 222 10*3/uL (ref 150–400)
RBC: 4.74 MIL/uL (ref 4.22–5.81)
RDW: 13.1 % (ref 11.5–15.5)
WBC: 8.3 10*3/uL (ref 4.0–10.5)
nRBC: 0 % (ref 0.0–0.2)

## 2020-12-11 LAB — COMPREHENSIVE METABOLIC PANEL
ALT: 37 U/L (ref 0–44)
AST: 35 U/L (ref 15–41)
Albumin: 3.3 g/dL — ABNORMAL LOW (ref 3.5–5.0)
Alkaline Phosphatase: 34 U/L — ABNORMAL LOW (ref 38–126)
Anion gap: 12 (ref 5–15)
BUN: 33 mg/dL — ABNORMAL HIGH (ref 8–23)
CO2: 23 mmol/L (ref 22–32)
Calcium: 9.2 mg/dL (ref 8.9–10.3)
Chloride: 104 mmol/L (ref 98–111)
Creatinine, Ser: 1.83 mg/dL — ABNORMAL HIGH (ref 0.61–1.24)
GFR, Estimated: 38 mL/min — ABNORMAL LOW (ref >60)
Glucose, Bld: 145 mg/dL — ABNORMAL HIGH (ref 70–99)
Potassium: 4.3 mmol/L (ref 3.5–5.1)
Sodium: 139 mmol/L (ref 135–145)
Total Bilirubin: 1.4 mg/dL — ABNORMAL HIGH (ref 0.3–1.2)
Total Protein: 6.9 g/dL (ref 6.5–8.1)

## 2020-12-11 LAB — TYPE AND SCREEN
ABO/RH(D): B POS
Antibody Screen: NEGATIVE

## 2020-12-11 LAB — PROTIME-INR
INR: 1.7 — ABNORMAL HIGH (ref 0.8–1.2)
Prothrombin Time: 19.3 seconds — ABNORMAL HIGH (ref 11.4–15.2)

## 2020-12-11 LAB — GLUCOSE, CAPILLARY
Glucose-Capillary: 118 mg/dL — ABNORMAL HIGH (ref 70–99)
Glucose-Capillary: 161 mg/dL — ABNORMAL HIGH (ref 70–99)
Glucose-Capillary: 97 mg/dL (ref 70–99)

## 2020-12-11 LAB — SARS CORONAVIRUS 2 (TAT 6-24 HRS): SARS Coronavirus 2: NEGATIVE

## 2020-12-11 LAB — HEMOGLOBIN A1C
Hgb A1c MFr Bld: 6.8 % — ABNORMAL HIGH (ref 4.8–5.6)
Mean Plasma Glucose: 148.46 mg/dL

## 2020-12-11 LAB — POC OCCULT BLOOD, ED: Fecal Occult Bld: POSITIVE — AB

## 2020-12-11 LAB — CBG MONITORING, ED: Glucose-Capillary: 138 mg/dL — ABNORMAL HIGH (ref 70–99)

## 2020-12-11 LAB — HEMOGLOBIN AND HEMATOCRIT, BLOOD
HCT: 41.1 % (ref 39.0–52.0)
Hemoglobin: 14 g/dL (ref 13.0–17.0)

## 2020-12-11 MED ORDER — RIVAROXABAN 20 MG PO TABS
20.0000 mg | ORAL_TABLET | Freq: Every day | ORAL | Status: DC
  Administered 2020-12-11: 20 mg via ORAL
  Filled 2020-12-11: qty 1

## 2020-12-11 MED ORDER — ESCITALOPRAM OXALATE 20 MG PO TABS
20.0000 mg | ORAL_TABLET | Freq: Every day | ORAL | Status: DC
  Administered 2020-12-11 – 2020-12-12 (×2): 20 mg via ORAL
  Filled 2020-12-11 (×2): qty 1

## 2020-12-11 MED ORDER — PANTOPRAZOLE SODIUM 40 MG PO TBEC
80.0000 mg | DELAYED_RELEASE_TABLET | Freq: Every day | ORAL | Status: DC
  Administered 2020-12-11 – 2020-12-12 (×2): 80 mg via ORAL
  Filled 2020-12-11 (×2): qty 2

## 2020-12-11 MED ORDER — AMIODARONE HCL 100 MG PO TABS
100.0000 mg | ORAL_TABLET | Freq: Every day | ORAL | Status: DC
  Administered 2020-12-11 – 2020-12-12 (×2): 100 mg via ORAL
  Filled 2020-12-11 (×2): qty 1

## 2020-12-11 MED ORDER — INSULIN GLARGINE 100 UNIT/ML ~~LOC~~ SOLN
40.0000 [IU] | Freq: Every day | SUBCUTANEOUS | Status: DC
  Administered 2020-12-11: 40 [IU] via SUBCUTANEOUS
  Filled 2020-12-11: qty 0.4

## 2020-12-11 MED ORDER — ONDANSETRON HCL 4 MG PO TABS: 4.0000 mg | ORAL_TABLET | Freq: Four times a day (QID) | ORAL | Status: DC | PRN

## 2020-12-11 MED ORDER — ACETAMINOPHEN 325 MG PO TABS: 650.0000 mg | ORAL_TABLET | Freq: Four times a day (QID) | ORAL | Status: DC | PRN

## 2020-12-11 MED ORDER — LACTATED RINGERS IV SOLN: INTRAVENOUS | Status: DC

## 2020-12-11 MED ORDER — INSULIN ASPART 100 UNIT/ML ~~LOC~~ SOLN
0.0000 [IU] | SUBCUTANEOUS | Status: DC
Start: 2020-12-11 — End: 2020-12-12
  Administered 2020-12-11 (×2): 2 [IU] via SUBCUTANEOUS
  Administered 2020-12-11: 3 [IU] via SUBCUTANEOUS
  Administered 2020-12-12 (×2): 2 [IU] via SUBCUTANEOUS
  Filled 2020-12-11: qty 0.15

## 2020-12-11 MED ORDER — ROSUVASTATIN CALCIUM 10 MG PO TABS
10.0000 mg | ORAL_TABLET | Freq: Every day | ORAL | Status: DC
Start: 2020-12-11 — End: 2020-12-12
  Administered 2020-12-11: 10 mg via ORAL
  Filled 2020-12-11: qty 1

## 2020-12-11 MED ORDER — BUPROPION HCL ER (XL) 150 MG PO TB24
150.0000 mg | ORAL_TABLET | Freq: Every day | ORAL | Status: DC
  Administered 2020-12-11 – 2020-12-12 (×2): 150 mg via ORAL
  Filled 2020-12-11 (×2): qty 1

## 2020-12-11 MED ORDER — METOPROLOL SUCCINATE ER 50 MG PO TB24
50.0000 mg | ORAL_TABLET | Freq: Every day | ORAL | Status: DC
  Administered 2020-12-11 – 2020-12-12 (×2): 50 mg via ORAL
  Filled 2020-12-11 (×3): qty 1

## 2020-12-11 MED ORDER — LEVOTHYROXINE SODIUM 25 MCG PO TABS
25.0000 ug | ORAL_TABLET | Freq: Every day | ORAL | Status: DC
  Administered 2020-12-11 – 2020-12-12 (×2): 25 ug via ORAL
  Filled 2020-12-11 (×2): qty 1

## 2020-12-11 MED ORDER — OMEGA-3-ACID ETHYL ESTERS 1 G PO CAPS
1.0000 g | ORAL_CAPSULE | Freq: Two times a day (BID) | ORAL | Status: DC
  Administered 2020-12-11 – 2020-12-12 (×3): 1 g via ORAL
  Filled 2020-12-11 (×3): qty 1

## 2020-12-11 MED ORDER — ONDANSETRON HCL 4 MG/2ML IJ SOLN: 4.0000 mg | Freq: Four times a day (QID) | INTRAMUSCULAR | Status: DC | PRN

## 2020-12-11 MED ORDER — LORATADINE 10 MG PO TABS
10.0000 mg | ORAL_TABLET | Freq: Every day | ORAL | Status: DC
  Administered 2020-12-11 – 2020-12-12 (×2): 10 mg via ORAL
  Filled 2020-12-11 (×2): qty 1

## 2020-12-11 MED ORDER — ACETAMINOPHEN 650 MG RE SUPP: 650.0000 mg | Freq: Four times a day (QID) | RECTAL | Status: DC | PRN

## 2020-12-11 MED ORDER — FESOTERODINE FUMARATE ER 8 MG PO TB24
8.0000 mg | ORAL_TABLET | Freq: Every day | ORAL | Status: DC
  Administered 2020-12-11: 8 mg via ORAL
  Filled 2020-12-11: qty 1

## 2020-12-11 NOTE — Consult Note (Signed)
Referring Provider: [facility] Primary Care Physician:  Lavone Orn, MD Primary Gastroenterologist:  Dr. Cristina Gong  Reason for Consultation: Melenic, heme positive stool  HPI: Walter [name] is a 75 y.o. male came into the emergency room and was admitted early this morning because of a several day history of nonbloody nausea and vomiting, and diarrhea.  The patient has had dark stools for some time, on an iron supplement.    In the emergency room, he was found to be Hemoccult positive, but his hemoglobin was normal at 15, which is actually higher than his baseline from 2 years ago when it was 13.5.  He has had no further nausea, vomiting, or diarrhea since being admitted to the hospital, and he states he is hungry.  He has been tolerating clear liquids.  He is maintained on Xarelto with history of CVA (occurred while off Xarelto 1 day several years ago).   He is status post right hemicolectomy 2012 for an advanced proximal colonic adenoma, and has a history of short segment Barrett's esophagus, most recent endoscopy and colonoscopy November 2015 normal (biopsies negative for intestinal metaplasia in the distal esophagus; colon status post right hemicolectomy).    When I last saw the patient in the office about 6 months ago, he declined to have updated endoscopy for Barrett's esophagus dysplasia surveillance or updated colonoscopy for polyp surveillance, in part because of concerns of going off his anticoagulation for those procedures.  The patient admits that he is not using his Nexium on any regular basis, because he hardly ever needs it for symptom control.  He denies use of ulcerogenic medications such as aspirin or nonsteroidal anti-inflammatory drugs.   Past Medical History:  Diagnosis Date  . Abdominal pain   . Atrial fibrillation ([place])   . Barrett's esophagus   . CAD (coronary artery disease)   . Cancer [facility])    prostate  . Chronic kidney disease   . Colon polyp   . CVA  (cerebral vascular accident) ([place]) 12/2014  . Diabetes mellitus    w/neuropathy  . Gum disease   . Hiatal hernia   . Hypertension   . Kidney stone    hx of  . Numbness    hands, feet  . OSA on CPAP   . Paresthesia of bilateral legs   . Pleural effusion   . Prostate cancer [facility]) 2007   radiation tx [place]  . PTSD (post-traumatic stress disorder)   . Stroke ([place])   . Vertigo     Past Surgical History:  Procedure Laterality Date  . CATARACT EXTRACTION  2006 and 2007   bilateral  . COLON SURGERY  05/2011   hemicolectomy  . COLONOSCOPY    . CORONARY ARTERY BYPASS GRAFT  2004   x3    Prior to Admission medications   Medication Sig Start Date End Date Taking? Authorizing Provider  acetaminophen (TYLENOL) 325 MG tablet Take 650 mg by mouth every 6 (six) hours as needed.    [provider]  amiodarone (PACERONE) 200 MG tablet Take 0.5 tablets (100 mg total) by mouth daily. 10/26/20   Belva Crome, MD  buPROPion (WELLBUTRIN XL) 150 MG 24 hr tablet Take 150 mg by mouth daily. 10/29/14   [provider]  calcium carbonate (TUMS - DOSED IN MG ELEMENTAL CALCIUM) 500 MG chewable tablet Chew 1 tablet by mouth daily.    [provider]  Continuous Blood Gluc Receiver ([place]) [place] by Does not apply route.  [provider]  Continuous Blood Gluc Sensor (DEXCOM G6 SENSOR) MISC by Does not apply route.    [provider]  Continuous Blood Gluc Transmit (DEXCOM G6 TRANSMITTER) MISC See admin instructions. 10/29/20   [provider]  dapagliflozin propanediol (FARXIGA) 10 MG TABS tablet Take by mouth daily.    [provider]  escitalopram (LEXAPRO) 20 MG tablet Take 20 mg by mouth daily.    [provider]  esomeprazole (NEXIUM) 40 MG capsule Take 40 mg by mouth daily at 12 noon.    [provider]  Ferrous Sulfate (IRON PO) Take 1 tablet by mouth daily.    [provider]  levocetirizine  (XYZAL) 5 MG tablet Take 5 mg by mouth daily. 11/12/18   [provider]  levothyroxine (SYNTHROID) 25 MCG tablet Take 25 mcg by mouth daily. 11/01/20   [provider]  metoprolol succinate (TOPROL-XL) 50 MG 24 hr tablet Take 1 tablet (50 mg total) by mouth daily. Take with or immediately following a meal. 11/30/20   Belva Crome, MD  Omega-3-acid Ethyl Esters (LOVAZA PO) Take 1 tablet by mouth 2 (two) times daily.    [provider]  polyethylene glycol (MIRALAX / GLYCOLAX) 17 g packet Take 17 g by mouth as needed.    [provider]  rosuvastatin (CRESTOR) 10 MG tablet Take 10 mg by mouth at bedtime.    [provider]  tamsulosin (FLOMAX) 0.4 MG CAPS capsule Take 0.4 mg by mouth daily.    [provider]  TOVIAZ 8 MG TB24 tablet Take 8 mg by mouth daily. 04/27/16   [provider]  TRESIBA FLEXTOUCH 200 UNIT/ML SOPN Inject 80 Units into the skin daily. 11/11/15   [provider]  TRULICITY 6.28 BT/5.1VO SOPN Inject 0.75 mg into the skin once a week. 10/19/20   [provider]  XARELTO 20 MG TABS tablet TAKE 1 TABLET DAILY 08/29/20   Belva Crome, MD    Current Facility-Administered Medications  Medication Dose Route Frequency Provider Last Rate Last Admin  . acetaminophen (TYLENOL) tablet 650 mg  650 mg Oral Q6H PRN Etta Quill, DO       Or  . acetaminophen (TYLENOL) suppository 650 mg  650 mg Rectal Q6H PRN Etta Quill, DO      . amiodarone (PACERONE) tablet 100 mg  100 mg Oral Daily Alcario Drought, Jared M, DO      . buPROPion (WELLBUTRIN XL) 24 hr tablet 150 mg  150 mg Oral Daily Alcario Drought, Jared M, DO      . escitalopram (LEXAPRO) tablet 20 mg  20 mg Oral Daily Alcario Drought, Jared M, DO      . fesoterodine (TOVIAZ) tablet 8 mg  8 mg Oral Daily Alcario Drought, Jared M, DO      . insulin aspart (novoLOG) injection 0-15 Units  0-15 Units Subcutaneous Q4H Etta Quill, DO   2 Units at 12/11/20 0455  . insulin degludec  (TRESIBA) 100 UNIT/ML FlexTouch Pen 40 Units  40 Units Subcutaneous Q2200 Jennette Kettle M, DO      . lactated ringers infusion   Intravenous Continuous Etta Quill, DO 125 mL/hr at 12/11/20 0500 New Bag at 12/11/20 0500  . levocetirizine (XYZAL) tablet 5 mg  5 mg Oral Daily Alcario Drought, Jared M, DO      . levothyroxine (SYNTHROID) tablet 25 mcg  25 mcg Oral Daily Alcario Drought, Jared M, DO      . metoprolol succinate (TOPROL-XL)  24 hr tablet 50 mg  50 mg Oral Daily Jennette Kettle M, DO      . omega-3 acid ethyl esters (LOVAZA) capsule 1 g  1 g Oral BID Jennette Kettle M, DO      . ondansetron [facility]) tablet 4 mg  4 mg Oral Q6H PRN Etta Quill, DO       Or  . ondansetron [facility]) injection 4 mg  4 mg Intravenous Q6H PRN Etta Quill, DO      . pantoprazole (PROTONIX) EC tablet 80 mg  80 mg Oral Q1200 Etta Quill, DO      . rosuvastatin (CRESTOR) tablet 10 mg  10 mg Oral QHS Etta Quill, DO        Allergies as of 12/11/2020 - Review Complete 12/11/2020  Allergen Reaction Noted  . Aspirin Other (See Comments) 11/15/2020  . Byetta 10 mcg pen [exenatide] Nausea Only 09/20/2020  . Invokana [canagliflozin] Other (See Comments) 09/20/2020  . Levemir [insulin detemir] Other (See Comments) 09/20/2020  . Nsaids  09/20/2020  . Short ragweed pollen ext Cough 12/24/2019    Family History  Problem Relation Age of Onset  . Angina Mother   . Diabetes Father   . Coronary artery disease Father   . Stroke Father     Social History   Socioeconomic History  . Marital status: Married    Spouse name: Khori Rosevear  . Number of children: 2  . Years of education: BFA  . Highest education level: Not on file  Occupational History  . Occupation: Retired     Comment: retired Engineer, technical sales  Tobacco Use  . Smoking status: Former Smoker    Years: 26.00    Types: E-cigarettes  . Smokeless tobacco: Never Used  Vaping Use  . Vaping Use: Never used  Substance and Sexual Activity  . Alcohol use: Yes     Alcohol/week: 3.0 standard drinks    Types: 1 Glasses of wine, 1 Cans of beer, 1 Shots of liquor per week    Comment: social  . Drug use: Yes    Types: Marijuana    Comment: frequently--THC  . Sexual activity: Not on file  Other Topics Concern  . Not on file  Social History Narrative   Left handed    Lives with wife   Caffeine -2 cups per day   Social Determinants of Health   Financial Resource Strain: Not on file  Food Insecurity: Not on file  Transportation Needs: Not on file  Physical Activity: Not on file  Stress: Not on file  Social Connections: Not on file  Intimate Partner Violence: Not on file    Review of Systems: See HPI  Physical Exam: Vital signs in last 24 hours: Temp:  [98.3 F (36.8 C)] 98.3 F (36.8 C) (03/06 0710) Pulse Rate:  [63-105] 101 (03/06 0710) Resp:  [15-17] 16 (03/06 0710) BP: (79-124)/(39-74) 119/74 (03/06 0710) SpO2:  [90 %-97 %] 97 % (03/06 0710) Weight:  [90.7 kg] 90.7 kg (03/06 0224)   General:   Alert,  Well-developed, well-nourished, pleasant and cooperative in NAD Head:  Normocephalic and atraumatic. Eyes:  Sclera clear, no icterus.   Conjunctiva pink. Mouth:   No ulcerations or lesions.  Oropharynx pink & moist. Lungs:  Clear throughout to auscultation.   No wheezes, crackles, or rhonchi. No evident respiratory distress. Heart:   Slightly irregular rhythm consistent with atrial fibrillation. Abdomen: Moderately adipose, no mass-effect, guarding, or tenderness Rectal: No prostatic nodules felt, no  masses, pasty stool, brown in color.  Definitely not melenic, no maroon or burgundy tingeing.  Stool was Hemoccult positive at time of admission and was not sent for repeat guaiac testing. Msk:   Symmetrical without gross deformities. Pulses:  Normal radial pulse is noted. Extremities:   Without clubbing, cyanosis, or edema. Neurologic:  Alert and coherent;  grossly normal neurologically. Skin:  Intact without significant lesions or  rashes. Psych:   Alert and cooperative. Normal mood and affect.  Intake/Output from previous day: No intake/output data recorded. Intake/Output this shift: No intake/output data recorded.  Lab Results: Recent Labs    12/11/20 0252 12/11/20 1005  WBC 8.3  --   HGB 15.0 14.0  HCT 42.8 41.1  PLT 222  --    BMET Recent Labs    12/11/20 0252  NA 139  K 4.3  CL 104  CO2 23  GLUCOSE 145*  BUN 33*  CREATININE 1.83*  CALCIUM 9.2   LFT Recent Labs    12/11/20 0252  PROT 6.9  ALBUMIN 3.3*  AST 35  ALT 37  ALKPHOS 34*  BILITOT 1.4*   PT/INR Recent Labs    12/11/20 0252  LABPROT 19.3*  INR 1.7*    Studies/Results: No results found.  Impression: 1.  Recent nausea, vomiting, and diarrhea, abrupt onset, now resolved.  Although no one else at home is sick, this sounds like some form of viral gastroenteritis. 2.  Reported history of dark stools, not corroborated by current rectal exam 3.  Heme positive stool in ER, with normal hemoglobin 4.  Chronic anticoagulation 5.  Previous history of Barrett's esophagus and status post right hemicolectomy for advanced colonic adenoma, most recent exams (6 years ago) without significant abnormalities  Plan: 1.  Observation.  It does not appear this patient is having clinically significant GI bleeding 2.  Okay to restart anticoagulation from my standpoint.  Discussed with Dr. Marthenia Rolling. 3.  I will allow a regular diet tonight, but keep the patient n.p.o. after midnight to keep the option open to do endoscopic evaluation tomorrow if there is a significant drop in hemoglobin 4.  In the meantime, agree with PPI therapy for now.   LOS: 0 days   [place]  12/11/2020, 10:16 AM   Pager [phone] If no answer or after 5 PM call [phone]

## 2020-12-11 NOTE — ED Notes (Signed)
Patient is resting comfortably. Currently denies pain and nausea.

## 2020-12-11 NOTE — ED Provider Notes (Signed)
[facility] DEPT Provider Note   CSN: [id number] Arrival date & time: 12/11/20  0214     History Chief Complaint  Patient presents with  . GI Bleeding  . Abdominal Pain    Montavis Schubring is a 75 y.o. male.  Patient is a 75 year old male with past medical history of coronary artery disease, prior CVA, paroxysmal A. fib on Xarelto, Barrett's esophagus.  Patient presents today for evaluation of dark stools.  This has been worsening over the past several days.  He describes a tarry stool that is difficult to clean when he wipes.  He denies significant abdominal pain.  He denies any fevers or chills.  He does report some vomiting that is nonbloody.  He does report feeling weak and lightheaded on occasion.  The history is provided by the patient.       Past Medical History:  Diagnosis Date  . Abdominal pain   . Atrial fibrillation ([place])   . Barrett's esophagus   . CAD (coronary artery disease)   . Cancer [facility])    prostate  . Chronic kidney disease   . Colon polyp   . CVA (cerebral vascular accident) ([place]) 12/2014  . Diabetes mellitus    w/neuropathy  . Gum disease   . Hiatal hernia   . Hypertension   . Kidney stone    hx of  . Numbness    hands, feet  . OSA on CPAP   . Paresthesia of bilateral legs   . Pleural effusion   . Prostate cancer [facility]) 2007   radiation tx [place]  . PTSD (post-traumatic stress disorder)   . Stroke ([place])   . Vertigo     Patient Active Problem List   Diagnosis Date Noted  . Obstructive sleep apnea 06/20/2017  . Hyperlipidemia 04/17/2016  . Cerebrovascular accident (CVA) due to embolism of left middle cerebral artery ([place]) 04/17/2016  . 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 On amiodarone therapy 03/10/2015  . Stroke ([place]) 12/31/2014  . Dysarthria 12/30/2014  . Chronic systolic heart failure ([place]) 12/28/2014  . Coronary artery disease involving coronary bypass graft of native heart  with angina pectoris ([place]) 02/02/2014  . Paroxysmal atrial fibrillation ([place]) 02/02/2014  . Essential hypertension 02/02/2014  . Chronic anticoagulation 02/02/2014    Past Surgical History:  Procedure Laterality Date  . CATARACT EXTRACTION  2006 and 2007   bilateral  . COLON SURGERY  05/2011   hemicolectomy  . COLONOSCOPY    . CORONARY ARTERY BYPASS GRAFT  2004   x3       Family History  Problem Relation Age of Onset  . Angina Mother   . Diabetes Father   . Coronary artery disease Father   . Stroke Father     Social History   Tobacco Use  . Smoking status: Former Smoker    Years: 26.00    Types: E-cigarettes  . Smokeless tobacco: Never Used  Vaping Use  . Vaping Use: Never used  Substance Use Topics  . Alcohol use: Yes    Alcohol/week: 3.0 standard drinks    Types: 1 Glasses of wine, 1 Cans of beer, 1 Shots of liquor per week    Comment: social  . Drug use: Yes    Types: Marijuana    Comment: frequently--THC    Home Medications Prior to Admission medications   Medication Sig Start Date End Date Taking? Authorizing Provider  acetaminophen (TYLENOL) 325 MG tablet Take 650 mg  by mouth every 6 (six) hours as needed.    [provider]  amiodarone (PACERONE) 200 MG tablet Take 0.5 tablets (100 mg total) by mouth daily. 10/26/20   Belva Crome, MD  buPROPion (WELLBUTRIN XL) 150 MG 24 hr tablet Take 150 mg by mouth daily. 10/29/14   [provider]  calcium carbonate (TUMS - DOSED IN MG ELEMENTAL CALCIUM) 500 MG chewable tablet Chew 1 tablet by mouth daily.    [provider]  Continuous Blood Gluc Receiver ([place]) [place] by Does not apply route.    [provider]  Continuous Blood Gluc Sensor (DEXCOM G6 SENSOR) MISC by Does not apply route.    [provider]  Continuous Blood Gluc Transmit (DEXCOM G6 TRANSMITTER) MISC See admin instructions. 10/29/20   [provider]  dapagliflozin propanediol  (FARXIGA) 10 MG TABS tablet Take by mouth daily.    [provider]  escitalopram (LEXAPRO) 20 MG tablet Take 20 mg by mouth daily.    [provider]  esomeprazole (NEXIUM) 40 MG capsule Take 40 mg by mouth daily at 12 noon.    [provider]  Ferrous Sulfate (IRON PO) Take 1 tablet by mouth daily.    [provider]  levocetirizine (XYZAL) 5 MG tablet Take 5 mg by mouth daily. 11/12/18   [provider]  levothyroxine (SYNTHROID) 25 MCG tablet Take 25 mcg by mouth daily. 11/01/20   [provider]  metoprolol succinate (TOPROL-XL) 50 MG 24 hr tablet Take 1 tablet (50 mg total) by mouth daily. Take with or immediately following a meal. 11/30/20   Belva Crome, MD  Omega-3-acid Ethyl Esters (LOVAZA PO) Take 1 tablet by mouth 2 (two) times daily.    [provider]  polyethylene glycol (MIRALAX / GLYCOLAX) 17 g packet Take 17 g by mouth as needed.    [provider]  rosuvastatin (CRESTOR) 10 MG tablet Take 10 mg by mouth at bedtime.    [provider]  tamsulosin (FLOMAX) 0.4 MG CAPS capsule Take 0.4 mg by mouth daily.    [provider]  TOVIAZ 8 MG TB24 tablet Take 8 mg by mouth daily. 04/27/16   [provider]  TRESIBA FLEXTOUCH 200 UNIT/ML SOPN Inject 80 Units into the skin daily. 11/11/15   [provider]  TRULICITY 6.81 EX/5.1ZG SOPN Inject 0.75 mg into the skin once a week. 10/19/20   [provider]  XARELTO 20 MG TABS tablet TAKE 1 TABLET DAILY 08/29/20   Belva Crome, MD    Allergies    Aspirin, Byetta 10 mcg pen [exenatide], Invokana [canagliflozin], Levemir [insulin detemir], Nsaids, and Short ragweed pollen ext  Review of Systems   Review of Systems  All other systems reviewed and are negative.   Physical Exam Updated Vital Signs BP 124/74 (BP Location: Right Arm)   Pulse 70   Temp 98.3 F (36.8 C) (Oral)   Resp 17   Ht 5\' 8"  (1.727 m)   Wt 90.7 kg   SpO2  95%   BMI 30.41 kg/m   Physical Exam Vitals and nursing note reviewed.  Constitutional:      General: He is not in acute distress.    Appearance: He is well-developed and well-nourished. He is not diaphoretic.  HENT:     Head: Normocephalic and atraumatic.     Mouth/Throat:     Mouth: Oropharynx is clear and moist.  Cardiovascular:     Rate and  Rhythm: Normal rate and regular rhythm.     Heart sounds: No murmur heard. No friction rub.  Pulmonary:     Effort: Pulmonary effort is normal. No respiratory distress.     Breath sounds: Normal breath sounds. No wheezing or rales.  Abdominal:     General: Bowel sounds are normal. There is no distension.     Palpations: Abdomen is soft.     Tenderness: There is no abdominal tenderness.  Genitourinary:    Rectum: Guaiac result positive. No mass or tenderness.     Comments: Several hemorrhoids are noted.  Stool is greenish-black in color. Musculoskeletal:        General: No edema. Normal range of motion.     Cervical back: Normal range of motion and neck supple.  Skin:    General: Skin is warm and dry.  Neurological:     Mental Status: He is alert and oriented to person, place, and time.     Coordination: Coordination normal.     ED Results / Procedures / Treatments   Labs (all labs ordered are listed, but only abnormal results are displayed) Labs Reviewed  COMPREHENSIVE METABOLIC PANEL  CBC  PROTIME-INR  POC OCCULT BLOOD, ED  TYPE AND SCREEN    EKG None  Radiology No results found.  Procedures Procedures   Medications Ordered in ED Medications - No data to display  ED Course  I have reviewed the triage vital signs and the nursing notes.  Pertinent labs & imaging results that were available during my care of the patient were reviewed by me and considered in my medical decision making (see chart for details).    MDM Rules/Calculators/A&P  Patient is a 75 year old male presenting with complaints of dark stools.   This was also associated with some vomiting.  He reports feeling weak and somewhat dizzy.  Patient's rectal examination reveals greenish-black stool that is heme positive.  As patient is taking Xarelto, I feel as though admission for observation is indicated.  I have spoken with Dr. Alcario Drought who agrees to admit.  Final Clinical Impression(s) / ED Diagnoses Final diagnoses:  None    Rx / DC Orders ED Discharge Orders    None       Veryl Speak, MD 12/11/20 [phone]

## 2020-12-11 NOTE — Progress Notes (Signed)
Brief note: -Patient was admitted earlier today. -As per HPI done by Dr. Jennette Kettle: "HPI: Walter [name] is a 75 y.o. 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  Pt presents to the ED with several day h/o black tarry stool.  Also has NBNB emesis.  Symptoms are constant, mild.  Nothing makes symptoms better or worse.  No abd pain, no fever, chills.   ED Course: Hemoccult is grossly positive on card.  HGB 15 though"  -Patient was seen by GI provider, Dr. Cristina Gong.  Due to [place] has instructed that Xarelto should be restarted.  GI team is directing patient's care.

## 2020-12-11 NOTE — ED Notes (Signed)
RN attempt to call report.  Inpatient RN unavailable at this time (no answer when transferred).

## 2020-12-11 NOTE — H&P (Signed)
History and Physical    Walter [name] JOI:[id number] DOB: 1946/05/14 DOA: 12/11/2020  PCP: Walter Orn, MD  Patient coming from: Home  I have personally briefly reviewed patient's old medical records in [place]  Chief Complaint: Melena  HPI: Walter [name] is a 75 y.o. 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  Pt presents to the ED with several day h/o black tarry stool.  Also has NBNB emesis.  Symptoms are constant, mild.  Nothing makes symptoms better or worse.  No abd pain, no fever, chills.   ED Course: Hemoccult is grossly positive on card.  HGB 15 though.   Review of Systems: As per HPI, otherwise all review of systems negative.  Past Medical History:  Diagnosis Date  . Abdominal pain   . Atrial fibrillation ([place])   . Barrett's esophagus   . CAD (coronary artery disease)   . Cancer [facility])    prostate  . Chronic kidney disease   . Colon polyp   . CVA (cerebral vascular accident) ([place]) 12/2014  . Diabetes mellitus    w/neuropathy  . Gum disease   . Hiatal hernia   . Hypertension   . Kidney stone    hx of  . Numbness    hands, feet  . OSA on CPAP   . Paresthesia of bilateral legs   . Pleural effusion   . Prostate cancer [facility]) 2007   radiation tx [place]  . PTSD (post-traumatic stress disorder)   . Stroke ([place])   . Vertigo     Past Surgical History:  Procedure Laterality Date  . CATARACT EXTRACTION  2006 and 2007   bilateral  . COLON SURGERY  05/2011   hemicolectomy  . COLONOSCOPY    . CORONARY ARTERY BYPASS GRAFT  2004   x3     reports that he has quit smoking. His smoking use included e-cigarettes. He quit after 26.00 years of use. He has never used smokeless tobacco. He reports  current alcohol use of about 3.0 standard drinks of alcohol per week. He reports current drug use. Drug: Marijuana.  Allergies  Allergen Reactions  . Aspirin Other (See Comments)  . Byetta 10 Mcg Pen [Exenatide] Nausea Only  . Invokana [Canagliflozin] Other (See Comments)    Orthostatic hypotension  . Levemir [Insulin Detemir] Other (See Comments)    Burning at injection site  . Nsaids   . Short Ragweed Pollen Ext Cough    Family History  Problem Relation Age of Onset  . Angina Mother   . Diabetes Father   . Coronary artery disease Father   . Stroke Father      Prior to Admission medications   Medication Sig Start Date End Date Taking? Authorizing Provider  acetaminophen (TYLENOL) 325 MG tablet Take 650 mg by mouth every 6 (six) hours as needed.    [provider]  amiodarone (PACERONE) 200 MG tablet Take 0.5 tablets (100 mg total) by mouth daily. 10/26/20   Belva Crome, MD  buPROPion (WELLBUTRIN XL) 150 MG 24 hr tablet Take 150 mg by mouth daily. 10/29/14   [provider]  calcium carbonate (TUMS - DOSED IN MG ELEMENTAL CALCIUM) 500 MG chewable tablet Chew 1 tablet by mouth daily.  [provider]  Continuous Blood Gluc Receiver ([place]) [place] by Does not apply route.    [provider]  Continuous Blood Gluc Sensor (DEXCOM G6 SENSOR) MISC by Does not apply route.    [provider]  Continuous Blood Gluc Transmit (DEXCOM G6 TRANSMITTER) MISC See admin instructions. 10/29/20   [provider]  dapagliflozin propanediol (FARXIGA) 10 MG TABS tablet Take by mouth daily.    [provider]  escitalopram (LEXAPRO) 20 MG tablet Take 20 mg by mouth daily.    [provider]  esomeprazole (NEXIUM) 40 MG capsule Take 40 mg by mouth daily at 12 noon.    [provider]  Ferrous Sulfate (IRON PO) Take 1 tablet by mouth daily.    [provider]  levocetirizine (XYZAL) 5 MG tablet Take 5  mg by mouth daily. 11/12/18   [provider]  levothyroxine (SYNTHROID) 25 MCG tablet Take 25 mcg by mouth daily. 11/01/20   [provider]  metoprolol succinate (TOPROL-XL) 50 MG 24 hr tablet Take 1 tablet (50 mg total) by mouth daily. Take with or immediately following a meal. 11/30/20   Belva Crome, MD  Omega-3-acid Ethyl Esters (LOVAZA PO) Take 1 tablet by mouth 2 (two) times daily.    [provider]  polyethylene glycol (MIRALAX / GLYCOLAX) 17 g packet Take 17 g by mouth as needed.    [provider]  rosuvastatin (CRESTOR) 10 MG tablet Take 10 mg by mouth at bedtime.    [provider]  tamsulosin (FLOMAX) 0.4 MG CAPS capsule Take 0.4 mg by mouth daily.    [provider]  TOVIAZ 8 MG TB24 tablet Take 8 mg by mouth daily. 04/27/16   [provider]  TRESIBA FLEXTOUCH 200 UNIT/ML SOPN Inject 80 Units into the skin daily. 11/11/15   [provider]  TRULICITY 2.97 LG/9.2JJ SOPN Inject 0.75 mg into the skin once a week. 10/19/20   [provider]  XARELTO 20 MG TABS tablet TAKE 1 TABLET DAILY 08/29/20   Belva Crome, MD    Physical Exam: Vitals:   12/11/20 0224 12/11/20 0300 12/11/20 0330 12/11/20 0400  BP: 124/74 121/70 113/67 108/71  Pulse: 70 64 (!) 105 93  Resp: 17 15 16    Temp: 98.3 F (36.8 C)     TempSrc: Oral     SpO2: 95% 95% 90% 97%  Weight: 90.7 kg     Height: 5\' 8"  (1.727 m)       Constitutional: NAD, calm, comfortable Eyes: PERRL, lids and conjunctivae normal ENMT: Mucous membranes are moist. Posterior pharynx clear of any exudate or lesions.Normal dentition.  Neck: normal, supple, no masses, no thyromegaly Respiratory: clear to auscultation bilaterally, no wheezing, no crackles. Normal respiratory effort. No accessory muscle use.  Cardiovascular: Regular rate and rhythm, no murmurs / rubs / gallops. No extremity edema. 2+ pedal pulses. No carotid bruits.  Abdomen: no tenderness, no  masses palpated. No hepatosplenomegaly. Bowel sounds positive.  Musculoskeletal: no clubbing / cyanosis. No joint deformity upper and lower extremities. Good ROM, no contractures. Normal muscle tone.  Skin: no rashes, lesions, ulcers. No induration Neurologic: CN 2-12 grossly intact. Sensation intact, DTR normal. Strength 5/5 in all 4.  Psychiatric: Normal judgment and insight. Alert and oriented x 3. Normal mood.    Labs on Admission: I have personally reviewed following labs and imaging studies  CBC: Recent Labs  Lab 12/11/20 0252  WBC 8.3  HGB 15.0  HCT 42.8  MCV 90.3  PLT 735   Basic Metabolic Panel: Recent Labs  Lab 12/11/20 0252  NA 139  K 4.3  CL 104  CO2 23  GLUCOSE 145*  BUN 33*  CREATININE 1.83*  CALCIUM 9.2   GFR: Estimated Creatinine Clearance: 38.1 mL/min (A) (by C-G formula based on SCr of 1.83 mg/dL (H)). Liver Function Tests: Recent Labs  Lab 12/11/20 0252  AST 35  ALT 37  ALKPHOS 34*  BILITOT 1.4*  PROT 6.9  ALBUMIN 3.3*   No results for input(s): LIPASE, AMYLASE in the last 168 hours. No results for input(s): AMMONIA in the last 168 hours. Coagulation Profile: Recent Labs  Lab 12/11/20 0252  INR 1.7*   Cardiac Enzymes: No results for input(s): CKTOTAL, CKMB, CKMBINDEX, TROPONINI in the last 168 hours. BNP (last 3 results) No results for input(s): PROBNP in the last 8760 hours. HbA1C: No results for input(s): HGBA1C in the last 72 hours. CBG: Recent Labs  Lab 12/11/20 0436  GLUCAP 138*   Lipid Profile: No results for input(s): CHOL, HDL, LDLCALC, TRIG, CHOLHDL, LDLDIRECT in the last 72 hours. Thyroid Function Tests: No results for input(s): TSH, T4TOTAL, FREET4, T3FREE, THYROIDAB in the last 72 hours. Anemia Panel: No results for input(s): VITAMINB12, FOLATE, FERRITIN, TIBC, IRON, RETICCTPCT in the last 72 hours. Urine analysis:    Component Value Date/Time   COLORURINE YELLOW 12/30/2014 2125   APPEARANCEUR CLEAR 12/30/2014  2125   LABSPEC 1.015 12/30/2014 2125   PHURINE 5.0 12/30/2014 2125   GLUCOSEU NEGATIVE 12/30/2014 2125   HGBUR NEGATIVE 12/30/2014 2125   BILIRUBINUR NEGATIVE 12/30/2014 2125   KETONESUR NEGATIVE 12/30/2014 2125   PROTEINUR NEGATIVE 12/30/2014 2125   UROBILINOGEN 0.2 12/30/2014 2125   NITRITE NEGATIVE 12/30/2014 2125   LEUKOCYTESUR NEGATIVE 12/30/2014 2125    Radiological Exams on Admission: No results found.  EKG: Independently reviewed.  Assessment/Plan Principal Problem:   Melena Active Problems:   Paroxysmal atrial fibrillation (HCC)   Essential hypertension   Chronic anticoagulation   Chronic systolic heart failure (HCC)   Stroke (HCC)   Type 2 diabetes mellitus with circulatory disorder ([place])    1. Melena - 1. Grossly positive hemoccult on card, however HGB 15. 2. Holding Xarelto for the moment 3. Clear liquid diet 4. IVF: LR at 125 5. Repeat H/H at 10am 6. Message sent to Dr. Cristina Gong for consult 2. PAF 1. Holding xarelto 2. If H/H stable, may want to consider bridging therapy with heparin gtt?  Concern is that this patient rapidly had embolic stroke the last time his anticoagulation was held.  Will defer this judgement call to day team and GI though. 3. Cont BB 4. Cont amiodarone 3. HTN - 1. Cont BB 2. Hold flomax 4. CAD - 1. Cont statin 5. DM2 - 1. Half home Tresiba (so 40u QHS) 2. Mod scale SSI Q4H 3. Hold other hypoglycemics  DVT prophylaxis: SCDs Code Status: Full Family Communication: Wife at bedside Disposition Plan: Home after GIB worked up [employer] called: Message sent to Dr. Cristina Gong Admission status: Place in [place], [place] Hospitalists  How to contact the [facility] Attending or Consulting provider [place] or covering provider during after hours [place], for this patient?  1. Check the care team in [facility] and look for a) attending/consulting [facility] provider listed and b) the [facility] team listed 2. Log into [email or web address]  Amion  Physician Scheduling and messaging for groups and whole hospitals  On call and physician scheduling software for group practices, residents, hospitalists and other medical providers for call, clinic, rotation and shift schedules. [employer] is a hospital-wide system for scheduling doctors and paging doctors on call. EasyPlot is for scientific plotting and data analysis.  [email or web address]  and use [place]'s universal password to access. If you do not have the password, please contact the hospital operator.  3. Locate the [facility] provider you are looking for under [facility] and page to a number that you can be directly reached. 4. If you still have difficulty reaching the provider, please page the [facility] (Director on Call) for the Hospitalists listed on amion for assistance.  12/11/2020, 4:58 AM

## 2020-12-11 NOTE — ED Notes (Addendum)
Pt had BP of 79/39. Recheck was 96/63. Verbal order per Dr. Stark Jock to bolus 400 ml to support pt BP. Pt was alert & oriented and indicated he felt fine. No AMS or signs of distress.

## 2020-12-11 NOTE — ED Triage Notes (Addendum)
Patient presents with possible GI bleed. Patient states Friday he began having vomiting and diarrhea. Patient states his stool is "black and tarry." Patient states the tarry stools started before the symptoms on Friday. Patient states weakness and dizziness. Patient with hx of hemicolectomy.

## 2020-12-12 DIAGNOSIS — K921 Melena: Secondary | ICD-10-CM | POA: Diagnosis not present

## 2020-12-12 LAB — COMPREHENSIVE METABOLIC PANEL
ALT: 36 U/L (ref 0–44)
AST: 34 U/L (ref 15–41)
Albumin: 2.8 g/dL — ABNORMAL LOW (ref 3.5–5.0)
Alkaline Phosphatase: 31 U/L — ABNORMAL LOW (ref 38–126)
Anion gap: 9 (ref 5–15)
BUN: 21 mg/dL (ref 8–23)
CO2: 24 mmol/L (ref 22–32)
Calcium: 9 mg/dL (ref 8.9–10.3)
Chloride: 108 mmol/L (ref 98–111)
Creatinine, Ser: 1.54 mg/dL — ABNORMAL HIGH (ref 0.61–1.24)
GFR, Estimated: 47 mL/min — ABNORMAL LOW (ref >60)
Glucose, Bld: 77 mg/dL (ref 70–99)
Potassium: 4.2 mmol/L (ref 3.5–5.1)
Sodium: 141 mmol/L (ref 135–145)
Total Bilirubin: 1.2 mg/dL (ref 0.3–1.2)
Total Protein: 5.8 g/dL — ABNORMAL LOW (ref 6.5–8.1)

## 2020-12-12 LAB — CBC
HCT: 38.9 % — ABNORMAL LOW (ref 39.0–52.0)
Hemoglobin: 13.2 g/dL (ref 13.0–17.0)
MCH: 31.3 pg (ref 26.0–34.0)
MCHC: 33.9 g/dL (ref 30.0–36.0)
MCV: 92.2 fL (ref 80.0–100.0)
Platelets: 194 10*3/uL (ref 150–400)
RBC: 4.22 MIL/uL (ref 4.22–5.81)
RDW: 13.2 % (ref 11.5–15.5)
WBC: 6.2 10*3/uL (ref 4.0–10.5)
nRBC: 0 % (ref 0.0–0.2)

## 2020-12-12 LAB — GLUCOSE, CAPILLARY
Glucose-Capillary: 167 mg/dL — ABNORMAL HIGH (ref 70–99)
Glucose-Capillary: 59 mg/dL — ABNORMAL LOW (ref 70–99)
Glucose-Capillary: 103 mg/dL — ABNORMAL HIGH (ref 70–99)
Glucose-Capillary: 134 mg/dL — ABNORMAL HIGH (ref 70–99)

## 2020-12-12 LAB — BILIRUBIN, DIRECT: Bilirubin, Direct: 0.2 mg/dL (ref 0.0–0.2)

## 2020-12-12 MED ORDER — DEXTROSE IN LACTATED RINGERS 5 % IV SOLN: INTRAVENOUS | Status: DC

## 2020-12-12 MED ORDER — DEXTROSE 50 % IV SOLN
INTRAVENOUS | Status: AC
  Administered 2020-12-12: 50 mL
  Filled 2020-12-12: qty 50

## 2020-12-12 NOTE — Progress Notes (Addendum)
Patient indicates he had a small, normal-appearing bowel movement yesterday afternoon.  No further nausea or vomiting.  Hemoglobin this morning is slightly lower at 13.2, although this level is actually similar to where he was 2 years ago, so I think his admission hemoglobin was falsely elevated due to dehydration.  For comparison, his hemoglobin last July, in the office of his primary physician, was 12.5.  Moreover, despite being back on anticoagulation, the patient's BUN remains in the normal range at 21, actually lower than yesterday in association with a correlative improvement in his creatinine.  Impression:   1.  Transient dark stools with heme positivity on admission, probably had some self-limited low-grade GI bleed which has subsequently resolved.  No evidence of active GI bleeding at this time.  2.  Nausea and vomiting of unclear cause, resolved.  Plan: I will resume the patient's diet.  NOTE: I spoke with this patient on the phone and reviewed his record to obtain the above history, but he was discharged from the hospital before I got by to see him.  Cleotis Nipper, M.D. Pager [phone] If no answer or after 5 PM call [phone]

## 2020-12-12 NOTE — Discharge Summary (Signed)
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 Patient presented to the ED on 12/11/2020 with several history of intermittent black tarry stool, nausea, vomiting and diarrhea.   In the ED, Hemoccult grossly positive.  Labs with hemoglobin of 15.   Kept in observation overnight.  GI consult appreciated.  Subjective:  Seen and examined this morning.  Pleasant elderly Caucasian male.  Not in distress.  No bleeding since admission.  Hemoglobin remained stable Nausea, vomiting and diarrhea improved.  Tolerated diet this morning.  Wants to go home.  Hospital Course:  Melena  history of GERD, Barrett's esophagus -Presented with intermittent black tarry stool in the setting of long-term anticoagulation with Xarelto -Hemoccult test positive but hemoglobin stable -No bleeding episode in the hospital. -GI consultation obtained.  No recommendation of any intervention at this time. -Continue Xarelto at discharge. -Continue PPI  Viral gastroenteritis -Nausea, vomiting and diarrhea at home.  Given IV hydration. -Symptoms improved.  Okay to  discharge today.  A. fib on long-term anticoagulation with Xarelto -Continue metoprolol, amiodarone.  Continue Xarelto  DM2 Hypoglycemia -A1c 6.8 on 3/6 -Home meds include Tresiba 70 units at bedtime, Trulicity 8.24 mg weekly, Farxiga 10 mg daily.  Continue the same at home. -Blood sugar was low at 59 this morning because of n.p.o. status.  Sugar level stabilized.  Resume the same regimen at home post discharge.  Hypothyroidism -Continue Synthroid  Stable for discharge to home today. Wound care:    Discharge Exam:   Vitals:   12/11/20 1836 12/11/20 1928 12/12/20 0445 12/12/20 1003  BP: 128/67 133/75 124/71 (!) 179/159  Pulse: 61 (!) 59 (!) 57 (!) 56  Resp: 15 14 16    Temp: (!) 97.5 F (36.4 C) 98.1 F (36.7 C) 97.7 F (36.5 C) 97.6 F (36.4 C)  TempSrc: Oral Oral Oral Oral  SpO2: 99% 100% 99% 100%  Weight:      Height:        Body mass index is 30.41 kg/m.  General exam: Pleasant, elderly Caucasian male.  Not in distress Skin: No rashes, lesions or ulcers. HEENT: Atraumatic, normocephalic, no obvious bleeding Lungs: Clear to auscultation bilaterally CVS: Regular rate and rhythm, no murmur GI/Abd soft, nontender, nondistended, bowel sound present CNS: Alert, awake, oriented x3 Psychiatry: Mood appropriate Extremities: No pedal edema, no calf tenderness  Follow ups:   Discharge Instructions    Increase activity slowly   Complete by: As directed       Follow-up Information    Lavone Orn, MD Follow up.   Specialty: Internal Medicine Contact information: [address] [place] [address] [phone]        Tamala Julian,  Lynnell Dike, MD .   Specialty: Cardiology Contact information: [address] [place] [address] [phone]               Recommendations for Outpatient Follow-Up:   1. Follow-up with PCP as an outpatient 2. Follow-up with GI as an outpatient  Discharge Instructions:  Follow with  Primary MD Lavone Orn, MD in 7 days   Get CBC/BMP checked in next visit within 1 week by PCP or SNF MD ( we routinely change or add medications that can affect your baseline labs and fluid status, therefore we recommend that you get the mentioned basic workup next visit with your PCP, your PCP may decide not to get them or add new tests based on their clinical decision)  On your next visit with your PCP, please Get Medicines reviewed and adjusted.  Please request your PCP  to go over all Hospital Tests and Procedure/Radiological results at the follow up, please get all Hospital records sent to your Prim MD by signing hospital release before you go home.  Activity: As tolerated with Full fall precautions use walker/cane & assistance as needed  For Heart failure patients - Check your Weight same time everyday, if you gain over 2 pounds, or you develop in leg swelling, experience more shortness of breath or chest pain, call your Primary MD immediately. Follow Cardiac Low Salt Diet and 1.5 lit/day fluid restriction.  If you have smoked or chewed Tobacco in the last 2 yrs please stop smoking, stop any regular Alcohol  and or any Recreational drug use.  If you experience worsening of your admission symptoms, develop shortness of breath, life threatening emergency, suicidal or homicidal thoughts you must seek medical attention immediately by calling 911 or calling your MD immediately  if symptoms less severe.  You Must read complete instructions/literature along with all the possible adverse reactions/side effects for all the Medicines you take and that have been prescribed to you. Take any new Medicines after you have completely understood and accpet all the possible adverse reactions/side effects.   Do not drive, operate heavy machinery, perform activities at heights, swimming or participation in water activities or provide baby sitting services if your were admitted for syncope or siezures until you  have seen by Primary MD or a Neurologist and advised to do so again.  Do not drive when taking Pain medications.  Do not take more than prescribed Pain, Sleep and Anxiety Medications  Wear Seat belts while driving.   Please note You were cared for by a hospitalist during your hospital stay. If you have any questions about your discharge medications or the care you received while you were in the hospital after you are discharged, you can call the unit and asked to speak with the hospitalist on call if the hospitalist that took care of you is not available. Once you are discharged, your primary care physician will handle any further medical issues. Please note that NO REFILLS for any discharge medications will be authorized once you are discharged, as it is imperative that you return to your primary care physician (or establish a relationship with a primary care physician if you do not have one) for your aftercare needs so that they can reassess your need for medications and monitor your lab values.    Allergies as of 12/12/2020      Reactions   Aspirin Other (See Comments)   Byetta 10 Mcg Pen [exenatide] Nausea Only   Invokana [canagliflozin]  Other (See Comments)   Orthostatic hypotension   Levemir [insulin Detemir] Other (See Comments)   Burning at injection site   Nsaids    Short Ragweed Pollen Ext Cough      Medication List    TAKE these medications   acetaminophen 325 MG tablet Commonly known as: TYLENOL Take 650 mg by mouth every 6 (six) hours as needed for mild pain.   amiodarone 200 MG tablet Commonly known as: PACERONE Take 0.5 tablets (100 mg total) by mouth daily. What changed: how much to take   buPROPion 150 MG 24 hr tablet Commonly known as: WELLBUTRIN XL Take 150 mg by mouth daily.   calcium carbonate 500 MG chewable tablet Commonly known as: TUMS - dosed in mg elemental calcium Chew 1 tablet by mouth daily as needed for indigestion or heartburn.   dapagliflozin  propanediol 10 MG Tabs tablet Commonly known as: FARXIGA Take 10 mg by mouth daily.   Dexcom G6 Receiver Devi by Does not apply route.   Dexcom G6 Sensor Misc by Does not apply route.   Dexcom G6 Transmitter Misc See admin instructions.   escitalopram 20 MG tablet Commonly known as: LEXAPRO Take 20 mg by mouth daily.   esomeprazole 40 MG capsule Commonly known as: NEXIUM Take 40 mg by mouth daily as needed.   IRON PO Take 1 tablet by mouth daily.   levocetirizine 5 MG tablet Commonly known as: XYZAL Take 5 mg by mouth daily.   levothyroxine 25 MCG tablet Commonly known as: SYNTHROID Take 25 mcg by mouth daily.   LOVAZA PO Take 1 tablet by mouth 2 (two) times daily.   metoprolol succinate 50 MG 24 hr tablet Commonly known as: TOPROL-XL Take 1 tablet (50 mg total) by mouth daily. Take with or immediately following a meal.   polyethylene glycol 17 g packet Commonly known as: MIRALAX / GLYCOLAX Take 17 g by mouth daily as needed for mild constipation.   rosuvastatin 10 MG tablet Commonly known as: CRESTOR Take 10 mg by mouth at bedtime.   tamsulosin 0.4 MG Caps capsule Commonly known as: FLOMAX Take 0.8 mg by mouth daily.   Toviaz 8 MG Tb24 tablet Generic drug: fesoterodine Take 8 mg by mouth at bedtime.   Tyler Aas FlexTouch 200 UNIT/ML FlexTouch Pen Generic drug: insulin degludec Inject 70 Units into the skin at bedtime.   Trulicity 1.61 WR/6.0AV Sopn Generic drug: Dulaglutide Inject 0.75 mg into the skin every Tuesday.   Xarelto 20 MG Tabs tablet Generic drug: rivaroxaban TAKE 1 TABLET DAILY What changed: how much to take       Time coordinating discharge: 35 minutes  The results of significant diagnostics from this hospitalization (including imaging, microbiology, ancillary and laboratory) are listed below for reference.    Procedures and Diagnostic Studies:   No results found.   Labs:   Basic Metabolic Panel: Recent Labs  Lab  12/11/20 0252 12/12/20 0423  NA 139 141  K 4.3 4.2  CL 104 108  CO2 23 24  GLUCOSE 145* 77  BUN 33* 21  CREATININE 1.83* 1.54*  CALCIUM 9.2 9.0   GFR Estimated Creatinine Clearance: 45.3 mL/min (A) (by C-G formula based on SCr of 1.54 mg/dL (H)). Liver Function Tests: Recent Labs  Lab 12/11/20 0252 12/12/20 0423  AST 35 34  ALT 37 36  ALKPHOS 34* 31*  BILITOT 1.4* 1.2  PROT 6.9 5.8*  ALBUMIN 3.3* 2.8*   No results for input(s): LIPASE, AMYLASE in the last  168 hours. No results for input(s): AMMONIA in the last 168 hours. Coagulation profile Recent Labs  Lab 12/11/20 0252  INR 1.7*    CBC: Recent Labs  Lab 12/11/20 0252 12/11/20 1005 12/12/20 0423  WBC 8.3  --  6.2  HGB 15.0 14.0 13.2  HCT 42.8 41.1 38.9*  MCV 90.3  --  92.2  PLT 222  --  194   Cardiac Enzymes: No results for input(s): CKTOTAL, CKMB, CKMBINDEX, TROPONINI in the last 168 hours. BNP: Invalid input(s): POCBNP CBG: Recent Labs  Lab 12/11/20 1635 12/12/20 0455 12/12/20 0517 12/12/20 0759 12/12/20 1130  GLUCAP 97 59* 167* 103* 134*   D-Dimer No results for input(s): DDIMER in the last 72 hours. Hgb A1c Recent Labs    12/11/20 0252  HGBA1C 6.8*   Lipid Profile No results for input(s): CHOL, HDL, LDLCALC, TRIG, CHOLHDL, LDLDIRECT in the last 72 hours. Thyroid function studies No results for input(s): TSH, T4TOTAL, T3FREE, THYROIDAB in the last 72 hours.  Invalid input(s): FREET3 Anemia work up No results for input(s): VITAMINB12, FOLATE, FERRITIN, TIBC, IRON, RETICCTPCT in the last 72 hours. Microbiology Recent Results (from the past 240 hour(s))  SARS CORONAVIRUS 2 (TAT 6-24 HRS) Nasopharyngeal Nasopharyngeal Swab     Status: None   Collection Time: 12/11/20  5:20 AM   Specimen: Nasopharyngeal Swab  Result Value Ref Range Status   SARS Coronavirus 2 NEGATIVE NEGATIVE Final    Comment: (NOTE) SARS-CoV-2 target nucleic acids are NOT DETECTED.  The SARS-CoV-2 RNA is  generally detectable in upper and lower respiratory specimens during the acute phase of infection. Negative results do not preclude SARS-CoV-2 infection, do not rule out co-infections with other pathogens, and should not be used as the sole basis for treatment or other patient management decisions. Negative results must be combined with clinical observations, patient history, and epidemiological information. The expected result is Negative.  Fact Sheet for Patients: SugarRoll.be  Fact Sheet for Healthcare Providers: https://www.woods-mathews.com/  This test is not yet approved or cleared by the [place] FDA and  has been authorized for detection and/or diagnosis of SARS-CoV-2 by FDA under an Emergency Use Authorization (EUA). This EUA will remain  in effect (meaning this test can be used) for the duration of the COVID-19 declaration under Se ction 564(b)(1) of the Act, 21 U.S.C. section 360bbb-3(b)(1), unless the authorization is terminated or revoked sooner.  Performed at [facility], [place] [address] [place], [place] [address]      Signed: Terrilee Croak  [facility] 12/12/2020, 1:29 PM

## 2020-12-12 NOTE — Progress Notes (Signed)
Inpatient Diabetes Program Recommendations  AACE/ADA: New Consensus Statement on Inpatient Glycemic Control (2015)  Target Ranges:  Prepandial:   less than 140 mg/dL      Peak postprandial:   less than 180 mg/dL (1-2 hours)      Critically ill patients:  140 - 180 mg/dL   Lab Results  Component Value Date   GLUCAP 134 (H) 12/12/2020   HGBA1C 6.8 (H) 12/11/2020    Review of Glycemic Control Results for Walter [name], Walter "[name]" (MRN [id number]) as of 12/12/2020 11:33  Ref. Range 12/12/2020 04:55 12/12/2020 05:17 12/12/2020 07:59 12/12/2020 11:30  Glucose-Capillary Latest Ref Range: 70 - 99 mg/dL 59 (L) 167 (H) 103 (H) 134 (H)   Diabetes history: Type 2 DM Outpatient Diabetes medications: Farxiga 10 mg QD, Tresiba 70 units QHS, Trulicity 9.72 mg q/wk Current orders for Inpatient glycemic control: Novolog 0-15 units Q4H, Lantus 40 units QHS  Inpatient Diabetes Program Recommendations:     Noted hypoglycemia episode this AM. Consider reducing Lantus to 35 units QHS and changing correction to TID now that diet order placed.   Thanks, Bronson Curb, MSN, RNC-OB Diabetes Coordinator [phone] (8a-5p)

## 2020-12-13 ENCOUNTER — Ambulatory Visit: Payer: Medicare HMO

## 2020-12-15 ENCOUNTER — Ambulatory Visit: Payer: Medicare HMO

## 2020-12-18 ENCOUNTER — Emergency Department (HOSPITAL_BASED_OUTPATIENT_CLINIC_OR_DEPARTMENT_OTHER)
Admission: EM | Admit: 2020-12-18 | Discharge: 2020-12-18 | Disposition: A | Discharge status: Home / Self Care | Payer: Medicare HMO | Attending: Emergency Medicine | Admitting: Emergency Medicine

## 2020-12-18 ENCOUNTER — Other Ambulatory Visit: Payer: Self-pay

## 2020-12-18 ENCOUNTER — Emergency Department (HOSPITAL_BASED_OUTPATIENT_CLINIC_OR_DEPARTMENT_OTHER): Payer: Medicare HMO

## 2020-12-18 DIAGNOSIS — Z7901 Long term (current) use of anticoagulants: Secondary | ICD-10-CM | POA: Diagnosis not present

## 2020-12-18 DIAGNOSIS — I25118 Atherosclerotic heart disease of native coronary artery with other forms of angina pectoris: Secondary | ICD-10-CM | POA: Diagnosis not present

## 2020-12-18 DIAGNOSIS — R1084 Generalized abdominal pain: Secondary | ICD-10-CM | POA: Diagnosis not present

## 2020-12-18 DIAGNOSIS — E1122 Type 2 diabetes mellitus with diabetic chronic kidney disease: Secondary | ICD-10-CM | POA: Diagnosis not present

## 2020-12-18 DIAGNOSIS — Z951 Presence of aortocoronary bypass graft: Secondary | ICD-10-CM | POA: Diagnosis not present

## 2020-12-18 DIAGNOSIS — R5383 Other fatigue: Secondary | ICD-10-CM | POA: Insufficient documentation

## 2020-12-18 DIAGNOSIS — Z8546 Personal history of malignant neoplasm of prostate: Secondary | ICD-10-CM | POA: Diagnosis not present

## 2020-12-18 DIAGNOSIS — Z87891 Personal history of nicotine dependence: Secondary | ICD-10-CM | POA: Diagnosis not present

## 2020-12-18 DIAGNOSIS — I480 Paroxysmal atrial fibrillation: Secondary | ICD-10-CM | POA: Insufficient documentation

## 2020-12-18 DIAGNOSIS — Z79899 Other long term (current) drug therapy: Secondary | ICD-10-CM | POA: Diagnosis not present

## 2020-12-18 DIAGNOSIS — I130 Hypertensive heart and chronic kidney disease with heart failure and stage 1 through stage 4 chronic kidney disease, or unspecified chronic kidney disease: Secondary | ICD-10-CM | POA: Diagnosis not present

## 2020-12-18 DIAGNOSIS — N189 Chronic kidney disease, unspecified: Secondary | ICD-10-CM | POA: Diagnosis not present

## 2020-12-18 DIAGNOSIS — R110 Nausea: Secondary | ICD-10-CM | POA: Diagnosis not present

## 2020-12-18 DIAGNOSIS — Z7984 Long term (current) use of oral hypoglycemic drugs: Secondary | ICD-10-CM | POA: Diagnosis not present

## 2020-12-18 DIAGNOSIS — R42 Dizziness and giddiness: Secondary | ICD-10-CM | POA: Diagnosis not present

## 2020-12-18 DIAGNOSIS — I5022 Chronic systolic (congestive) heart failure: Secondary | ICD-10-CM | POA: Diagnosis not present

## 2020-12-18 DIAGNOSIS — K5900 Constipation, unspecified: Secondary | ICD-10-CM

## 2020-12-18 LAB — CBC WITH DIFFERENTIAL/PLATELET
Abs Immature Granulocytes: 0.06 10*3/uL (ref 0.00–0.07)
Basophils Absolute: 0 10*3/uL (ref 0.0–0.1)
Basophils Relative: 1 %
Eosinophils Absolute: 0.1 10*3/uL (ref 0.0–0.5)
Eosinophils Relative: 2 %
HCT: 42 % (ref 39.0–52.0)
Hemoglobin: 14.5 g/dL (ref 13.0–17.0)
Immature Granulocytes: 1 %
Lymphocytes Relative: 34 %
Lymphs Abs: 2.3 10*3/uL (ref 0.7–4.0)
MCH: 31.3 pg (ref 26.0–34.0)
MCHC: 34.5 g/dL (ref 30.0–36.0)
MCV: 90.7 fL (ref 80.0–100.0)
Monocytes Absolute: 0.7 10*3/uL (ref 0.1–1.0)
Monocytes Relative: 10 %
Neutro Abs: 3.6 10*3/uL (ref 1.7–7.7)
Neutrophils Relative %: 52 %
Platelets: 252 10*3/uL (ref 150–400)
RBC: 4.63 MIL/uL (ref 4.22–5.81)
RDW: 13.5 % (ref 11.5–15.5)
WBC: 6.8 10*3/uL (ref 4.0–10.5)
nRBC: 0 % (ref 0.0–0.2)

## 2020-12-18 LAB — COMPREHENSIVE METABOLIC PANEL
ALT: 29 U/L (ref 0–44)
AST: 28 U/L (ref 15–41)
Albumin: 3.7 g/dL (ref 3.5–5.0)
Alkaline Phosphatase: 31 U/L — ABNORMAL LOW (ref 38–126)
Anion gap: 6 (ref 5–15)
BUN: 19 mg/dL (ref 8–23)
CO2: 31 mmol/L (ref 22–32)
Calcium: 9.1 mg/dL (ref 8.9–10.3)
Chloride: 105 mmol/L (ref 98–111)
Creatinine, Ser: 1.87 mg/dL — ABNORMAL HIGH (ref 0.61–1.24)
GFR, Estimated: 37 mL/min — ABNORMAL LOW (ref >60)
Glucose, Bld: 73 mg/dL (ref 70–99)
Potassium: 4.5 mmol/L (ref 3.5–5.1)
Sodium: 142 mmol/L (ref 135–145)
Total Bilirubin: 0.7 mg/dL (ref 0.3–1.2)
Total Protein: 6.5 g/dL (ref 6.5–8.1)

## 2020-12-18 LAB — URINALYSIS, ROUTINE W REFLEX MICROSCOPIC
Bilirubin Urine: NEGATIVE
Glucose, UA: 500 mg/dL — AB
Hgb urine dipstick: NEGATIVE
Ketones, ur: NEGATIVE mg/dL
Leukocytes,Ua: NEGATIVE
Nitrite: NEGATIVE
Specific Gravity, Urine: 1.007 (ref 1.005–1.030)
pH: 7 (ref 5.0–8.0)

## 2020-12-18 LAB — LACTIC ACID, PLASMA: Lactic Acid, Venous: 0.7 mmol/L (ref 0.5–1.9)

## 2020-12-18 LAB — LIPASE, BLOOD: Lipase: 21 U/L (ref 11–51)

## 2020-12-18 MED ORDER — IOHEXOL 300 MG/ML  SOLN: 100.0000 mL | Freq: Once | INTRAMUSCULAR | Status: DC | PRN

## 2020-12-18 MED ORDER — FLEET ENEMA 7-19 GM/118ML RE ENEM
1.0000 | ENEMA | Freq: Once | RECTAL | Status: AC
  Administered 2020-12-18: 1 via RECTAL
  Filled 2020-12-18: qty 1

## 2020-12-18 MED ORDER — SODIUM CHLORIDE 0.9 % IV BOLUS
1000.0000 mL | Freq: Once | INTRAVENOUS | Status: AC
  Administered 2020-12-18: 1000 mL via INTRAVENOUS

## 2020-12-18 NOTE — ED Notes (Signed)
Checked w/pt - still on [facility]

## 2020-12-18 NOTE — ED Notes (Signed)
Pt back from CT

## 2020-12-18 NOTE — ED Notes (Signed)
Pt on [facility]

## 2020-12-18 NOTE — ED Notes (Signed)
Enema performed - pt has call bell and instructed to call  BS commode next to pt bed

## 2020-12-18 NOTE — ED Provider Notes (Signed)
[facility] EMERGENCY DEPT Provider Note   CSN: [id number] Arrival date & time: 12/18/20  1452     History Chief Complaint  Patient presents with  . Constipation    Walter [name] is a 75 y.o. male.  The history is provided by the patient and medical records. No language interpreter was used.  Constipation Severity:  Severe Time since last bowel movement:  5 days Timing:  Constant Progression:  Worsening Chronicity:  New Stool description:  None produced Relieved by:  Nothing Worsened by:  Nothing Ineffective treatments:  None tried Associated symptoms: abdominal pain and nausea   Associated symptoms: no back pain, no diarrhea, no dysuria, no fever, no flatus and no vomiting   Risk factors comment:  Prior surgery      Past Medical History:  Diagnosis Date  . Abdominal pain   . Atrial fibrillation ([place])   . Barrett's esophagus   . CAD (coronary artery disease)   . Cancer [facility])    prostate  . Chronic kidney disease   . Colon polyp   . CVA (cerebral vascular accident) ([place]) 12/2014  . Diabetes mellitus    w/neuropathy  . Gum disease   . Hiatal hernia   . Hypertension   . Kidney stone    hx of  . Numbness    hands, feet  . OSA on CPAP   . Paresthesia of bilateral legs   . Pleural effusion   . Prostate cancer [facility]) 2007   radiation tx [place]  . PTSD (post-traumatic stress disorder)   . Stroke ([place])   . Vertigo     Patient Active Problem List   Diagnosis Date Noted  . Melena 12/11/2020  . Obstructive sleep apnea 06/20/2017  . Hyperlipidemia 04/17/2016  . Cerebrovascular accident (CVA) due to embolism of left middle cerebral artery ([place]) 04/17/2016  . 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 On amiodarone therapy 03/10/2015  . Stroke ([place]) 12/31/2014  . Dysarthria 12/30/2014  . Chronic systolic heart failure ([place]) 12/28/2014  . Coronary artery disease involving coronary bypass graft of native heart with angina  pectoris ([place]) 02/02/2014  . Paroxysmal atrial fibrillation ([place]) 02/02/2014  . Essential hypertension 02/02/2014  . Chronic anticoagulation 02/02/2014    Past Surgical History:  Procedure Laterality Date  . CATARACT EXTRACTION  2006 and 2007   bilateral  . COLON SURGERY  05/2011   hemicolectomy  . COLONOSCOPY    . CORONARY ARTERY BYPASS GRAFT  2004   x3       Family History  Problem Relation Age of Onset  . Angina Mother   . Diabetes Father   . Coronary artery disease Father   . Stroke Father     Social History   Tobacco Use  . Smoking status: Former Smoker    Years: 26.00    Types: E-cigarettes  . Smokeless tobacco: Never Used  Vaping Use  . Vaping Use: Never used  Substance Use Topics  . Alcohol use: Yes    Alcohol/week: 3.0 standard drinks    Types: 1 Glasses of wine, 1 Cans of beer, 1 Shots of liquor per week    Comment: social  . Drug use: Yes    Types: Marijuana    Comment: frequently--THC    Home Medications Prior to Admission medications   Medication Sig Start Date End Date Taking? Authorizing Provider  acetaminophen (TYLENOL) 325 MG tablet Take 650 mg by mouth every 6 (six) hours as needed for mild pain.  [provider]  amiodarone (PACERONE) 200 MG tablet Take 0.5 tablets (100 mg total) by mouth daily. Patient taking differently: Take 200 mg by mouth daily. 10/26/20   Belva Crome, MD  buPROPion (WELLBUTRIN XL) 150 MG 24 hr tablet Take 150 mg by mouth daily. 10/29/14   [provider]  calcium carbonate (TUMS - DOSED IN MG ELEMENTAL CALCIUM) 500 MG chewable tablet Chew 1 tablet by mouth daily as needed for indigestion or heartburn.    [provider]  Continuous Blood Gluc Receiver ([place]) [place] by Does not apply route.    [provider]  Continuous Blood Gluc Sensor (DEXCOM G6 SENSOR) MISC by Does not apply route.    [provider]  Continuous Blood Gluc Transmit (DEXCOM G6 TRANSMITTER)  MISC See admin instructions. 10/29/20   [provider]  dapagliflozin propanediol (FARXIGA) 10 MG TABS tablet Take 10 mg by mouth daily.    [provider]  escitalopram (LEXAPRO) 20 MG tablet Take 20 mg by mouth daily.    [provider]  esomeprazole (NEXIUM) 40 MG capsule Take 40 mg by mouth daily as needed.    [provider]  Ferrous Sulfate (IRON PO) Take 1 tablet by mouth daily.    [provider]  levocetirizine (XYZAL) 5 MG tablet Take 5 mg by mouth daily. 11/12/18   [provider]  levothyroxine (SYNTHROID) 25 MCG tablet Take 25 mcg by mouth daily. 11/01/20   [provider]  metoprolol succinate (TOPROL-XL) 50 MG 24 hr tablet Take 1 tablet (50 mg total) by mouth daily. Take with or immediately following a meal. 11/30/20   Belva Crome, MD  Omega-3-acid Ethyl Esters (LOVAZA PO) Take 1 tablet by mouth 2 (two) times daily.    [provider]  polyethylene glycol (MIRALAX / GLYCOLAX) 17 g packet Take 17 g by mouth daily as needed for mild constipation.    [provider]  rosuvastatin (CRESTOR) 10 MG tablet Take 10 mg by mouth at bedtime.    [provider]  tamsulosin (FLOMAX) 0.4 MG CAPS capsule Take 0.8 mg by mouth daily.    [provider]  TOVIAZ 8 MG TB24 tablet Take 8 mg by mouth at bedtime. 04/27/16   [provider]  TRESIBA FLEXTOUCH 200 UNIT/ML SOPN Inject 70 Units into the skin at bedtime. 11/11/15   [provider]  TRULICITY 0.25 EN/2.7PO SOPN Inject 0.75 mg into the skin every Tuesday. 10/19/20   [provider]  XARELTO 20 MG TABS tablet TAKE 1 TABLET DAILY Patient taking differently: Take 20 mg by mouth daily. 08/29/20   Belva Crome, MD    Allergies    Aspirin, Byetta 10 mcg pen [exenatide], Invokana [canagliflozin], Levemir [insulin detemir], Nsaids, and Short ragweed pollen ext  Review of Systems   Review of Systems  Constitutional: Positive for  fatigue. Negative for chills, diaphoresis and fever.  HENT: Negative for congestion.   Respiratory: Negative for cough, chest tightness and wheezing.   Cardiovascular: Negative for chest pain and palpitations.  Gastrointestinal: Positive for abdominal distention, abdominal pain, constipation and nausea. Negative for blood in stool, diarrhea, flatus, rectal pain and vomiting.  Genitourinary: Negative for dysuria, frequency, genital sores and hematuria.  Musculoskeletal: Negative for back pain, neck pain and neck stiffness.  Skin: Negative for rash and wound.  Neurological: Positive for light-headedness. Negative for dizziness, weakness and headaches.  Psychiatric/Behavioral: Negative for agitation and decreased concentration.  All other systems  reviewed and are negative.   Physical Exam Updated Vital Signs BP 133/75 (BP Location: Right Arm)   Pulse 60   Temp (!) 97.5 F (36.4 C) (Oral)   Resp 16   Ht 5\' 8"  (1.727 m)   Wt 86.2 kg   SpO2 98%   BMI 28.89 kg/m   Physical Exam Vitals and nursing note reviewed.  Constitutional:      General: He is not in acute distress.    Appearance: He is well-developed. He is not ill-appearing, toxic-appearing or diaphoretic.  HENT:     Head: Normocephalic and atraumatic.     Mouth/Throat:     Mouth: Mucous membranes are moist.     Pharynx: No oropharyngeal exudate or posterior oropharyngeal erythema.  Eyes:     Conjunctiva/sclera: Conjunctivae normal.  Cardiovascular:     Rate and Rhythm: Normal rate and regular rhythm.     Pulses: Normal pulses.     Heart sounds: No murmur heard.   Pulmonary:     Effort: Pulmonary effort is normal. No respiratory distress.     Breath sounds: Normal breath sounds. No wheezing, rhonchi or rales.  Chest:     Chest wall: No tenderness.  Abdominal:     General: There is distension.     Palpations: Abdomen is soft.     Tenderness: There is abdominal tenderness. There is no right CVA tenderness, left CVA  tenderness, guarding or rebound.     Hernia: No hernia is present.  Musculoskeletal:        General: No tenderness.     Cervical back: Neck supple.     Right lower leg: No edema.     Left lower leg: No edema.  Skin:    General: Skin is warm and dry.     Findings: No erythema.  Neurological:     General: No focal deficit present.     Mental Status: He is alert.  Psychiatric:        Mood and Affect: Mood normal.     ED Results / Procedures / Treatments   Labs (all labs ordered are listed, but only abnormal results are displayed) Labs Reviewed  COMPREHENSIVE METABOLIC PANEL - Abnormal; Notable for the following components:      Result Value   Creatinine, Ser 1.87 (*)    Alkaline Phosphatase 31 (*)    GFR, Estimated 37 (*)    All other components within normal limits  URINALYSIS, ROUTINE W REFLEX MICROSCOPIC - Abnormal; Notable for the following components:   Glucose, UA 500 (*)    Protein, ur TRACE (*)    All other components within normal limits  URINE CULTURE  CBC WITH DIFFERENTIAL/PLATELET  LIPASE, BLOOD  LACTIC ACID, PLASMA  LACTIC ACID, PLASMA    EKG None  Radiology CT ABDOMEN PELVIS WO CONTRAST  Result Date: 12/18/2020 CLINICAL DATA:  Abdominal pain with distension and bloating. EXAM: CT ABDOMEN AND PELVIS WITHOUT CONTRAST TECHNIQUE: Multidetector CT imaging of the abdomen and pelvis was performed following the standard protocol without IV contrast. COMPARISON:  April 28, 2020 FINDINGS: Lower chest: A trace amount of scarring and/or atelectasis is seen along the lateral aspect of the left lung base. Hepatobiliary: No focal liver abnormality is seen. Subcentimeter gallstones are seen within the lumen of an otherwise normal-appearing gallbladder. Pancreas: Unremarkable. No pancreatic ductal dilatation or surrounding inflammatory changes. Spleen: Normal in size without focal abnormality. Adrenals/Urinary Tract: Adrenal glands are unremarkable. The left kidney is absent.  Mild compensatory hypertrophy of the  right kidney is seen. A 1.9 cm cyst is noted within the posterior aspect of the mid to upper right kidney. Bladder is unremarkable. Stomach/Bowel: Stomach is within normal limits. There is evidence of prior right hemicolectomy. A large amount of stool is seen throughout the remainder of the large bowel. No evidence of bowel wall thickening, distention, or inflammatory changes. Vascular/Lymphatic: Aortic atherosclerosis. No enlarged abdominal or pelvic lymph nodes. Reproductive: Multiple prostate radiation implantation seeds are seen. Other: No abdominal wall hernia or abnormality. No abdominopelvic ascites. Musculoskeletal: Degenerative changes are seen throughout the lumbar spine. IMPRESSION: 1. Cholelithiasis. 2. Evidence of prior right hemicolectomy. 3. Absent left kidney. 4. Aortic atherosclerosis. Aortic Atherosclerosis (ICD10-I70.0). Electronically Signed   By: Virgina Norfolk M.D.   On: 12/18/2020 16:55    Procedures Procedures   Medications Ordered in ED Medications  sodium chloride 0.9 % bolus 1,000 mL (0 mLs Intravenous Stopped 12/18/20 1912)  sodium phosphate (FLEET) 7-19 GM/118ML enema 1 enema (1 enema Rectal Given 12/18/20 1808)    ED Course  I have reviewed the triage vital signs and the nursing notes.  Pertinent labs & imaging results that were available during my care of the patient were reviewed by me and considered in my medical decision making (see chart for details).    MDM Rules/Calculators/A&P                          Eswin Worrell is a 75 y.o. male with a past medical history significant for paroxysmal atrial fibrillation on Eliquis therapy, hypertension, CHF, prior stroke, diabetes, CAD with CABG, single congenital kidney, prior partial colectomy due to large polyp, prostate cancer status post focused radiation and seeds, and recent mission for GI bleed who presents with worsening abdominal pain, bloating, abdominal distention, decreased  flatus, lightheadedness, fatigue, and constipation.  Patient reports that he was admitted last week for 1 day for concern for GI bleeding but hemoglobin remained stable and GI felt he was safe for discharge home.  He reports that he had around 36 hours of feeling well before he started having abdominal discomfort.  He reports that since admission he has not had a bowel movement and has not been passing gas over the last several days.  He reports his abdomen feels distended and is very sore.  He describes the pain as a 4 out of 10 currently but up to a 7 out of 10 at its worst.  He reports no vomiting but occasionally has some nausea and some belching sensation.  He is concerned about bowel obstruction although he has no history of this.  He does have a history of the abdominal surgery and the procedures on his prostate.  He reports he has had good urination without any dysuria or hematuria seen.  He denies any trauma.  He denies any chest pain, shortness of breath, palpitations.  He does feel fatigued and lightheaded at times.  He denies other complaints on arrival.  On exam, abdomen is distended and slightly tender.  I did hear some tinkling bowel sounds.  No rash seen.  Back nontender.  Lungs clear and chest nontender.  Patient resting comfortably.  Vital signs reassuring on arrival.  Clinically I am most concerned about bowel obstruction.  We will get  screening labs and make him n.p.o.  We will get a CT scan to further evaluate.  Patient has taken contrast in the past despite having a single kidney however, we will check creatinine  first to see if he is able to get IV contrast as well.  Anticipate reassessment after work-up to determine disposition.  5:48 PM CT scan does not show obstruction.  No evidence of large stool ball in the rectum amenable to manual disimpaction.  urinalysis does not show infection and labs are overall reassuring and similar to prior aside from slightly worsened creatinine  likely related to some dehydration.  I suspect his recent upper GI bleed irritated his GI tract causing the slowing of bowel movement.  Patient reports he is already taken some Dulcolax and MiraLAX for the last 2 days.  Patient was encouraged to increase hydration and continue the MiraLAX.  We discussed different options although given his recent GI bleed, we agreed to hold on more significant bowel cleanout regimens and will defer to his GI team.  We did however discuss doing an enema here and giving him some fluids for rehydration to help get things started.  Patient is amenable to this.  We will give enema and see if patient had a bowel movement.  Anticipate discharge after management.  Patient was able to have a large bowel movement and feels much better.  Abdomen feels decompressed and he is having improvement in pain.  Patient will continue his hydration at home and will use the MiraLAX.  He will follow-up with his gastroenterologist and will also see his internist tomorrow.  He had no other questions or concerns and was discharged in good condition with improvement in symptoms.   Final Clinical Impression(s) / ED Diagnoses Final diagnoses:  Generalized abdominal pain  Constipation, unspecified constipation type    Rx / DC Orders ED Discharge Orders    None     Clinical Impression: 1. Generalized abdominal pain   2. Constipation, unspecified constipation type     Disposition: Discharge  Condition: Good  I have discussed the results, Dx and Tx plan with the pt(& family if present). He/she/they expressed understanding and agree(s) with the plan. Discharge instructions discussed at great length. Strict return precautions discussed and pt &/or family have verbalized understanding of the instructions. No further questions at time of discharge.    New Prescriptions   No medications on file    Follow Up: Lavone Orn, MD [address] [place]  [address] [phone]     Your gastroenterologist     [facility] [place] [id number]       Tuyen Uncapher, Gwenyth Allegra, MD 12/18/20 [phone]

## 2020-12-18 NOTE — Discharge Instructions (Signed)
Your imaging today fortunately did not show any evidence of obstruction but did show the large amount of stool.  After the enema and fluids, you were able to pass some stool and appeared to have improvement in your discomfort.  Please continue the MiraLAX and hydration and follow-up with your gastroenterologist this week.  Please see your internist tomorrow.  Please rest and stay hydrated.  If any symptoms change or worsen acutely, please return to the nearest emergency department.

## 2020-12-18 NOTE — ED Notes (Signed)
Pt to CT

## 2020-12-18 NOTE — ED Notes (Signed)
Was see last week for constipation - went home on Monday 3/7 and has not had a bowel movement since then. Has tried laxatives and miralax with no results - [place] N/V, fever and chills

## 2020-12-18 NOTE — ED Notes (Signed)
Pt advised that urine is needed  Given CT contrast to drink - pt advised to consume by 1600

## 2020-12-20 LAB — URINE CULTURE: Culture: NO GROWTH

## 2021-02-24 ENCOUNTER — Other Ambulatory Visit: Payer: Self-pay | Admitting: Interventional Cardiology

## 2021-02-24 MED ORDER — RIVAROXABAN 15 MG PO TABS: 15.0000 mg | ORAL_TABLET | Freq: Every day | ORAL | 1 refills | Status: DC

## 2021-02-24 NOTE — Telephone Encounter (Signed)
-----   Message from Belva Crome, MD sent at 02/24/2021 11:16 AM EDT ----- Regarding: RE: Yes, decrease to 15 mg daily. ----- Message ----- From: Brynda Peon, RN Sent: 02/24/2021   9:48 AM EDT To: Belva Crome, MD Subject: RE:                                            15-50 Xarelto 15mg  QD, above 50 is Xarelto 20mg  QD ----- Message ----- From: Belva Crome, MD Sent: 02/24/2021   9:27 AM EDT To: Brynda Peon, RN Subject: RE:                                            What is the Creatinine clearance cutoff for dose change, <45? ----- Message ----- From: Brynda Peon, RN Sent: 02/24/2021   7:58 AM EDT To: Belva Crome, MD  Pt last saw Dr Tamala Julian 11/30/20, last labs 12/18/20 Creat 1.87, age 76, weight 86.2kg, CrCl 41.61, based on CrCl pt is not on appropriate dosage of Xarelto.   Pt's Creat trends have fluctuated Creat 1.87 on 12/18/20, Creat 1.54 on 12/12/20, Creat 1.83 on 12/11/20.  Please advise if dosage change to 15mg  QD is appropriate given most recent CrCl of 41.61. Thanks

## 2021-02-24 NOTE — Telephone Encounter (Signed)
Per Dr Tamala Julian decrease Xarelto dosge to 15mg  QD.  Called spoke with pt, advised we were going to decrease his dosage of Xarelto from 20mg  QD to 15mg  QD.  Pt verbalized understanding and thanked me for the call.  Advised pt he could finish his current bottle of Xarelto before switching to the 15mg  tablets.

## 2021-02-24 NOTE — Telephone Encounter (Signed)
Pt last saw Dr Tamala Julian 11/30/20, last labs 12/18/20 Creat 1.87, age 75, weight 86.2kg, CrCl 41.61, based on CrCl pt is not on appropriate dosage of Xarelto.  Pt's Creat trends have fluctuated Creat 1.87 on 12/18/20, Creat 1.54 on 12/12/20, Creat 1.83 on 12/11/20.  Will send staff message to Dr Tamala Julian to Please advise if dosage change to 15mg  QD is appropriate given most recent CrCl of 41.61.  Will await response to refill rx.

## 2021-03-14 ENCOUNTER — Telehealth: Payer: Self-pay | Admitting: *Deleted

## 2021-03-14 MED ORDER — AMIODARONE HCL 200 MG PO TABS: 100.0000 mg | ORAL_TABLET | Freq: Every day | ORAL | 0 refills | Status: DC

## 2021-03-14 NOTE — Telephone Encounter (Signed)
Received a message from pt that his mail order for Amiodarone 200 mg hasn't came yet and is running out. Requesting a supply to be sent to [facility].  #15 sent in, which is a 30 day supply, per pt request.

## 2021-04-13 ENCOUNTER — Other Ambulatory Visit: Payer: Self-pay | Admitting: Interventional Cardiology

## 2021-04-27 ENCOUNTER — Telehealth: Payer: Self-pay | Admitting: Diagnostic Neuroimaging

## 2021-04-27 NOTE — Telephone Encounter (Signed)
Contacted pt wife back, she stated to me what was going on with the pt and I informed her of Dr.Penumalli notes from his last visit that she did not attend. I advised her that although he has a upcoming appt with cardiology, she should contact them and relay these symptoms to them as Dr.Penumalli said this concern was followed by them. She was sure that this was not a cardio issue, this was neurologic and we needed to see him immediately even if its not with Dr.Penumalli because he is not "stuck' with him and can be seen by someone else. I went through other providers schedules and there are no openings next week, wife got frustrated, I informed her I could place him on a wait list but if its that sever than I advice seeking emergency services. She demanded he get seen and for me to go thorough other doctors schedule and call her back while she tries to contact cardio. I informed her I will see what else can be done and give her a call back.

## 2021-04-27 NOTE — Telephone Encounter (Signed)
I called the patient.  The patient has had a chronic issue with dizziness with standing, he has had syncopal events in the past and has been worked up through cardiology.  He is still having his usual problem but it may have worsened recently.  He had an event last night where he got dizzy when he stood up and fell down, he is not sure whether or not he might have briefly blacked out.  He does not have dizziness sitting or lying down.  He admits to not drinking a lot of fluids.  He does have the capacity to check his blood pressure at home, I have asked him to check blood pressures sitting and standing, particularly when he is dizzy while standing.  If he remains standing and gets to the dizziness episode, he generally does fairly well.  He has chronic atrial fibrillation which resulted in some fatigue and shortness of breath when he is active.  The patient likely has orthostatic hypotension associated with medications and atrial fibrillation, he is on Flomax and Toviaz.  He was taken off of lisinopril recently.  The patient may benefit from a revisit in the near future to look for orthostatic hypotension, may consider further evaluation looking at cerebrovascular circulation.

## 2021-04-27 NOTE — Telephone Encounter (Signed)
Wife(on DPR) has called to report that every time pt gets up from a sitting position he gets up feeling dizzy and somewhat nauseated.  Wife is asking for a call re: what Dr Leta Baptist will suggest, she states pt has seen his PCP within the last 6-8 weeks, please call.

## 2021-05-02 ENCOUNTER — Telehealth: Payer: Self-pay | Admitting: Interventional Cardiology

## 2021-05-02 NOTE — Telephone Encounter (Signed)
LVM for wife to return call

## 2021-05-02 NOTE — Telephone Encounter (Signed)
Patient wants to know if he should continue to take the medications prescribed by Dr. Tamala Julian. Patient mentions that when he stands, his BP drops about 40%. Also may be dealing with some afib as well. Want to know what to do

## 2021-05-02 NOTE — Telephone Encounter (Signed)
Pt has had multiple episodes of fainting.  States he doesn't go "all the way out" every time but has occasionally. One time woke up on the kitchen floor and one time fell off of a ladder.  States episodes do not last long and he comes right back to.  Denies any other symptoms when this occurs.   Went to PCP last week about this and they did orthostatics.  States BP dropped 25 points when he went from sitting to standing.  Also mentions that blood sugar has been running in the 50s.  Asked if he checks his sugar after fainting spells and he said yes, they are usually 80s-90s after episodes.  Has no idea if he is in AF.  HR today is 82.  Denies any bleeding.  Spoke with Nephro, Dr. Joylene Grapes, and they advised to change Flomax to every other day.  He spoke with PCP today and they told him to hold his [place] tomorrow morning and check his BP and call them.  Pt states he just wanted to make Dr. Tamala Julian aware of what was going on so he wouldn't be surprised at his appt in August.  Advised I am going to send to Dr. Tamala Julian for review and see if he feels like pt needs to be seen sooner than his 06/02/21 appt or if we need to do any testing in the meantime.  Pt appreciative for assistance.    Currently on Amiodarone '100mg'$  QD and Metorpolol Succinate '50mg'$  QD

## 2021-05-03 NOTE — Telephone Encounter (Signed)
Contacted wife again, no answer. Line busy

## 2021-05-03 NOTE — Telephone Encounter (Signed)
Pt c/o BP issue: STAT if pt c/o blurred vision, one-sided weakness or slurred speech  1. What are your last 5 BP readings?  110/72  109  2. Are you having any other symptoms (ex. Dizziness, headache, blurred vision, passed out)?  No  3. What is your BP issue?   Patient is following up to provide an updated BP reading for today, 110/72 109.

## 2021-05-03 NOTE — Telephone Encounter (Signed)
Tamsulosin is contributing. Agree with recommendation to cut back. HR is higher than usual and may be in AF. Okay to stop [place]. Encourage H2O intake. Needs to wear compression stockings. Needs to get an ECG.

## 2021-05-04 ENCOUNTER — Ambulatory Visit (INDEPENDENT_AMBULATORY_CARE_PROVIDER_SITE_OTHER): Payer: Medicare HMO | Admitting: Interventional Cardiology

## 2021-05-04 ENCOUNTER — Encounter: Payer: Self-pay | Admitting: Interventional Cardiology

## 2021-05-04 ENCOUNTER — Other Ambulatory Visit: Payer: Self-pay

## 2021-05-04 VITALS — BP 128/68 | HR 62 | Ht 68.0 in | Wt 201.6 lb

## 2021-05-04 DIAGNOSIS — I10 Essential (primary) hypertension: Secondary | ICD-10-CM

## 2021-05-04 DIAGNOSIS — Z7901 Long term (current) use of anticoagulants: Secondary | ICD-10-CM

## 2021-05-04 DIAGNOSIS — I480 Paroxysmal atrial fibrillation: Secondary | ICD-10-CM | POA: Diagnosis not present

## 2021-05-04 DIAGNOSIS — I25709 Atherosclerosis of coronary artery bypass graft(s), unspecified, with unspecified angina pectoris: Secondary | ICD-10-CM | POA: Diagnosis not present

## 2021-05-04 DIAGNOSIS — E1159 Type 2 diabetes mellitus with other circulatory complications: Secondary | ICD-10-CM | POA: Diagnosis not present

## 2021-05-04 NOTE — Telephone Encounter (Signed)
Spoke with pt and reviewed information from Dr. Tamala Julian.  Scheduled pt to come in today to see Dr. Irish Lack, [place], for EKG and eval.  Pt agreeable to plan.

## 2021-05-04 NOTE — [facility] Notes (Signed)
Cardiology Office Note   Date:  05/04/2021   ID:  Walter [name], Walter [name] 1946/05/10, MRN [id number]  PCP:  Lavone Orn, MD    No chief complaint on file.  AFib/CAD  Wt Readings from Last 3 Encounters:  05/04/21 201 lb 9.6 oz (91.4 kg)  12/18/20 190 lb (86.2 kg)  12/11/20 200 lb (90.7 kg)       History of Present Illness: Walter [name] is a 75 y.o. male  who sees Dr. Tamala Julian.  He is a [place] veteran and made it out of the [employer] on 9/11.  He has an extensive cardiac history.  Prior records show: "hx of paroxysmal atrial fibrillation, CAD with prior CABG, hypertension, CKD 2, chronic anticoagulation therapy, prior embolic CVA, and amiodarone therapy. "  He had a near syncopal episode while walking out of the kitchen earlier this week.  He has been getting dizzy with standing up. His balance has been off.  He does not think he fully passed out.  He did not trip on anything, but just got very lightheaded.   He was concerned he had gone into AFib.    Currently, he feels fine.    He had some issues with low BP and Farxiga several months ago. ACE-I was held.  He admits to not drinking much water despite staying active.     Past Medical History:  Diagnosis Date   Abdominal pain    Atrial fibrillation (HCC)    Barrett's esophagus    CAD (coronary artery disease)    Cancer (HCC)    prostate   Chronic kidney disease    Colon polyp    CVA (cerebral vascular accident) ([place]) 12/2014   Diabetes mellitus    w/neuropathy   Gum disease    Hiatal hernia    Hypertension    Kidney stone    hx of   Numbness    hands, feet   OSA on CPAP    Paresthesia of bilateral legs    Pleural effusion    Prostate cancer ([place]) 2007   radiation tx [place]   PTSD (post-traumatic stress disorder)    Stroke [facility])    Vertigo     Past Surgical History:  Procedure Laterality Date   CATARACT EXTRACTION  2006 and 2007   bilateral   COLON SURGERY  05/2011   hemicolectomy   COLONOSCOPY      CORONARY ARTERY BYPASS GRAFT  2004   x3     Current Outpatient Medications  Medication Sig Dispense Refill   acetaminophen (TYLENOL) 325 MG tablet Take 650 mg by mouth every 6 (six) hours as needed for mild pain.     amiodarone (PACERONE) 200 MG tablet TAKE 1/2 TABLET BY MOUTH DAILY 45 tablet 1   buPROPion (WELLBUTRIN XL) 150 MG 24 hr tablet Take 150 mg by mouth daily.  0   Continuous Blood Gluc Receiver ([place]) DEVI by Does not apply route.     Continuous Blood Gluc Sensor (DEXCOM G6 SENSOR) MISC by Does not apply route.     Continuous Blood Gluc Transmit (DEXCOM G6 TRANSMITTER) MISC See admin instructions.     esomeprazole (NEXIUM) 40 MG capsule Take 40 mg by mouth daily as needed.     levothyroxine (SYNTHROID) 50 MCG tablet Take 50 mcg by mouth every morning.     metoprolol succinate (TOPROL-XL) 50 MG 24 hr tablet Take 1 tablet (50 mg total) by mouth daily. Take with or immediately following a meal. 90  tablet 3   Omega-3-acid Ethyl Esters (LOVAZA PO) Take 1 tablet by mouth 2 (two) times daily.     Rivaroxaban (XARELTO) 15 MG TABS tablet Take 1 tablet (15 mg total) by mouth daily with supper. 90 tablet 1   rosuvastatin (CRESTOR) 10 MG tablet Take 10 mg by mouth at bedtime.     tamsulosin (FLOMAX) 0.4 MG CAPS capsule Take 0.8 mg by mouth every other day.     TRESIBA FLEXTOUCH 200 UNIT/ML SOPN Inject 70 Units into the skin at bedtime.     TRULICITY A999333 0000000 SOPN Inject 0.75 mg into the skin every Tuesday.     calcium carbonate (TUMS - DOSED IN MG ELEMENTAL CALCIUM) 500 MG chewable tablet Chew 1 tablet by mouth daily as needed for indigestion or heartburn. (Patient not taking: Reported on 05/04/2021)     escitalopram (LEXAPRO) 20 MG tablet Take 20 mg by mouth daily. (Patient not taking: Reported on 05/04/2021)     No current facility-administered medications for this visit.    Allergies:   Aspirin, Byetta 10 mcg pen [exenatide], Invokana [canagliflozin], Levemir [insulin  detemir], Nsaids, and Short ragweed pollen ext    Social History:  The patient  reports that he has quit smoking. His smoking use included e-cigarettes. He has never used smokeless tobacco. He reports current alcohol use of about 3.0 standard drinks of alcohol per week. He reports current drug use. Drug: Marijuana.   Family History:  The patient's family history includes Angina in his mother; Coronary artery disease in his father; Diabetes in his father; Stroke in his father.    ROS:  Please see the history of present illness.   Otherwise, review of systems are positive for recent fall.   All other systems are reviewed and negative.    PHYSICAL EXAM: VS:  BP 128/68   Pulse 62   Ht '5\' 8"'$  (1.727 m)   Wt 201 lb 9.6 oz (91.4 kg)   SpO2 95%   BMI 30.65 kg/m  , BMI Body mass index is 30.65 kg/m. GEN: Well nourished, well developed, in no acute distress HEENT: normal Neck: no JVD, carotid bruits, or masses Cardiac: RRR; no murmurs, rubs, or gallops,no edema  Respiratory:  clear to auscultation bilaterally, normal work of breathing GI: soft, nontender, nondistended, + BS MS: no deformity or atrophy Skin: warm and dry, no rash Neuro:  Strength and sensation are intact Psych: euthymic mood, full affect   EKG:   The ekg ordered today demonstrates NSR, nonspecific ST changes, narrow QRS   Recent Labs: 12/18/2020: ALT 29; BUN 19; Creatinine, Ser 1.87; Hemoglobin 14.5; Platelets 252; Potassium 4.5; Sodium 142   Lipid Panel    Component Value Date/Time   CHOL 109 02/26/2019 0824   TRIG 126 02/26/2019 0824   HDL 51 02/26/2019 0824   CHOLHDL 2.1 02/26/2019 0824   CHOLHDL 3.5 12/31/2014 0555   VLDL 42 (H) 12/31/2014 0555   LDLCALC 33 02/26/2019 0824     Other studies Reviewed: Additional studies/ records that were reviewed today with results demonstrating: labs reviewed, prior records reviewed; Cr 1.7 range.   ASSESSMENT AND PLAN:  Presyncope/near syncope:  HTN is controlled ;  BP normal today.  I encouraged him to stay hydrated. We discussed a monitor to look for rhythm issues but he declined.   He will let us know if sx return.  He was reassured that he was in NSR by ECG today.  AFib: In NSR on Amio 100 mg daily.  Anticoagulated: Xarelto for stroke prevention.  DM: Uses Dexcom.  I asked him to set the Dexcom to alert for low blood sugar less than 80, to reduce risk of hypoglycemic symptoms.    Current medicines are reviewed at length with the patient today.  The patient concerns regarding his medicines were addressed.  The following changes have been made:  No change  Labs/ tests ordered today include:  No orders of the defined types were placed in this encounter.   Recommend 150 minutes/week of aerobic exercise Low fat, low carb, high fiber diet recommended  Disposition:   FU in with Dr. Tamala Julian as scheduled   Signed, Larae Grooms, MD  05/04/2021 4:14 PM    [facility] [place], [place], [place]  [address] Phone: [phone]; Fax: [phone]

## 2021-05-04 NOTE — Patient Instructions (Signed)
Medication Instructions:  Your physician recommends that you continue on your current medications as directed. Please refer to the Current Medication list given to you today.  *If you need a refill on your cardiac medications before your next appointment, please call your pharmacy*   Lab Work: none If you have labs (blood work) drawn today and your tests are completely normal, you will receive your results only by: [place] (if you have MyChart) OR A paper copy in the mail If you have any lab test that is abnormal or we need to change your treatment, we will call you to review the results.   Testing/Procedures: none   Follow-Up: At [facility], you and your health needs are our priority.  As part of our continuing mission to provide you with exceptional heart care, we have created designated Provider Care Teams.  These Care Teams include your primary Cardiologist (physician) and Advanced Practice Providers (APPs -  Physician Assistants and Nurse Practitioners) who all work together to provide you with the care you need, when you need it.  We recommend signing up for the patient portal called "MyChart".  Sign up information is provided on this After Visit Summary.  MyChart is used to connect with patients for Virtual Visits (Telemedicine).  Patients are able to view lab/test results, encounter notes, upcoming appointments, etc.  Non-urgent messages can be sent to your provider as well.   To learn more about what you can do with MyChart, go to [email or web address].    Your next appointment:   In August as planned  The format for your next appointment:   In Person  Provider:   Daneen Schick, MD   Other Instructions

## 2021-05-15 MED ORDER — AMIODARONE HCL 100 MG PO TABS: 100.0000 mg | ORAL_TABLET | Freq: Every day | ORAL | 3 refills | Status: DC

## 2021-05-31 NOTE — [place] Notes (Signed)
Cardiology Office Note:    Date:  06/02/2021   ID:  Walter [name], DOB September 26, 1946, MRN [id number]  PCP:  Lavone Orn, MD  Cardiologist:  Sinclair Grooms, MD   Referring MD: Lavone Orn, MD   Chief Complaint  Patient presents with   Coronary Artery Disease   Congestive Heart Failure    History of Present Illness:    Walter [name] is a 75 y.o. male with a hx of  paroxysmal atrial fibrillation, CAD with prior CABG, hypertension, CKD 2, chronic anticoagulation therapy, prior embolic CVA, and amiodarone therapy. Recent near syncope. ACE stopped. Saw JV in follow-up.   He is doing well.  He did have an episode of syncope which led to discontinuation of ACE inhibitor therapy as noted above.  He still has some dizziness.  Blood pressure still tend to run relatively low.  He has had increasing episodes of fluttering and palpitations that are reminiscent of prior atrial fibrillation.  He has not needed sublingual nitroglycerin.  Denies lower extremity swelling.  No transient neurological symptoms.  Past Medical History:  Diagnosis Date   Abdominal pain    Atrial fibrillation (HCC)    Barrett's esophagus    CAD (coronary artery disease)    Cancer (HCC)    prostate   Chronic kidney disease    Colon polyp    CVA (cerebral vascular accident) ([place]) 12/2014   Diabetes mellitus    w/neuropathy   Gum disease    Hiatal hernia    Hypertension    Kidney stone    hx of   Numbness    hands, feet   OSA on CPAP    Paresthesia of bilateral legs    Pleural effusion    Prostate cancer ([place]) 2007   radiation tx [place]   PTSD (post-traumatic stress disorder)    Stroke [facility])    Vertigo     Past Surgical History:  Procedure Laterality Date   CATARACT EXTRACTION  2006 and 2007   bilateral   COLON SURGERY  05/2011   hemicolectomy   COLONOSCOPY     CORONARY ARTERY BYPASS GRAFT  2004   x3    Current Medications: Current Meds  Medication Sig   acetaminophen (TYLENOL) 325 MG tablet  Take 650 mg by mouth every 6 (six) hours as needed for mild pain.   amiodarone (PACERONE) 200 MG tablet Take 1 tablet (200 mg total) by mouth daily.   buPROPion (WELLBUTRIN XL) 150 MG 24 hr tablet Take 150 mg by mouth daily.   calcium carbonate (TUMS - DOSED IN MG ELEMENTAL CALCIUM) 500 MG chewable tablet Chew 1 tablet by mouth daily as needed for indigestion or heartburn.   Continuous Blood Gluc Receiver ([place]) DEVI by Does not apply route.   Continuous Blood Gluc Sensor (DEXCOM G6 SENSOR) MISC by Does not apply route.   Continuous Blood Gluc Transmit (DEXCOM G6 TRANSMITTER) MISC See admin instructions.   escitalopram (LEXAPRO) 20 MG tablet Take 20 mg by mouth daily.   esomeprazole (NEXIUM) 40 MG capsule Take 40 mg by mouth daily as needed.   fesoterodine (TOVIAZ) 8 MG TB24 tablet Take 8 mg by mouth daily.   levothyroxine (SYNTHROID) 75 MCG tablet Take 75 mcg by mouth every morning.   metoprolol succinate (TOPROL XL) 25 MG 24 hr tablet Take 1 tablet (25 mg total) by mouth daily.   Omega-3-acid Ethyl Esters (LOVAZA PO) Take 1 tablet by mouth 2 (two) times daily.   Rivaroxaban (XARELTO) 15  MG TABS tablet Take 1 tablet (15 mg total) by mouth daily with supper.   rosuvastatin (CRESTOR) 10 MG tablet Take 10 mg by mouth at bedtime.   tamsulosin (FLOMAX) 0.4 MG CAPS capsule Take 0.8 mg by mouth every other day.   TRESIBA FLEXTOUCH 200 UNIT/ML SOPN Inject 70 Units into the skin at bedtime.   TRULICITY A999333 0000000 SOPN Inject 0.75 mg into the skin every Tuesday.   [DISCONTINUED] amiodarone (PACERONE) 100 MG tablet Take 1 tablet (100 mg total) by mouth daily.   [DISCONTINUED] metoprolol succinate (TOPROL-XL) 50 MG 24 hr tablet Take 1 tablet (50 mg total) by mouth daily. Take with or immediately following a meal.     Allergies:   Aspirin, Byetta 10 mcg pen [exenatide], Invokana [canagliflozin], Levemir [insulin detemir], Nsaids, and Short ragweed pollen ext   Social History    Socioeconomic History   Marital status: Married    Spouse name: Public affairs consultant   Number of children: 2   Years of education: BFA   Highest education level: Not on file  Occupational History   Occupation: Retired     Comment: retired Engineer, technical sales  Tobacco Use   Smoking status: Former    Types: E-cigarettes   Smokeless tobacco: Never  Scientific laboratory technician Use: Never used  Substance and Sexual Activity   Alcohol use: Yes    Alcohol/week: 3.0 standard drinks    Types: 1 Glasses of wine, 1 Cans of beer, 1 Shots of liquor per week    Comment: social   Drug use: Yes    Types: Marijuana    Comment: frequently--THC   Sexual activity: Not on file  Other Topics Concern   Not on file  Social History Narrative   Left handed    Lives with wife   Caffeine -2 cups per day   Social Determinants of Health   Financial Resource Strain: Not on file  Food Insecurity: Not on file  Transportation Needs: Not on file  Physical Activity: Not on file  Stress: Not on file  Social Connections: Not on file     Family History: The patient's family history includes Angina in his mother; Coronary artery disease in his father; Diabetes in his father; Stroke in his father.  ROS:   Please see the history of present illness.    Wants to know if he can get off some of his current medication had COVID-19 3 months ago.  Have any erectile dysfunction.  All other systems reviewed and are negative.  EKGs/Labs/Other Studies Reviewed:    The following studies were reviewed today: Cardiac event monitor on 10/21/2019: Study Highlights  NSR and SB Paroxysmal atrial fibrillation, < 1% burden. No ventricular ectopy  EKG:  EKG not performed today  Recent Labs: 12/18/2020: ALT 29; BUN 19; Creatinine, Ser 1.87; Hemoglobin 14.5; Platelets 252; Potassium 4.5; Sodium 142  Recent Lipid Panel    Component Value Date/Time   CHOL 109 02/26/2019 0824   TRIG 126 02/26/2019 0824   HDL 51 02/26/2019 0824   CHOLHDL 2.1  02/26/2019 0824   CHOLHDL 3.5 12/31/2014 0555   VLDL 42 (H) 12/31/2014 0555   LDLCALC 33 02/26/2019 0824    Physical Exam:    VS:  BP (!) 102/58   Pulse (!) 57   Ht '5\' 7"'$  (1.702 m)   Wt 198 lb 3.2 oz (89.9 kg)   SpO2 98%   BMI 31.04 kg/m     Wt Readings from Last 3 Encounters:  06/02/21  198 lb 3.2 oz (89.9 kg)  05/04/21 201 lb 9.6 oz (91.4 kg)  12/18/20 190 lb (86.2 kg)     GEN: Overweight. No acute distress HEENT: Normal NECK: No JVD. LYMPHATICS: No lymphadenopathy CARDIAC: Soft left parasternal systolic murmur. RRR no gallop, or edema. VASCULAR:  Normal Pulses. No bruits. RESPIRATORY:  Clear to auscultation without rales, wheezing or rhonchi  ABDOMEN: Soft, non-tender, non-distended, No pulsatile mass, MUSCULOSKELETAL: No deformity  SKIN: Warm and dry NEUROLOGIC:  Alert and oriented x 3 PSYCHIATRIC:  Normal affect   ASSESSMENT:    1. 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 Paroxysmal atrial fibrillation ([place])   3. Chronic anticoagulation   4. Type 2 diabetes mellitus with other circulatory complication, without long-term current use of insulin ([place])   5. Essential hypertension   6. Chronic systolic heart failure ([place])   7. OSA on CPAP   8. On amiodarone therapy    PLAN:    In order of problems listed above:  Secondary prevention discussed Increase amiodarone to 400 mg/day for 1 week and then decrease to 200 mg daily thereafter.  Will need a new prescription for 200 mg tablets to be taken daily.  After the 200 mg/day starts, it is advised that he decrease metoprolol succinate to 25 mg once per day. Continue Eliquis therapy Continue current therapy Blood pressures are relatively low.  Decreasing metoprolol to 25 mg/day and potentially discontinuing at some point. Will need to remain on therapy for systolic heart failure however he has been having syncope leading to discontinuation of lisinopril and  reduction in the dose of Toprol-XL.  2D Doppler echocardiogram will be done to update LV function. Continue with CPAP compliance TSH, liver panel, and PA lateral chest x-ray in 6 months.  Amnio dose has been increased to 200 mg/day.   Medication Adjustments/Labs and Tests Ordered: Current medicines are reviewed at length with the patient today.  Concerns regarding medicines are outlined above.  No orders of the defined types were placed in this encounter.  Meds ordered this encounter  Medications   amiodarone (PACERONE) 200 MG tablet    Sig: Take 1 tablet (200 mg total) by mouth daily.    Dispense:  90 tablet    Refill:  3    Dose change   metoprolol succinate (TOPROL XL) 25 MG 24 hr tablet    Sig: Take 1 tablet (25 mg total) by mouth daily.    Dispense:  90 tablet    Refill:  3    Dose change    Patient Instructions  Medication Instructions:  1) INCREASE Amiodarone to '400mg'$  once daily for one week, then decrease to '200mg'$  once daily 2) When you start Amiodarone '200mg'$  once daily, DECREASE Metoprolol Succinate to '25mg'$  once daily  *If you need a refill on your cardiac medications before your next appointment, please call your pharmacy*   Lab Work: None If you have labs (blood work) drawn today and your tests are completely normal, you will receive your results only by: [place] (if you have MyChart) OR A paper copy in the mail If you have any lab test that is abnormal or we need to change your treatment, we will call you to review the results.   Testing/Procedures: None   Follow-Up: At [facility], you and your health needs are our priority.  As part of our continuing mission to provide you with exceptional heart care, we have created  designated Provider Care Teams.  These Care Teams include your primary Cardiologist (physician) and Advanced Practice Providers (APPs -  Physician Assistants and Nurse Practitioners) who all work together to provide you with the care  you need, when you need it.  We recommend signing up for the patient portal called "MyChart".  Sign up information is provided on this After Visit Summary.  MyChart is used to connect with patients for Virtual Visits (Telemedicine).  Patients are able to view lab/test results, encounter notes, upcoming appointments, etc.  Non-urgent messages can be sent to your provider as well.   To learn more about what you can do with MyChart, go to [email or web address].    Your next appointment:   6 month(s)  The format for your next appointment:   In Person  Provider:   You may see Sinclair Grooms, MD or one of the following Advanced Practice Providers on your designated Care Team:   Cecilie Kicks, NP   Other Instructions     Signed, Sinclair Grooms, MD  06/02/2021 10:51 AM    [place]

## 2021-06-02 ENCOUNTER — Ambulatory Visit (INDEPENDENT_AMBULATORY_CARE_PROVIDER_SITE_OTHER): Payer: Medicare HMO | Admitting: Interventional Cardiology

## 2021-06-02 ENCOUNTER — Other Ambulatory Visit: Payer: Self-pay

## 2021-06-02 ENCOUNTER — Encounter: Payer: Self-pay | Admitting: Interventional Cardiology

## 2021-06-02 VITALS — BP 102/58 | HR 57 | Ht 67.0 in | Wt 198.2 lb

## 2021-06-02 DIAGNOSIS — Z7901 Long term (current) use of anticoagulants: Secondary | ICD-10-CM

## 2021-06-02 DIAGNOSIS — I25709 Atherosclerosis of coronary artery bypass graft(s), unspecified, with unspecified angina pectoris: Secondary | ICD-10-CM

## 2021-06-02 DIAGNOSIS — E1159 Type 2 diabetes mellitus with other circulatory complications: Secondary | ICD-10-CM | POA: Diagnosis not present

## 2021-06-02 DIAGNOSIS — G4733 Obstructive sleep apnea (adult) (pediatric): Secondary | ICD-10-CM

## 2021-06-02 DIAGNOSIS — Z79899 Other long term (current) drug therapy: Secondary | ICD-10-CM

## 2021-06-02 DIAGNOSIS — I480 Paroxysmal atrial fibrillation: Secondary | ICD-10-CM | POA: Diagnosis not present

## 2021-06-02 DIAGNOSIS — I5022 Chronic systolic (congestive) heart failure: Secondary | ICD-10-CM

## 2021-06-02 DIAGNOSIS — I10 Essential (primary) hypertension: Secondary | ICD-10-CM

## 2021-06-02 DIAGNOSIS — Z9989 Dependence on other enabling machines and devices: Secondary | ICD-10-CM

## 2021-06-02 MED ORDER — AMIODARONE HCL 200 MG PO TABS: 200.0000 mg | ORAL_TABLET | Freq: Every day | ORAL | 3 refills | Status: DC

## 2021-06-02 MED ORDER — METOPROLOL SUCCINATE ER 25 MG PO TB24: 25.0000 mg | ORAL_TABLET | Freq: Every day | ORAL | 3 refills | Status: DC

## 2021-06-02 NOTE — Addendum Note (Signed)
Addended by: Loren Racer on: 06/02/2021 10:59 AM   Modules accepted: Orders

## 2021-06-02 NOTE — Patient Instructions (Signed)
Medication Instructions:  1) INCREASE Amiodarone to '400mg'$  once daily for one week, then decrease to '200mg'$  once daily 2) When you start Amiodarone '200mg'$  once daily, DECREASE Metoprolol Succinate to '25mg'$  once daily  *If you need a refill on your cardiac medications before your next appointment, please call your pharmacy*   Lab Work: None If you have labs (blood work) drawn today and your tests are completely normal, you will receive your results only by: [place] (if you have MyChart) OR A paper copy in the mail If you have any lab test that is abnormal or we need to change your treatment, we will call you to review the results.   Testing/Procedures: None   Follow-Up: At [facility], you and your health needs are our priority.  As part of our continuing mission to provide you with exceptional heart care, we have created designated Provider Care Teams.  These Care Teams include your primary Cardiologist (physician) and Advanced Practice Providers (APPs -  Physician Assistants and Nurse Practitioners) who all work together to provide you with the care you need, when you need it.  We recommend signing up for the patient portal called "MyChart".  Sign up information is provided on this After Visit Summary.  MyChart is used to connect with patients for Virtual Visits (Telemedicine).  Patients are able to view lab/test results, encounter notes, upcoming appointments, etc.  Non-urgent messages can be sent to your provider as well.   To learn more about what you can do with MyChart, go to [email or web address].    Your next appointment:   6 month(s)  The format for your next appointment:   In Person  Provider:   You may see Sinclair Grooms, MD or one of the following Advanced Practice Providers on your designated Care Team:   Cecilie Kicks, NP   Other Instructions

## 2021-06-05 ENCOUNTER — Telehealth: Payer: Self-pay

## 2021-06-05 NOTE — Telephone Encounter (Signed)
error 

## 2021-07-03 ENCOUNTER — Encounter: Payer: Self-pay | Admitting: Diagnostic Neuroimaging

## 2021-07-03 ENCOUNTER — Encounter: Payer: Self-pay | Admitting: *Deleted

## 2021-07-03 ENCOUNTER — Ambulatory Visit (INDEPENDENT_AMBULATORY_CARE_PROVIDER_SITE_OTHER): Payer: Medicare HMO | Admitting: Diagnostic Neuroimaging

## 2021-07-03 ENCOUNTER — Other Ambulatory Visit: Payer: Self-pay | Admitting: *Deleted

## 2021-07-03 VITALS — BP 115/73 | HR 74 | Ht 67.0 in | Wt 201.0 lb

## 2021-07-03 DIAGNOSIS — R200 Anesthesia of skin: Secondary | ICD-10-CM

## 2021-07-03 NOTE — Progress Notes (Addendum)
[facility]  PATIENT: Walter [name] DOB: 1946-01-25  REFERRING CLINICIAN: Lavone Orn, MD HISTORY FROM: patient  REASON FOR VISIT: new consult    HISTORICAL  CHIEF COMPLAINT:  Chief Complaint  Patient presents with   Numbness    Rm 6 alone Pt is well, has been having numbness in both hands and feet for yrs, recently has gotten worse.    HISTORY OF PRESENT ILLNESS:   UPDATE (07/03/21, VRP): Since last visit, here for eval of bilateral hand numbness since 10-15 years. Tried wrists splints without relief. 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 Also mentions history of his parents who are holocaust survivors.   PRIOR HPI (5025): 75 year old male here for evaluation of dizziness, numbness, balance and gait difficulty.  Patient had stroke in March 2016 due to left MCA infarct.  Patient had symptoms of balance and dizziness since that time.  Patient mentioned these to Dr. Erlinda Hong in stroke clinic in 2017.  Continues to have complaints of memory, dizziness, balance, weakness, especially when he stands up.  He feels like his balance is off.  Also patient has had 2 episodes of passing out in the last 3 years.  He has followed up with cardiology with his.   REVIEW OF SYSTEMS: Full 14 system review of systems performed and negative with exception of: as per HPI.  ALLERGIES: Allergies  Allergen Reactions   Aspirin Other (See Comments)   Byetta 10 Mcg Pen [Exenatide] Nausea Only   Invokana [Canagliflozin] Other (See Comments)    Orthostatic hypotension   Levemir [Insulin Detemir] Other (See Comments)    Burning at injection site   Nsaids    Short Ragweed Pollen Ext Cough    HOME MEDICATIONS: Outpatient Medications Prior to Visit  Medication Sig Dispense Refill   amiodarone (PACERONE) 100 MG tablet Take 100 mg by mouth daily.     amiodarone (PACERONE) 200 MG tablet Take 1 tablet (200 mg total) by mouth daily. 90 tablet 3    buPROPion (WELLBUTRIN XL) 150 MG 24 hr tablet Take 150 mg by mouth daily.  0   calcium carbonate (TUMS - DOSED IN MG ELEMENTAL CALCIUM) 500 MG chewable tablet Chew 1 tablet by mouth daily as needed for indigestion or heartburn.     Continuous Blood Gluc Receiver ([place]) DEVI by Does not apply route.     Continuous Blood Gluc Sensor (DEXCOM G6 SENSOR) MISC by Does not apply route.     Continuous Blood Gluc Transmit (DEXCOM G6 TRANSMITTER) MISC See admin instructions.     escitalopram (LEXAPRO) 20 MG tablet Take 20 mg by mouth daily.     fesoterodine (TOVIAZ) 8 MG TB24 tablet Take 8 mg by mouth daily.     levothyroxine (SYNTHROID) 75 MCG tablet Take 75 mcg by mouth every morning.     metoprolol succinate (TOPROL XL) 25 MG 24 hr tablet Take 1 tablet (25 mg total) by mouth daily. 90 tablet 3   Omega-3-acid Ethyl Esters (LOVAZA PO) Take 1 tablet by mouth 2 (two) times daily.     Rivaroxaban (XARELTO) 15 MG TABS tablet Take 1 tablet (15 mg total) by mouth daily with supper. 90 tablet 1   rosuvastatin (CRESTOR) 10 MG tablet Take 10 mg by mouth at bedtime.     tamsulosin (FLOMAX) 0.4 MG CAPS capsule Take 0.8 mg by mouth every other day.     TRESIBA FLEXTOUCH 200 UNIT/ML SOPN Inject 70  Units into the skin at bedtime.     TRULICITY 1.5 VH/8.4ON SOPN Inject 1.5 mg into the skin once a week.     acetaminophen (TYLENOL) 325 MG tablet Take 650 mg by mouth every 6 (six) hours as needed for mild pain.     esomeprazole (NEXIUM) 40 MG capsule Take 40 mg by mouth daily as needed.     No facility-administered medications prior to visit.    PAST MEDICAL HISTORY: Past Medical History:  Diagnosis Date   Abdominal pain    Atrial fibrillation (HCC)    Barrett's esophagus    CAD (coronary artery disease)    Cancer (HCC)    prostate   Chronic kidney disease    Colon polyp    CVA (cerebral vascular accident) ([place]) 12/2014   Diabetes mellitus    w/neuropathy   Gum disease    Hiatal hernia     Hypertension    Kidney stone    hx of   Numbness    hands, feet   OSA on CPAP    Paresthesia of bilateral legs    Pleural effusion    Prostate cancer ([place]) 2007   radiation tx [place]   PTSD (post-traumatic stress disorder)    Stroke ([place])    Vertigo     PAST SURGICAL HISTORY: Past Surgical History:  Procedure Laterality Date   CATARACT EXTRACTION  2006 and 2007   bilateral   COLON SURGERY  05/2011   hemicolectomy   COLONOSCOPY     CORONARY ARTERY BYPASS GRAFT  2004   x3    FAMILY HISTORY: Family History  Problem Relation Age of Onset   Angina Mother    Diabetes Father    Coronary artery disease Father    Stroke Father     SOCIAL HISTORY: Social History   Socioeconomic History   Marital status: Married    Spouse name: Public affairs consultant   Number of children: 2   Years of education: BFA   Highest education level: Not on file  Occupational History   Occupation: Retired     Comment: retired Engineer, technical sales  Tobacco Use   Smoking status: Former    Types: E-cigarettes   Smokeless tobacco: Never  Vaping Use   Vaping Use: Never used  Substance and Sexual Activity   Alcohol use: Yes    Alcohol/week: 3.0 standard drinks    Types: 1 Glasses of wine, 1 Cans of beer, 1 Shots of liquor per week    Comment: social   Drug use: Not Currently    Types: Marijuana    Comment: frequently--THC   Sexual activity: Not on file  Other Topics Concern   Not on file  Social History Narrative   Left handed    Lives with wife   Caffeine -2 cups per day   Social Determinants of Health   Financial Resource Strain: Not on file  Food Insecurity: Not on file  Transportation Needs: Not on file  Physical Activity: Not on file  Stress: Not on file  Social Connections: Not on file  Intimate Partner Violence: Not on file     PHYSICAL EXAM  GENERAL EXAM/CONSTITUTIONAL: Vitals:  Vitals:   07/03/21 0922  BP: 115/73  Pulse: 74  Weight: 201 lb (91.2 kg)  Height: 5\' 7"  (1.702 m)   Body mass  index is 31.48 kg/m. Wt Readings from Last 3 Encounters:  07/03/21 201 lb (91.2 kg)  06/02/21 198 lb 3.2 oz (89.9 kg)  05/04/21 201 lb 9.6 oz (  91.4 kg)   Patient is in no distress; well developed, nourished and groomed; neck is supple  CARDIOVASCULAR: Examination of carotid arteries is normal; no carotid bruits Regular rate and rhythm, no murmurs Examination of peripheral vascular system by observation and palpation is normal  EYES: Ophthalmoscopic exam of optic discs and posterior segments is normal; no papilledema or hemorrhages No results found.  MUSCULOSKELETAL: Gait, strength, tone, movements noted in Neurologic exam below  NEUROLOGIC: MENTAL STATUS:  No flowsheet data found. awake, alert, oriented to person, place and time recent and remote memory intact normal attention and concentration language fluent, comprehension intact, naming intact fund of knowledge appropriate  CRANIAL NERVE:  2nd - no papilledema on fundoscopic exam 2nd, 3rd, 4th, 6th - pupils equal and reactive to light, visual fields full to confrontation, extraocular muscles intact, no nystagmus 5th - facial sensation symmetric 7th - facial strength symmetric 8th - hearing intact 9th - palate elevates symmetrically, uvula midline 11th - shoulder shrug symmetric 12th - tongue protrusion midline  MOTOR:  normal bulk and tone, full strength in the BUE, BLE ATROPHY AND WEAKNESS OF BILATERAL APB  SENSORY:  normal and symmetric to light touch, temperature, vibration DECR IN DIGITS 1-3  COORDINATION:  finger-nose-finger, fine finger movements normal  REFLEXES:  deep tendon reflexes TRACE and symmetric  GAIT/STATION:  narrow based gait    DIAGNOSTIC DATA (LABS, IMAGING, TESTING) - I reviewed patient records, labs, notes, testing and imaging myself where available.  Lab Results  Component Value Date   WBC 6.8 12/18/2020   HGB 14.5 12/18/2020   HCT 42.0 12/18/2020   MCV 90.7 12/18/2020    PLT 252 12/18/2020      Component Value Date/Time   NA 142 12/18/2020 1527   NA 138 02/26/2019 0824   K 4.5 12/18/2020 1527   CL 105 12/18/2020 1527   CO2 31 12/18/2020 1527   GLUCOSE 73 12/18/2020 1527   BUN 19 12/18/2020 1527   BUN 23 02/26/2019 0824   CREATININE 1.87 (H) 12/18/2020 1527   CREATININE 1.34 (H) 07/19/2016 1015   CALCIUM 9.1 12/18/2020 1527   PROT 6.5 12/18/2020 1527   PROT 6.5 02/26/2019 0824   ALBUMIN 3.7 12/18/2020 1527   ALBUMIN 4.1 02/26/2019 0824   AST 28 12/18/2020 1527   ALT 29 12/18/2020 1527   ALKPHOS 31 (L) 12/18/2020 1527   BILITOT 0.7 12/18/2020 1527   BILITOT 0.6 02/26/2019 0824   GFRNONAA 37 (L) 12/18/2020 1527   GFRAA 53 (L) 02/26/2019 0824   Lab Results  Component Value Date   CHOL 109 02/26/2019   HDL 51 02/26/2019   LDLCALC 33 02/26/2019   TRIG 126 02/26/2019   CHOLHDL 2.1 02/26/2019   Lab Results  Component Value Date   HGBA1C 6.8 (H) 12/11/2020   Lab Results  Component Value Date   [id number] 267 12/31/2014   Lab Results  Component Value Date   TSH 6.740 (H) 03/31/2019    12/31/14 MRI brain [I reviewed images myself and agree with interpretation. -VRP]  1. Acute/subacute cortical infarcts involving the left frontal operculum and to lesser extent the left precentral gyrus. 2. Mild atrophy and white matter changes otherwise reflect the sequelae of chronic microvascular ischemia.    ASSESSMENT AND PLAN  75 y.o. year old male here with:  Dx:  1. Hand numbness       PLAN:  BILATERAL HAND NUMBNESS - check EMG/NCS; eval for CTS  GENERAL BALANCE DIFFICULTY (since 2016; due to diabetic neuropathy and  history of stroke in 2016) - continue PT exercises  Orders Placed This Encounter  Procedures   NCV with EMG(electromyography)   Return for for NCV/EMG.    [name], MD 0/34/9611, 64:35 AM Certified in Neurology, Neurophysiology and Neuroimaging  [facility] [address], [place], [place] [address] [phone]

## 2021-08-07 ENCOUNTER — Institutional Professional Consult (permissible substitution): Payer: Medicare HMO | Admitting: Diagnostic Neuroimaging

## 2021-08-24 ENCOUNTER — Ambulatory Visit (INDEPENDENT_AMBULATORY_CARE_PROVIDER_SITE_OTHER): Payer: Medicare HMO | Admitting: Diagnostic Neuroimaging

## 2021-08-24 ENCOUNTER — Encounter (INDEPENDENT_AMBULATORY_CARE_PROVIDER_SITE_OTHER): Payer: Medicare HMO | Admitting: Diagnostic Neuroimaging

## 2021-08-24 ENCOUNTER — Other Ambulatory Visit: Payer: Self-pay

## 2021-08-24 DIAGNOSIS — Z0289 Encounter for other administrative examinations: Secondary | ICD-10-CM

## 2021-08-24 DIAGNOSIS — R200 Anesthesia of skin: Secondary | ICD-10-CM

## 2021-08-24 NOTE — Procedures (Signed)
[facility]  NCS (NERVE CONDUCTION STUDY) WITH EMG (ELECTROMYOGRAPHY) REPORT   STUDY DATE: 08/24/21 PATIENT NAME: Walter [name] DOB: [birth year] MRN: [id number]  ORDERING CLINICIAN: Andrey Spearman, MD   TECHNOLOGIST: Sherre Scarlet ELECTROMYOGRAPHER: Earlean Polka. Ajene Carchi, MD  CLINICAL INFORMATION: 75 year old male with bilateral hand numbness and weakness.  Evaluate for carpal tunnel syndrome.  FINDINGS: NERVE CONDUCTION STUDY:  Right median motor response has prolonged distal latency (12.1 ms) decreased amplitude and slow conduction velocity (37 m/s)>  Left median motor response could not be obtained.  Right ulnar motor response is normal.  Right ulnar F-wave latency is normal.  Bilateral median sensory responses could not be obtained.  Right ulnar sensory response is normal.   NEEDLE ELECTROMYOGRAPHY:  Needle examination of right upper extremity is normal.   IMPRESSION:   Abnormal study demonstrating: - Bilateral median neuropathies at the wrist consistent with bilateral carpal tunnel syndrome (moderate severe on the right and severe on the left).    INTERPRETING PHYSICIAN:  [name], MD Certified in Neurology, Neurophysiology and Neuroimaging  [facility] [address], [place], [place] [address] [phone]   [facility]    Nerve / Sites Muscle Latency Ref. Amplitude Ref. Rel Amp Segments Distance Velocity Ref. Area    ms ms mV mV %  cm m/s m/s mVms  R Median - APB     Wrist APB 12.1 ?4.4 1.5 ?4.0 100 Wrist - APB 7   5.6     Upper arm APB 17.5  1.2  79.1 Upper arm - Wrist 20 37 ?49 6.0  L Median - APB     Wrist APB NR ?4.4 NR ?4.0 NR Wrist - APB 7   NR     Upper arm APB NR  NR  NR Upper arm - Wrist 21 NR ?49 NR  R Ulnar - ADM     Wrist ADM 2.9 ?3.3 7.2 ?6.0 100 Wrist - ADM 7   18.2     B.Elbow ADM 6.5  6.6  91.9 B.Elbow - Wrist 18 49 ?49 17.0     A.Elbow ADM 8.6  6.2  93.9 A.Elbow - B.Elbow 10 49 ?49 16.5            SNC    Nerve / Sites Rec. Site Peak Lat Ref.  Amp Ref. Segments Distance    ms ms V V  cm  R Median - Orthodromic (Dig II, Mid palm)     Dig II Wrist NR ?3.4 NR ?10 Dig II - Wrist 13  L Median - Orthodromic (Dig II, Mid palm)     Dig II Wrist NR ?3.4 NR ?10 Dig II - Wrist 13  R Ulnar - Orthodromic, (Dig V, Mid palm)     Dig V Wrist 3.1 ?3.1 5 ?5 Dig V - Wrist 52           F  Wave    Nerve F Lat Ref.   ms ms  R Ulnar - ADM 30.7 ?32.0       EMG Summary Table    Spontaneous MUAP Recruitment  Muscle IA Fib PSW Fasc Other Amp Dur. Poly Pattern  R. Deltoid Normal None None None _______ Normal Normal Normal Normal  R. Biceps brachii Normal None None None _______ Normal Normal Normal Normal  R. Triceps brachii Normal None None None _______ Normal Normal Normal Normal  R. Flexor carpi radialis Normal None None None _______ Normal Normal Normal Normal  R. First dorsal  interosseous Normal None None None _______ Normal Normal Normal Normal

## 2021-08-24 NOTE — Progress Notes (Signed)
Orders Placed This Encounter  Procedures   Ambulatory referral to [place], MD 83/16/7425, 5:25 PM Certified in Neurology, Neurophysiology and [facility] [address] [place], [place] [address] [phone]

## 2021-09-07 ENCOUNTER — Telehealth: Payer: Self-pay | Admitting: *Deleted

## 2021-09-07 NOTE — Telephone Encounter (Signed)
   Pre-operative Risk Assessment    Patient Name: Walter [name]  DOB: [birth year] MRN: [id number]      Request for Surgical Clearance   Procedure:   LEFT ENDOSCOPIC CARPAL TUNNEL RELEASE   Date of Surgery: Clearance 09/15/21                                 Surgeon:  DR. Milly Jakob Surgeon's Group or Practice Name:  [facility] Phone number:  [phone] Fax number:  [phone] ATTN SANDRA GONZALEZ   Type of Clearance Requested: - Medical  - Pharmacy:  Hold Rivaroxaban (Xarelto)     Type of Anesthesia:   MAC   Additional requests/questions:   Jiles Prows   09/07/2021, 4:07 PM

## 2021-09-07 NOTE — Telephone Encounter (Signed)
Pharmacy, can you please comment on how long Xarelto can be held for upcoming procedure?  Thank you! 

## 2021-09-08 NOTE — Telephone Encounter (Signed)
Patient with diagnosis of afib on Xarelto for anticoagulation.    Procedure:  LEFT ENDOSCOPIC CARPAL TUNNEL RELEASE   Date of procedure: 09/15/21  CHA2DS2-VASc Score = 8   This indicates a 10.8% annual risk of stroke. The patient's score is based upon: CHF History: 1 HTN History: 1 Diabetes History: 1 Stroke History: 2 Vascular Disease History: 1 Age Score: 2 Gender Score: 0     CrCl 36.7 ml/min  Patient had an acute CVA while holding Xarelto for 48hr prior to cardiac cath in 2016. I will defer clearance to Dr. Tamala Julian.

## 2021-09-08 NOTE — Telephone Encounter (Signed)
Left message to call back at 11:46 AM on 09/08/2021.  Patient needs call back.

## 2021-09-13 ENCOUNTER — Encounter: Payer: Self-pay | Admitting: Interventional Cardiology

## 2021-09-13 ENCOUNTER — Encounter: Payer: Self-pay | Admitting: *Deleted

## 2021-09-13 NOTE — Telephone Encounter (Signed)
   Name: Walter [name]  DOB: [birth year]  MRN: [id number]   Primary Cardiologist: Sinclair Grooms, MD  Chart reviewed as part of pre-operative protocol coverage. Patient was contacted 09/13/2021 in reference to pre-operative risk assessment for pending surgery as outlined below.  Walter [name] was last seen on 06/02/21 by Dr. Tamala Julian.  He can complete 4.0 METS without angina.   We are asked to hold xarelto for procedure.   Per our clinical pharmacist: Patient had an acute CVA while holding Xarelto for 48hr prior to cardiac cath in 2016. I will defer clearance to Dr. Tamala Julian  Per Dr. Tamala Julian: He should not have the procedure unless absolutely necessary due to concern about CVA while off anticoagulation. Needs to be shared decision making conversation to clearly align risk vs benefit.   I reached out to the patient to discuss risk. He understands risk of stroke and reiterates that his carpal tunnel is "severe." He also reports forgetting to take xarelto for one day without adverse events. The original preop clearance from the surgeon does not state how long they need to hold xarelto. It would be ideal if surgery could be completed without interruption.   Based on ACC/AHA guidelines, the patient would be at acceptable risk for the planned procedure without further cardiovascular testing.   The patient was advised that if he develops new symptoms prior to surgery to contact our office to arrange for a follow-up visit, and he verbalized understanding.  I will route this recommendation to the requesting party via Epic fax function and remove from pre-op pool. Please call with questions.  Walter Lin Kavontae Pritchard, PA 09/13/2021, 4:05 PM

## 2021-09-27 ENCOUNTER — Encounter: Payer: Self-pay | Admitting: Interventional Cardiology

## 2021-10-19 NOTE — Telephone Encounter (Signed)
Our office received a fax today for the same clearance listed below as of 09/07/21.  See all notes and MY CHART messages in relation to clearance 09/07/21.  Notes today on the fx are as written word for word.  DR. Tamala Julian DID NOT CLEAR THIS PATIENT FOR SURGERY BACK IN December. DR. Grandville Silos  IS AWARE OF THIS AND WOULD LIKE TO KNOW WHAT IS THE SAFEST ROUTE WOULD BE (IF ANY) FOR THE PATIENT, IF THEY WERE TO HAVE THIS SURGERY. PLEASE ADVISE. [place]  I am going to re-send this clearance to the pre op pool for further recommendations.

## 2021-10-19 NOTE — Telephone Encounter (Signed)
Await Dr. Thompson Caul evaluation, I am not sure if there is a way to reduce the overall risk in this case. The question remains whether the patient deem the surgery absolutely necessary and willing to tolerate the higher risk for stroke. Previous note sent to [place] was blunt and more of the question than answer. I do apologize.

## 2021-10-19 NOTE — Telephone Encounter (Signed)
Per pre op provider Almyra Deforest, [facility] who states to send this to Dr. Tamala Julian. Message from [place], [facility]: please forward that to Dr. Tamala Julian, I am not sure what is the answer they expect in this case. The surgeon and the patient have to make the call whether this procedure is considered absolutely necessary.

## 2021-10-23 NOTE — Telephone Encounter (Signed)
This should be a low bleed risk procedure too - could also see if MD performing procedure is ok with shorter 1 day Xarelto hold. He should resume as soon as safely possible after to decrease his stroke risk given prior stroke during periprocedural anticoag interruption.

## 2021-10-23 NOTE — Telephone Encounter (Signed)
° °  Primary Cardiologist: Sinclair Grooms, MD  Chart reviewed as part of pre-operative protocol coverage. Given past medical history and time since last visit, based on ACC/AHA guidelines, Londen Bok would be at acceptable risk for the planned procedure without further cardiovascular testing.   Patient with diagnosis of afib on Xarelto for anticoagulation.     Procedure:  LEFT ENDOSCOPIC CARPAL TUNNEL RELEASE    CHA2DS2-VASc Score = 8   This indicates a 10.8% annual risk of stroke. The patient's score is based upon: CHF History: 1 HTN History: 1 Diabetes History: 1 Stroke History: 2 Vascular Disease History: 1 Age Score: 2 Gender Score: 0      CrCl 36.7 ml/min   Per Dr. Tamala Julian " The risk of stroke off anticoagulation is elevated and previously demonstrated during interruption for cardiac cath. Okay to hold Xarelto for 48 hours prior to surgery and resume therapy when safe, if patient and performing Physician understand and accept risk. He could bridge with Lovenox but I fear this will increase bleeding risk."  I will route this recommendation to the requesting party via [place] fax function and remove from pre-op pool.  Please call with questions.  Jossie Ng. Lilianna Case NP-C    10/23/2021, 7:53 AM [place] [address] Office [phone] Fax [phone]

## 2021-11-18 ENCOUNTER — Other Ambulatory Visit: Payer: Self-pay | Admitting: Interventional Cardiology

## 2021-11-20 ENCOUNTER — Other Ambulatory Visit: Payer: Self-pay | Admitting: *Deleted

## 2021-11-21 ENCOUNTER — Encounter: Payer: Self-pay | Admitting: Interventional Cardiology

## 2021-11-28 ENCOUNTER — Other Ambulatory Visit: Payer: Self-pay

## 2021-11-28 ENCOUNTER — Ambulatory Visit (HOSPITAL_COMMUNITY): Payer: Medicare HMO | Attending: Cardiovascular Disease

## 2021-11-28 DIAGNOSIS — I480 Paroxysmal atrial fibrillation: Secondary | ICD-10-CM | POA: Diagnosis not present

## 2021-11-28 DIAGNOSIS — I5022 Chronic systolic (congestive) heart failure: Secondary | ICD-10-CM | POA: Insufficient documentation

## 2021-11-28 LAB — ECHOCARDIOGRAM COMPLETE
Area-P 1/2: 2.94 cm2
S' Lateral: 2.4 cm

## 2021-11-30 NOTE — Progress Notes (Signed)
Cardiology Office Note:    Date:  12/01/2021   ID:  Walter [name], DOB 04/01/1946, MRN [id number]  PCP:  Lavone Orn, MD  Cardiologist:  Sinclair Grooms, MD   Referring MD: Lavone Orn, MD   Chief Complaint  Patient presents with   Coronary Artery Disease   Congestive Heart Failure   Shortness of Breath    History of Present Illness:    Walter [name] is a 76 y.o. male with a hx of paroxysmal atrial fibrillation, CAD with prior CABG, hypertension, CKD 2, chronic anticoagulation therapy, prior embolic CVA, chronic systolic HF, near syncope on ACEI therapy, and amiodarone therapy.   His dentist data panoramagram noticed carotid plaque.  He denies angina.  He feels great.  He denies orthopnea and PND.  He does have orthostatic dizziness.  He understands the correlation between the dizziness and the tamsulosin that he takes.  Past Medical History:  Diagnosis Date   Abdominal pain    Atrial fibrillation (HCC)    Barrett's esophagus    CAD (coronary artery disease)    Cancer (HCC)    prostate   Chronic kidney disease    Colon polyp    CVA (cerebral vascular accident) ([place]) 12/2014   Diabetes mellitus    w/neuropathy   Gum disease    Hiatal hernia    Hypertension    Kidney stone    hx of   Numbness    hands, feet   OSA on CPAP    Paresthesia of bilateral legs    Pleural effusion    Prostate cancer ([place]) 2007   radiation tx [place]   PTSD (post-traumatic stress disorder)    Stroke [facility])    Vertigo     Past Surgical History:  Procedure Laterality Date   CATARACT EXTRACTION  2006 and 2007   bilateral   COLON SURGERY  05/2011   hemicolectomy   COLONOSCOPY     CORONARY ARTERY BYPASS GRAFT  2004   x3    Current Medications: Current Meds  Medication Sig   amiodarone (PACERONE) 200 MG tablet Take 1 tablet (200 mg total) by mouth daily.   buPROPion (WELLBUTRIN XL) 150 MG 24 hr tablet Take 150 mg by mouth daily.   calcium carbonate (TUMS - DOSED IN MG ELEMENTAL  CALCIUM) 500 MG chewable tablet Chew 1 tablet by mouth daily as needed for indigestion or heartburn.   Continuous Blood Gluc Receiver ([place]) DEVI by Does not apply route.   Continuous Blood Gluc Sensor (DEXCOM G6 SENSOR) MISC by Does not apply route.   Continuous Blood Gluc Transmit (DEXCOM G6 TRANSMITTER) MISC See admin instructions.   escitalopram (LEXAPRO) 20 MG tablet Take 20 mg by mouth daily.   fesoterodine (TOVIAZ) 8 MG TB24 tablet Take 8 mg by mouth daily.   levothyroxine (SYNTHROID) 88 MCG tablet Take 88 mcg by mouth every morning.   metoprolol succinate (TOPROL XL) 25 MG 24 hr tablet Take 1 tablet (25 mg total) by mouth daily.   Omega-3-acid Ethyl Esters (LOVAZA PO) Take 1 tablet by mouth 2 (two) times daily.   Rivaroxaban (XARELTO) 15 MG TABS tablet Take 1 tablet (15 mg total) by mouth daily with supper.   rosuvastatin (CRESTOR) 10 MG tablet Take 10 mg by mouth at bedtime.   tamsulosin (FLOMAX) 0.4 MG CAPS capsule Take 0.8 mg by mouth every other day.   TRESIBA FLEXTOUCH 200 UNIT/ML SOPN Inject 70 Units into the skin at bedtime.   TRULICITY 1.5  MG/0.5ML SOPN Inject 1.5 mg into the skin once a week.   [DISCONTINUED] levothyroxine (SYNTHROID) 75 MCG tablet Take 75 mcg by mouth every morning.     Allergies:   Aspirin, Byetta 10 mcg pen [exenatide], Invokana [canagliflozin], Levemir [insulin detemir], Nsaids, and Short ragweed pollen ext   Social History   Socioeconomic History   Marital status: Married    Spouse name: Public affairs consultant   Number of children: 2   Years of education: BFA   Highest education level: Not on file  Occupational History   Occupation: Retired     Comment: retired Engineer, technical sales  Tobacco Use   Smoking status: Former    Types: E-cigarettes   Smokeless tobacco: Never  Scientific laboratory technician Use: Never used  Substance and Sexual Activity   Alcohol use: Yes    Alcohol/week: 3.0 standard drinks    Types: 1 Glasses of wine, 1 Cans of beer, 1 Shots of liquor per  week    Comment: social   Drug use: Not Currently    Types: Marijuana    Comment: frequently--THC   Sexual activity: Not on file  Other Topics Concern   Not on file  Social History Narrative   Left handed    Lives with wife   Caffeine -2 cups per day   Social Determinants of Health   Financial Resource Strain: Not on file  Food Insecurity: Not on file  Transportation Needs: Not on file  Physical Activity: Not on file  Stress: Not on file  Social Connections: Not on file     Family History: The patient's family history includes Angina in his mother; Coronary artery disease in his father; Diabetes in his father; Stroke in his father.  ROS:   Please see the history of present illness.    He had successful left carpal tunnel release.  Hand function has significantly improved.  He has overflow incontinence if he does not take tamsulosin.  He does have occasional orthostatic dizziness.  All other systems reviewed and are negative.  EKGs/Labs/Other Studies Reviewed:    The following studies were reviewed today:  2 D Doppler ECHOCARDIOGRAM 2023: IMPRESSIONS  1. Left ventricular ejection fraction, by estimation, is 60 to 65%. The left ventricle has normal function. The left ventricle has no regional wall motion abnormalities. There is mild left ventricular hypertrophy. Left ventricular diastolic parameters  were normal.  2. Right ventricular systolic function is normal. The right ventricular size is normal. There is normal pulmonary artery systolic pressure.  3. Left atrial size was moderately dilated.  4. The mitral valve is abnormal. Trivial mitral valve regurgitation. No evidence of mitral stenosis.  5. The aortic valve is tricuspid. There is mild calcification of the aortic valve. Aortic valve regurgitation is not visualized. Aortic valve sclerosis is present, with no evidence of aortic valve stenosis.  6. Aortic dilatation noted. There is mild dilatation of the ascending aorta,  measuring 41 mm.  7. The inferior vena cava is normal in size with greater than 50% respiratory variability, suggesting right atrial pressure of 3 mmHg.  EKG:  EKG not repeated.  Recent Labs: 12/18/2020: ALT 29; BUN 19; Creatinine, Ser 1.87; Hemoglobin 14.5; Platelets 252; Potassium 4.5; Sodium 142  Recent Lipid Panel    Component Value Date/Time   CHOL 109 02/26/2019 0824   TRIG 126 02/26/2019 0824   HDL 51 02/26/2019 0824   CHOLHDL 2.1 02/26/2019 0824   CHOLHDL 3.5 12/31/2014 0555   VLDL 42 (H) 12/31/2014 0555  [place] 33 02/26/2019 0824    Physical Exam:    VS:  BP 98/60    Pulse 69    Ht 5\' 7"  (1.702 m)    Wt 193 lb 6.4 oz (87.7 kg)    SpO2 97%    BMI 30.29 kg/m     Wt Readings from Last 3 Encounters:  12/01/21 193 lb 6.4 oz (87.7 kg)  07/03/21 201 lb (91.2 kg)  06/02/21 198 lb 3.2 oz (89.9 kg)     GEN: Overweight. No acute distress HEENT: Normal NECK: No JVD. LYMPHATICS: No lymphadenopathy CARDIAC: No murmur. RRR no gallop, or edema. VASCULAR:  Normal Pulses. No bruits. RESPIRATORY:  Clear to auscultation without rales, wheezing or rhonchi  ABDOMEN: Soft, non-tender, non-distended, No pulsatile mass, MUSCULOSKELETAL: No deformity  SKIN: Warm and dry NEUROLOGIC:  Alert and oriented x 3 PSYCHIATRIC:  Normal affect   ASSESSMENT:    1. Paroxysmal atrial fibrillation (HCC)   2. Chronic systolic heart failure ([place])   3. Coronary artery disease involving coronary bypass graft of native heart with angina pectoris ([place])   4. Stenosis of carotid artery, unspecified laterality   5. Chronic anticoagulation   6. Type 2 diabetes mellitus with other circulatory complication, without long-term current use of insulin ([place])   7. Essential hypertension   8. OSA on CPAP   9. On amiodarone therapy    PLAN:    In order of problems listed above:  Asymptomatic.  No clinical recurrence Systolic function is returned to normal.  Continue beta-blocker therapy. Preventive therapy  with management of diabetes, blood pressure, and lipids.  Encourage physical activity. Bilateral carotid Doppler study Continue Xarelto Hemoglobin A1c is less than 6 on dulaglutide Blood pressure is perfect.  I repeated the blood pressure with the patient sitting and it was 122/70 mmHg. Compliance with CPAP is encouraged We will need TSH and liver panel when returns in 8 to 9 months.   Overall education and awareness concerning primary/secondary risk prevention was discussed in detail: LDL less than 70, hemoglobin A1c less than 7, blood pressure target less than 130/80 mmHg, >150 minutes of moderate aerobic activity per week, avoidance of smoking, weight control (via diet and exercise), and continued surveillance/management of/for obstructive sleep apnea.    Medication Adjustments/Labs and Tests Ordered: Current medicines are reviewed at length with the patient today.  Concerns regarding medicines are outlined above.  Orders Placed This Encounter  Procedures   VAS US CAROTID   No orders of the defined types were placed in this encounter.   Patient Instructions  Medication Instructions:  Your physician recommends that you continue on your current medications as directed. Please refer to the Current Medication list given to you today.  *If you need a refill on your cardiac medications before your next appointment, please call your pharmacy*   Lab Work: None If you have labs (blood work) drawn today and your tests are completely normal, you will receive your results only by: [place] (if you have MyChart) OR A paper copy in the mail If you have any lab test that is abnormal or we need to change your treatment, we will call you to review the results.   Testing/Procedures: Your physician has requested that you have a carotid duplex. This test is an ultrasound of the carotid arteries in your neck. It looks at blood flow through these arteries that supply the brain with blood.  Allow one hour for this exam. There are no restrictions or special instructions.  Follow-Up: At [facility], you and your health needs are our priority.  As part of our continuing mission to provide you with exceptional heart care, we have created designated Provider Care Teams.  These Care Teams include your primary Cardiologist (physician) and Advanced Practice Providers (APPs -  Physician Assistants and Nurse Practitioners) who all work together to provide you with the care you need, when you need it.  We recommend signing up for the patient portal called "MyChart".  Sign up information is provided on this After Visit Summary.  MyChart is used to connect with patients for Virtual Visits (Telemedicine).  Patients are able to view lab/test results, encounter notes, upcoming appointments, etc.  Non-urgent messages can be sent to your provider as well.   To learn more about what you can do with MyChart, go to [email or web address].    Your next appointment:   9 month(s)  The format for your next appointment:   In Person  Provider:   Sinclair Grooms, MD    Other Instructions     Signed, Sinclair Grooms, MD  12/01/2021 10:35 AM    [place]

## 2021-12-01 ENCOUNTER — Other Ambulatory Visit: Payer: Self-pay

## 2021-12-01 ENCOUNTER — Encounter: Payer: Self-pay | Admitting: Interventional Cardiology

## 2021-12-01 ENCOUNTER — Ambulatory Visit (INDEPENDENT_AMBULATORY_CARE_PROVIDER_SITE_OTHER): Payer: Medicare HMO | Admitting: Interventional Cardiology

## 2021-12-01 VITALS — BP 98/60 | HR 69 | Ht 67.0 in | Wt 193.4 lb

## 2021-12-01 DIAGNOSIS — Z9989 Dependence on other enabling machines and devices: Secondary | ICD-10-CM

## 2021-12-01 DIAGNOSIS — I480 Paroxysmal atrial fibrillation: Secondary | ICD-10-CM | POA: Diagnosis not present

## 2021-12-01 DIAGNOSIS — E1159 Type 2 diabetes mellitus with other circulatory complications: Secondary | ICD-10-CM

## 2021-12-01 DIAGNOSIS — I6529 Occlusion and stenosis of unspecified carotid artery: Secondary | ICD-10-CM

## 2021-12-01 DIAGNOSIS — I5022 Chronic systolic (congestive) heart failure: Secondary | ICD-10-CM

## 2021-12-01 DIAGNOSIS — I25709 Atherosclerosis of coronary artery bypass graft(s), unspecified, with unspecified angina pectoris: Secondary | ICD-10-CM

## 2021-12-01 DIAGNOSIS — Z7901 Long term (current) use of anticoagulants: Secondary | ICD-10-CM

## 2021-12-01 DIAGNOSIS — G4733 Obstructive sleep apnea (adult) (pediatric): Secondary | ICD-10-CM

## 2021-12-01 DIAGNOSIS — Z79899 Other long term (current) drug therapy: Secondary | ICD-10-CM

## 2021-12-01 DIAGNOSIS — I10 Essential (primary) hypertension: Secondary | ICD-10-CM

## 2021-12-01 NOTE — Patient Instructions (Signed)
Medication Instructions:  Your physician recommends that you continue on your current medications as directed. Please refer to the Current Medication list given to you today.  *If you need a refill on your cardiac medications before your next appointment, please call your pharmacy*   Lab Work: None If you have labs (blood work) drawn today and your tests are completely normal, you will receive your results only by: [place] (if you have MyChart) OR A paper copy in the mail If you have any lab test that is abnormal or we need to change your treatment, we will call you to review the results.   Testing/Procedures: Your physician has requested that you have a carotid duplex. This test is an ultrasound of the carotid arteries in your neck. It looks at blood flow through these arteries that supply the brain with blood. Allow one hour for this exam. There are no restrictions or special instructions.   Follow-Up: At [facility], you and your health needs are our priority.  As part of our continuing mission to provide you with exceptional heart care, we have created designated Provider Care Teams.  These Care Teams include your primary Cardiologist (physician) and Advanced Practice Providers (APPs -  Physician Assistants and Nurse Practitioners) who all work together to provide you with the care you need, when you need it.  We recommend signing up for the patient portal called "MyChart".  Sign up information is provided on this After Visit Summary.  MyChart is used to connect with patients for Virtual Visits (Telemedicine).  Patients are able to view lab/test results, encounter notes, upcoming appointments, etc.  Non-urgent messages can be sent to your provider as well.   To learn more about what you can do with MyChart, go to [email or web address].    Your next appointment:   9 month(s)  The format for your next appointment:   In Person  Provider:   Sinclair Grooms, MD     Other Instructions

## 2021-12-28 ENCOUNTER — Encounter (HOSPITAL_COMMUNITY): Payer: Medicare HMO

## 2021-12-28 ENCOUNTER — Other Ambulatory Visit: Payer: Self-pay

## 2021-12-28 ENCOUNTER — Ambulatory Visit (HOSPITAL_COMMUNITY)
Admission: RE | Admit: 2021-12-28 | Discharge: 2021-12-28 | Disposition: A | Discharge status: Home / Self Care | Payer: Medicare HMO | Source: Ambulatory Visit | Attending: Cardiology | Admitting: Cardiology

## 2021-12-28 DIAGNOSIS — I6529 Occlusion and stenosis of unspecified carotid artery: Secondary | ICD-10-CM | POA: Insufficient documentation

## 2021-12-28 DIAGNOSIS — I6523 Occlusion and stenosis of bilateral carotid arteries: Secondary | ICD-10-CM

## 2022-02-02 ENCOUNTER — Other Ambulatory Visit: Payer: Self-pay | Admitting: Interventional Cardiology

## 2022-02-02 ENCOUNTER — Encounter: Payer: Self-pay | Admitting: Interventional Cardiology

## 2022-02-02 DIAGNOSIS — I480 Paroxysmal atrial fibrillation: Secondary | ICD-10-CM

## 2022-02-02 DIAGNOSIS — I4891 Unspecified atrial fibrillation: Secondary | ICD-10-CM

## 2022-02-02 NOTE — Telephone Encounter (Addendum)
Prescription refill request for Xarelto received.  ?Indication:Afib  ?Last office visit: 12/01/21 Tamala Julian) ?Weight: 87.7kg ?Age: 76 ?Scr: 1.45 (10/24/21 via [place])  ?CrCl:  53.68m/min ? ?Per doing criteria, pt's dose should be increased to '20mg'$ . Per MMarcelle Overlie Pharm D, pt's labs should be re-drawn before dose change since Scr fluctuates. Called pt, made him aware. CBC/BMET ordered and lab appt scheduled for 02/06/22.  ? ? ? ? ? ? ? ? ?

## 2022-02-06 ENCOUNTER — Other Ambulatory Visit: Payer: Self-pay

## 2022-02-06 ENCOUNTER — Other Ambulatory Visit: Payer: Medicare HMO | Admitting: *Deleted

## 2022-02-06 DIAGNOSIS — I4891 Unspecified atrial fibrillation: Secondary | ICD-10-CM

## 2022-02-06 MED ORDER — RIVAROXABAN 15 MG PO TABS: 15.0000 mg | ORAL_TABLET | Freq: Every day | ORAL | 0 refills | Status: DC

## 2022-02-06 NOTE — Telephone Encounter (Addendum)
Discussed with Walter [name] in an effort to prevent pt from running out of Xarelto will send in 30 day supply to pharmacy.  90 day supply was sent in error, called pharmacy spoke with pharmacist and changed amount to 30 tablets only. Called spoke with pt's wife, advised refill has been sent to pharmacy for 30 tablets to prevent any missed dosages.  Advised to make sure pt keeps lab appt for today so we can assess his dosage and sent in additional refills for this medication. My cahrt message sent in response to pt as well regarding Xarelto refill.  ?

## 2022-02-06 NOTE — Telephone Encounter (Signed)
Ovid Curd Schrag "[name]"  [place] Triage (supporting Belva Crome, MD) 14 hours ago (5:39 PM)  ? ?Hi Cathy, I'm now down to two more Xarelto 15 mg. Pharmacy says they don't have any prescription refills on file and have not received a refill prescription from your office. I checked on my chart online and Xarelto '15mg'$  is not on my list of meds. Please send an emergency refill request to [facility] ASAP.  I need more of these NLT Wednesday. Pls advise after reading this message. ?  ? ?Carter Kitten D, CMA  [facility] Ripberger "[name]" 4 days ago  ? ?Hello Mr. Badley, do you need refills? If so, please list the names of your medications. If your pharmacy needs to talk with our office, please have them contact our office. If you have any other problems, questions or concerns, please give our office a call. ?  ?Thank you, Cathy,CMA ?  ? ?Precious Gilding, RN routed conversation to [facility] [employer] Refills 4 days ago  ? ?Ovid Curd Tilmon "[name]"  [place] Triage (supporting Belva Crome, MD) 4 days ago  ? ?Received the message [facility] from [facility], they requested a call from your office. Looks like they've got my scripts out of sync. Pls contact them. ?

## 2022-02-06 NOTE — Telephone Encounter (Signed)
Pt last saw Dr Tamala Julian 12/01/21, pt is overdue for labwork.  Pt has an appt in [place] for 02/06/22 to have labs drawn order placed by Lisette Abu, RN.  Pt is running out of Xarelto and needs a refill prior to this lab appt.  Pt is 75, weight 87.7kg, last Creat 12/18/20 Creat 1.87, CrCl 41.69, based on previous labwork pt is on appropriate dosage of Xarelto '15mg'$  QD Will refill rx x 1 to get pt to upcoming lab appointment on 02/06/22. Will advise pt in my chart message rx has been sent, and pt must keep labwork appt for future refills.  ?

## 2022-02-06 NOTE — Telephone Encounter (Signed)
Refill sent to pharmacy, x 1 only since pt did not have any Xarelto to take for today, and pt requested an emergency refill yesterday at 5:39p via my chart communication.  Will advise to keep appt for labwork today 02/06/22 to make sure pt is on the correct dosage of Xarelto, and will send in future refills based on those results.   ?

## 2022-02-06 NOTE — Telephone Encounter (Signed)
Note ?Prescription refill request for Xarelto received.  ?Indication:Afib  ?Last office visit: 12/01/21 Tamala Julian) ?Weight: 87.7kg ?Age: 76 ?Scr: 1.45 (10/24/21 via [place])  ?CrCl:  59.75m/min ?  ?Per doing criteria, pt's dose should be increased to '20mg'$ . Per MMarcelle Overlie Pharm D, pt's labs should be re-drawn before dose change since Scr fluctuates. Called pt, made him aware. CBC/BMET ordered and lab appt scheduled for 02/06/22.   ?  ?Discussed with MMarcelle Overliein an effort to prevent pt from running out of Xarelto will send in 30 day supply to pharmacy.  90 day supply was sent in error, called pharmacy spoke with pharmacist and changed amount to 30 tablets only. Called spoke with pt's wife, advised refill has been sent to pharmacy for 30 tablets to prevent any missed dosages.  Advised to make sure pt keeps lab appt for today so we can assess his dosage and sent in additional refills for this medication.  ?

## 2022-02-07 LAB — CBC
Hematocrit: 36 % — ABNORMAL LOW (ref 37.5–51.0)
Hemoglobin: 12.7 g/dL — ABNORMAL LOW (ref 13.0–17.7)
MCH: 31.2 pg (ref 26.6–33.0)
MCHC: 35.3 g/dL (ref 31.5–35.7)
MCV: 89 fL (ref 79–97)
Platelets: 239 10*3/uL (ref 150–450)
RBC: 4.07 x10E6/uL — ABNORMAL LOW (ref 4.14–5.80)
RDW: 13.1 % (ref 11.6–15.4)
WBC: 7.2 10*3/uL (ref 3.4–10.8)

## 2022-02-07 LAB — BASIC METABOLIC PANEL
BUN/Creatinine Ratio: 11 (ref 10–24)
BUN: 17 mg/dL (ref 8–27)
CO2: 26 mmol/L (ref 20–29)
Calcium: 9.3 mg/dL (ref 8.6–10.2)
Chloride: 103 mmol/L (ref 96–106)
Creatinine, Ser: 1.57 mg/dL — ABNORMAL HIGH (ref 0.76–1.27)
Glucose: 177 mg/dL — ABNORMAL HIGH (ref 70–99)
Potassium: 4.2 mmol/L (ref 3.5–5.2)
Sodium: 141 mmol/L (ref 134–144)
eGFR: 45 mL/min/{1.73_m2} — ABNORMAL LOW (ref >59)

## 2022-02-07 NOTE — Telephone Encounter (Addendum)
Prescription refill request for Xarelto received.  ?Indication: Afib  ?Last office visit: 12/01/21 Tamala Julian)  ?Weight: 87.7kg ?Age: 76 ?Scr: 1.57 (02/06/22) ?CrCl: 49.85m/min ? ?Confirmed with MFuller [place] PharmD. Pt should remain on '15mg'$  tablet. Called pt and made him aware that dosage will remain the same at this time. Appropriate dose and refill sent to requested pharmacy.  ? ? ?

## 2022-02-14 ENCOUNTER — Other Ambulatory Visit: Payer: Self-pay | Admitting: Interventional Cardiology

## 2022-02-22 ENCOUNTER — Other Ambulatory Visit: Payer: Self-pay | Admitting: Internal Medicine

## 2022-02-22 DIAGNOSIS — Z122 Encounter for screening for malignant neoplasm of respiratory organs: Secondary | ICD-10-CM

## 2022-03-03 ENCOUNTER — Encounter: Payer: Self-pay | Admitting: Interventional Cardiology

## 2022-03-04 ENCOUNTER — Other Ambulatory Visit: Payer: Self-pay | Admitting: Interventional Cardiology

## 2022-03-06 ENCOUNTER — Other Ambulatory Visit: Payer: Self-pay | Admitting: Pharmacist

## 2022-03-06 DIAGNOSIS — I4891 Unspecified atrial fibrillation: Secondary | ICD-10-CM

## 2022-03-06 NOTE — Telephone Encounter (Signed)
Age 76, weight 193 lbs, scr 1.57, crcl 50

## 2022-03-08 NOTE — Telephone Encounter (Signed)
This encounter was created in error - please disregard.

## 2022-03-08 NOTE — Telephone Encounter (Deleted)
Prescription refill request for Xarelto received.  Indication: Afib  Last office visit: 12/01/21 Walter [name])  Weight: 87.7kg Age: 76 Scr: 1.57 (02/06/22)  CrCl: 49.71m/min

## 2022-03-08 NOTE — Telephone Encounter (Signed)
Called pt and and confirmed his prescription for Xarelto was transferred to requested pharmacy. Pt stated he was able to get his prescription filled at correct pharmacy and appreciated the call to confirm.

## 2022-03-21 ENCOUNTER — Ambulatory Visit
Admission: RE | Admit: 2022-03-21 | Discharge: 2022-03-21 | Disposition: A | Discharge status: Home / Self Care | Payer: Medicare HMO | Source: Ambulatory Visit | Attending: Internal Medicine | Admitting: Internal Medicine

## 2022-03-21 DIAGNOSIS — Z122 Encounter for screening for malignant neoplasm of respiratory organs: Secondary | ICD-10-CM

## 2022-03-26 ENCOUNTER — Telehealth: Payer: Self-pay | Admitting: *Deleted

## 2022-03-26 NOTE — Telephone Encounter (Signed)
   Pre-operative Risk Assessment    Patient Name: Walter [name]  DOB: Jun 07, 1946 MRN: [id number]      Request for Surgical Clearance    Procedure:   COLONOSCOPY/ENDOSCOPY  Date of Surgery:  Clearance 05/02/22                                 Surgeon:  DR. Paulita Fujita Surgeon's Group or Practice Name:  [facility] Phone number:  [phone] Fax number:  [phone]   Type of Clearance Requested:   - Medical  - Pharmacy:  Hold Rivaroxaban (Xarelto)     Type of Anesthesia:   PROPOFOL   Additional requests/questions:    Jiles Prows   03/26/2022, 4:46 PM

## 2022-03-27 NOTE — Telephone Encounter (Signed)
Patient with diagnosis of A Fib on Xarelto for anticoagulation.    Procedure: colonoscopy Date of procedure: 05/02/22   CHA2DS2-VASc Score = 8  This indicates a 10.8% annual risk of stroke. The patient's score is based upon: CHF History: 1 HTN History: 1 Diabetes History: 1 Stroke History: 2 Vascular Disease History: 1 Age Score: 2 Gender Score: 0   CrCl 50 mL/min Platelet count 239K  Patient will be high risk off anticoagulation for any extended period of time.  Recommend patient hold Xarelto for 1 day prior to procedure.

## 2022-03-27 NOTE — Telephone Encounter (Signed)
    Name: Walter [name]  DOB: 31-Dec-1945  MRN: [id number]  Primary Cardiologist: Sinclair Grooms, MD   Preoperative team, please contact this patient and set up a phone call appointment for further preoperative risk assessment. Please obtain consent and complete medication review. Thank you for your help.  I confirm that guidance regarding antiplatelet and oral anticoagulation therapy has been completed and, if necessary, noted below.    Ledora Bottcher, PA 03/27/2022, 1:41 PM [place] [address] [place], [place] [address]

## 2022-03-27 NOTE — Telephone Encounter (Signed)
I called the pt and he answered the phone but then phone was disconnected. I called back and I got his vm. I left a message to call back to set up a tele pre op appt.

## 2022-03-28 NOTE — Telephone Encounter (Signed)
Call placed to pt regarding surgical clearance and need for a televisit, left a message for pt to call back and ask for preop team.

## 2022-03-30 ENCOUNTER — Telehealth: Payer: Self-pay | Admitting: *Deleted

## 2022-03-30 ENCOUNTER — Telehealth: Payer: Self-pay | Admitting: Primary Care

## 2022-03-30 NOTE — Telephone Encounter (Signed)
I s/w the pt and he is agreeable to plan of care for tele pre op appt 04/12/22 @ 11 am. Med rec and consent are done.

## 2022-04-04 ENCOUNTER — Other Ambulatory Visit: Payer: Self-pay | Admitting: Interventional Cardiology

## 2022-04-09 ENCOUNTER — Telehealth: Payer: Medicare Other

## 2022-04-09 ENCOUNTER — Encounter: Payer: Self-pay | Admitting: Physician Assistant

## 2022-04-09 ENCOUNTER — Telehealth: Payer: Self-pay | Admitting: Interventional Cardiology

## 2022-04-09 NOTE — Telephone Encounter (Signed)
I s/w the pt and he tells me surgery is on hold until he see's GI . Pt will call the office and let us know when procedure will be rescheduled for . Pt thanked me for the call .

## 2022-04-09 NOTE — Telephone Encounter (Signed)
Patient states he was returning call. Please advise ?

## 2022-05-04 ENCOUNTER — Ambulatory Visit: Payer: Medicare Other | Admitting: Physician Assistant

## 2022-05-07 ENCOUNTER — Telehealth: Payer: Self-pay | Admitting: Gastroenterology

## 2022-05-08 ENCOUNTER — Encounter: Payer: Self-pay | Admitting: Pulmonary Disease

## 2022-05-08 ENCOUNTER — Ambulatory Visit (INDEPENDENT_AMBULATORY_CARE_PROVIDER_SITE_OTHER): Payer: Medicare HMO | Admitting: Pulmonary Disease

## 2022-05-08 VITALS — BP 116/62 | HR 69 | Temp 97.6°F | Ht 67.0 in | Wt 169.0 lb

## 2022-05-08 DIAGNOSIS — J849 Interstitial pulmonary disease, unspecified: Secondary | ICD-10-CM | POA: Diagnosis not present
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
## 2022-05-08 NOTE — Patient Instructions (Signed)
I have reviewed your CT which shows mild changes of a condition called interstitial lung disease which is inflammation and scarring in the lung We will get some labs today, schedule high-resolution CT and lung function test for further evaluation Return to clinic after these tests for review and plan for next steps

## 2022-05-10 LAB — ANTI-DNA ANTIBODY, DOUBLE-STRANDED: ds DNA Ab: <1 IU/mL

## 2022-05-10 LAB — CYCLIC CITRUL PEPTIDE ANTIBODY, IGG: Cyclic Citrullin Peptide Ab: <16 UNITS

## 2022-05-10 LAB — RHEUMATOID FACTOR: Rheumatoid fact SerPl-aCnc: 89 IU/mL — ABNORMAL HIGH (ref <14)

## 2022-05-10 LAB — ANA: Anti Nuclear Antibody (ANA): NEGATIVE

## 2022-05-10 NOTE — Telephone Encounter (Signed)
Hi Dr. Bryan Lemma,  I have the patients paperwork will be sending them to you today for further review also FYI the patient has been scheduled for an office visit currently on for 05/30/22. Please let me know if you would like for me to cancel it out if you need more time to review.    Thank you

## 2022-05-10 NOTE — Telephone Encounter (Signed)
Good morning Dr. Bryan Lemma  We have received a referral for the following patient to be seen for severe constipation. Patient is requesting you to be his provider. We have received his records from [facility]. Please review and advise of scheduling.   Thank you.

## 2022-05-11 NOTE — Telephone Encounter (Signed)
Records received and reviewed and notable for the following:  -Colonoscopy (02/2009): 5 cm adenomatous polyp at hepatic flexure - Colonoscopy (07/2009): Piecemeal resection of adenomatous polyp with HGD at hepatic flexure - Colonoscopy (11/2009): Moderate residual polyp.  No HGD - Colonoscopy (07/2010): Small residual polyp - Colonoscopy (02/2011): No residual polyp, but HGD on biopsies - EGD (02/2011): Barrett's Esophagus without dysplasia - 05/2011: Right hemicolectomy - EGD (08/16/2014): 1 cm mucosal changes in distal esophagus (Path: Chronic inflammation suggestive of reflux, negative for intestinal metaplasia).  Normal stomach and duodenum.  Recommended repeat upper endoscopy in 5 years - Colonoscopy (08/16/2014): Normal colon.  Normal TI.  Recommended repeat colonoscopy in 5 years  PMHx: CAD 3, diabetes, depression, GERD, Barrett's Esophagus, history of SBO, diverticulosis with history of diverticulitis, paroxysmal A-fib, chronic anticoagulation, CAD, ILD, OSA, HLD, HTN, hypothyroidism, BPH, left frontal CVA, history of colon polyps requiring right hemicolectomy, PTSD, prostate CA s/p external beam and brachytherapy 2007 in [place]  After review of records, ok to keep OV with me as scheduled. Thanks.

## 2022-05-30 ENCOUNTER — Ambulatory Visit (INDEPENDENT_AMBULATORY_CARE_PROVIDER_SITE_OTHER): Payer: Medicare HMO | Admitting: Gastroenterology

## 2022-05-30 ENCOUNTER — Telehealth: Payer: Self-pay

## 2022-05-30 ENCOUNTER — Encounter: Payer: Self-pay | Admitting: Gastroenterology

## 2022-05-30 VITALS — BP 80/50 | HR 68 | Ht 65.0 in | Wt 163.4 lb

## 2022-05-30 DIAGNOSIS — R112 Nausea with vomiting, unspecified: Secondary | ICD-10-CM

## 2022-05-30 DIAGNOSIS — R634 Abnormal weight loss: Secondary | ICD-10-CM

## 2022-05-30 DIAGNOSIS — Z86010 Personal history of colonic polyps: Secondary | ICD-10-CM

## 2022-05-30 DIAGNOSIS — K5900 Constipation, unspecified: Secondary | ICD-10-CM | POA: Diagnosis not present

## 2022-05-30 DIAGNOSIS — I4821 Permanent atrial fibrillation: Secondary | ICD-10-CM

## 2022-05-30 DIAGNOSIS — K2270 Barrett's esophagus without dysplasia: Secondary | ICD-10-CM

## 2022-05-30 DIAGNOSIS — K219 Gastro-esophageal reflux disease without esophagitis: Secondary | ICD-10-CM

## 2022-05-30 DIAGNOSIS — Z7901 Long term (current) use of anticoagulants: Secondary | ICD-10-CM

## 2022-05-30 MED ORDER — NA SULFATE-K SULFATE-MG SULF 17.5-3.13-1.6 GM/177ML PO SOLN: 1.0000 | Freq: Once | ORAL | 0 refills | Status: AC

## 2022-05-30 NOTE — Patient Instructions (Addendum)
_______________________________________________________  If you are age 77 or older, your body mass index should be between 23-30. Your Body mass index is 27.19 kg/m. If this is out of the aforementioned range listed, please consider follow up with your Primary Care Provider.  ________________________________________________________  The [facility] providers would like to encourage you to use [facility] to communicate with providers for non-urgent requests or questions.  Due to long hold times on the telephone, sending your provider a message by [facility] may be a faster and more efficient way to get a response.  Please allow 48 business hours for a response.  Please remember that this is for non-urgent requests.  _______________________________________________________  Due to recent changes in healthcare laws, you may see the results of your imaging and laboratory studies on MyChart before your provider has had a chance to review them.  We understand that in some cases there may be results that are confusing or concerning to you. Not all laboratory results come back in the same time frame and the provider may be waiting for multiple results in order to interpret others.  Please give [place] 48 hours in order for your provider to thoroughly review all the results before contacting the office for clarification of your results.    You have been scheduled for an endoscopy and colonoscopy. Please follow the written instructions given to you at your visit today. Please pick up your prep supplies at the pharmacy within the next 1-3 days. If you use inhalers (even only as needed), please bring them with you on the day of your procedure.   We have sent the following medications to your pharmacy for you to pick up at your convenience: [facility] will be contaced by our office prior to your procedure for directions on holding your Xarelto.  If you do not hear from our office 1 week prior to your scheduled procedure,  please call [phone] to discuss.   Thank you for choosing me and [facility].  Vito Cirigliano, D.O.

## 2022-05-30 NOTE — Telephone Encounter (Signed)
[facility] Pre-operative Risk Assessment     Request for surgical clearance:     Endoscopy Procedure  What type of surgery is being performed?     Colonoscopy and Endoscopy  When is this surgery scheduled?     06/15/22  What type of clearance is required ?   Pharmacy  Are there any medications that need to be held prior to surgery and how long? Xarelto for 2 days  Practice name and name of physician performing surgery?  [facility], Dr. Bryan Lemma  What is your office phone and fax number?      Phone- [phone]  [phone]  Anesthesia type (None, local, MAC, general) ?       MAC
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
## 2022-05-30 NOTE — Telephone Encounter (Signed)
Previously cleared for colonoscopy on 03/26/22 encounter, looks like procedure was postponed. Recommend same rec for this procedure to hold Xarelto for 1 day prior to procedure due to elevated CV risk, and resume as soon as safely possible after.

## 2022-05-31 NOTE — Telephone Encounter (Signed)
Patient made aware to hold xarelto 1 day prior to the procedure which is on 06/14/22.

## 2022-05-31 NOTE — Telephone Encounter (Signed)
   Patient Name: Walter [name]  DOB: 10/16/1945 MRN: [id number]  Primary Cardiologist: Sinclair Grooms, MD  Chart reviewed as part of pre-operative protocol coverage. Pre-op clearance already addressed by colleagues in earlier phone notes. To summarize recommendations:  -Okay to hold Xarelto 1 day prior to the procedure.  Patient has already been called to inform.  Will route this bundled recommendation to requesting provider via Epic fax function and remove from pre-op pool. Please call with questions.  Elgie Collard, PA-C 05/31/2022, 11:07 AM

## 2022-05-31 NOTE — Telephone Encounter (Signed)
LVM to the patient to give [place] a call back to inform him to hold Xarelto 1 day prior to the procedure.

## 2022-06-01 ENCOUNTER — Ambulatory Visit (INDEPENDENT_AMBULATORY_CARE_PROVIDER_SITE_OTHER): Payer: Medicare HMO | Admitting: Pulmonary Disease

## 2022-06-01 DIAGNOSIS — J849 Interstitial pulmonary disease, unspecified: Secondary | ICD-10-CM

## 2022-06-01 LAB — PULMONARY FUNCTION TEST
DL/VA % pred: 77 %
DL/VA: 3.1 ml/min/mmHg/L
DLCO cor % pred: 63 %
DLCO cor: 13.87 ml/min/mmHg
DLCO unc % pred: 63 %
DLCO unc: 13.87 ml/min/mmHg
FEF 25-75 Post: 2.64 L/sec
FEF 25-75 Pre: 2.46 L/sec
FEF2575-%Change-Post: 7 %
FEF2575-%Pred-Post: 145 %
FEF2575-%Pred-Pre: 135 %
FEV1-%Change-Post: 0 %
FEV1-%Pred-Post: 112 %
FEV1-%Pred-Pre: 112 %
FEV1-Post: 2.85 L
FEV1-Pre: 2.84 L
FEV1FVC-%Change-Post: -3 %
FEV1FVC-%Pred-Pre: 107 %
FEV6-%Change-Post: 2 %
FEV6-%Pred-Post: 113 %
FEV6-%Pred-Pre: 110 %
FEV6-Post: 3.74 L
FEV6-Pre: 3.65 L
FEV6FVC-%Pred-Post: 107 %
FEV6FVC-%Pred-Pre: 107 %
FVC-%Change-Post: 4 %
FVC-%Pred-Post: 107 %
FVC-%Pred-Pre: 103 %
FVC-Post: 3.81 L
FVC-Pre: 3.65 L
Post FEV1/FVC ratio: 75 %
Post FEV6/FVC ratio: 100 %
Pre FEV1/FVC ratio: 78 %
Pre FEV6/FVC Ratio: 100 %
RV % pred: 120 %
RV: 2.84 L
TLC % pred: 101 %
TLC: 6.32 L

## 2022-06-01 NOTE — Progress Notes (Signed)
Full PFT Performed Today  

## 2022-06-01 NOTE — Patient Instructions (Signed)
Full PFT Performed Today  

## 2022-06-04 ENCOUNTER — Ambulatory Visit (INDEPENDENT_AMBULATORY_CARE_PROVIDER_SITE_OTHER): Payer: Medicare HMO | Admitting: Pulmonary Disease

## 2022-06-04 ENCOUNTER — Encounter: Payer: Self-pay | Admitting: Pulmonary Disease

## 2022-06-04 VITALS — BP 118/56 | HR 71 | Temp 97.6°F | Ht 66.0 in | Wt 161.6 lb

## 2022-06-04 DIAGNOSIS — J849 Interstitial pulmonary disease, unspecified: Secondary | ICD-10-CM

## 2022-06-04 NOTE — Patient Instructions (Signed)
I am glad you are stable with your breathing Your lung function test shows mild reduction in lung capacity due to a combination of emphysema and interstitial lung disease Per our discussion today we will manage this conservatively and monitor I will review of CT when available Follow-up in clinic in 6 months

## 2022-06-04 NOTE — Progress Notes (Signed)
Walter [name]    [id number]    12/15/1945  Primary Care Physician:Griffin, Jenny Reichmann, MD  Referring Physician: Lavone Orn, MD [facility] [address] [employer] [address],  [place] [id number]  Chief complaint: Follow-up for interstitial lung disease  HPI: 76 y.o. who  has a past medical history of Abdominal pain, Atrial fibrillation ([place]), Barrett's esophagus, CAD (coronary artery disease), Cancer ([place]), CHF (congestive heart failure) ([place]), Chronic kidney disease, Colon polyp, CVA (cerebral vascular accident) ([place]) (12/2014), Depression, Diabetes mellitus, Gum disease, Hiatal hernia, HLD (hyperlipidemia), Hypertension, Kidney stone, Numbness, OSA on CPAP, Paresthesia of bilateral legs, Pleural effusion, Pneumonia, Prostate cancer ([place]) (2007), PTSD (post-traumatic stress disorder), Stroke ([place]), and Vertigo.   Referred for evaluation of interstitial lung disease noted on recent screening CT of the chest.  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  He made it out of the building but had significant exposure to inhaled toxins after collapse of the towers.  No ongoing exposures.  No mold, hot tub, Jacuzzi.  No feather pillows or comforters Smoking history: 40-pack-year smoker.  Quit in 2008 Travel history: Previously lived in [place] and [place].  Moved to [place] in 2008.  No significant recent travel Relevant family history: No family history of lung disease.  Interim history: Breathing is doing well.  Here for review of PFTs and labs.  Outpatient Encounter Medications as of 06/04/2022  Medication Sig   amiodarone (PACERONE) 200 MG tablet Take 1 tablet (200 mg total) by mouth daily.   buPROPion (WELLBUTRIN XL)  150 MG 24 hr tablet Take 150 mg by mouth daily.   calcium carbonate (TUMS - DOSED IN MG ELEMENTAL CALCIUM) 500 MG chewable tablet Chew 1 tablet by mouth daily as needed for indigestion or heartburn.   FARXIGA 5 MG TABS tablet Take 5 mg by mouth daily.   fesoterodine (TOVIAZ) 8 MG TB24 tablet Take 8 mg by mouth daily.   levothyroxine (SYNTHROID) 88 MCG tablet Take 88 mcg by mouth every morning.   lisinopril (ZESTRIL) 20 MG tablet Take 1 tablet by mouth daily.   metoprolol succinate (TOPROL-XL) 25 MG 24 hr tablet TAKE 1 TABLET (25 MG TOTAL) BY MOUTH DAILY.   Omega-3-acid Ethyl Esters (LOVAZA PO) Take 1 tablet by mouth 2 (two) times daily.   Rivaroxaban (XARELTO) 15 MG TABS tablet Take 1 tablet (15 mg total) by mouth daily with supper.   rosuvastatin (CRESTOR) 10 MG tablet Take 10 mg by mouth at bedtime.   tamsulosin (FLOMAX) 0.4 MG CAPS capsule Take 0.8 mg by mouth every other day.   TRULICITY 1.5 HQ/7.5FF SOPN Inject 1.5 mg into the skin once a week.   No facility-administered encounter medications on file as of 06/04/2022.   Physical Exam: Blood pressure (!) 118/56, pulse 71, temperature 97.6 F (36.4 C), temperature source Oral, height '5\' 6"'$  (1.676 m), weight 161 lb 9.6 oz (73.3 kg), SpO2 99 %. Gen:      No acute distress HEENT:  EOMI, sclera anicteric Neck:     No masses; no thyromegaly Lungs:    Clear to auscultation bilaterally; normal respiratory effort CV:         Regular rate and rhythm;  no murmurs Abd:      + bowel sounds; soft, non-tender; no palpable masses, no distension Ext:    No edema; adequate peripheral perfusion Skin:      Warm and dry; no rash Neuro: alert and oriented x 3 Psych: normal mood and affect   Data Reviewed: Imaging: Lung cancer screening CT 03/21/2022-mild bronchial wall thickening with emphysema mild groundglass attenuation with subpleural reticulation, bronchiectasis at the lung base bilaterally.  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  He does have significant exposure from [facility] collapse on 9/11 and history of hemothorax on the left the changes may be related to that.  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  Rheumatoid factor is elevated but he does not have joint symptoms.  He is also on amiodarone but CT scan is not typical  High-resolution CT is pending Discussed further work-up, possible etiology in detail with him in clinic.  He is interested in conservative management and does not want any aggressive work-up or treatment at present  Plan/Recommendations: Follow High res CT  Marshell Garfinkel MD [facility] 06/04/2022, 10:21 AM  CC: Lavone Orn, MD

## 2022-06-08 ENCOUNTER — Encounter: Payer: Self-pay | Admitting: Gastroenterology

## 2022-06-08 NOTE — Telephone Encounter (Signed)
Since procedure is on 9/8, I recommend holding dose on 9/7, and we can potentially restart the Xarelto immediately after the procedure, depending on what we find.  That should only be a 1 day hold.  I agree with not having a prolonged 2-day hold given his history.

## 2022-06-08 NOTE — Telephone Encounter (Signed)
Left message for pt to call back  °

## 2022-06-10 ENCOUNTER — Other Ambulatory Visit: Payer: Self-pay | Admitting: Interventional Cardiology

## 2022-06-12 ENCOUNTER — Other Ambulatory Visit: Payer: Self-pay

## 2022-06-12 DIAGNOSIS — Z85828 Personal history of other malignant neoplasm of skin: Secondary | ICD-10-CM | POA: Insufficient documentation

## 2022-06-12 DIAGNOSIS — L821 Other seborrheic keratosis: Secondary | ICD-10-CM | POA: Insufficient documentation

## 2022-06-12 DIAGNOSIS — L814 Other melanin hyperpigmentation: Secondary | ICD-10-CM | POA: Insufficient documentation

## 2022-06-12 DIAGNOSIS — D1801 Hemangioma of skin and subcutaneous tissue: Secondary | ICD-10-CM | POA: Insufficient documentation

## 2022-06-14 ENCOUNTER — Telehealth: Payer: Self-pay

## 2022-06-14 ENCOUNTER — Ambulatory Visit (HOSPITAL_BASED_OUTPATIENT_CLINIC_OR_DEPARTMENT_OTHER): Payer: Medicare HMO

## 2022-06-14 NOTE — Telephone Encounter (Signed)
Agree with recommendations as outlined.  Can call on call this evening if symptoms worsen (I am on-call this evening).

## 2022-06-14 NOTE — Telephone Encounter (Signed)
Spoke w/ pt.  Pt stated symptoms of a headache, dizziness, runny nose and sneezing started about 30 minutes ago.  Pt stated that he checked his blood sugar and it was 130.  Pt was unable to check his BP since he doesn't have a cuff at home.  Pt denies having any other symptoms.  Advised pt to continue to drink plenty of fluids and to call after hours number if symptoms worsen.  Also advised pt to call office tomorrow morning if he is not feeling well.  Spoke w/ Dr.Cirigiliano directly and will forward note to him for review.

## 2022-06-15 ENCOUNTER — Encounter: Payer: Self-pay | Admitting: Gastroenterology

## 2022-06-15 ENCOUNTER — Ambulatory Visit (AMBULATORY_SURGERY_CENTER): Payer: Medicare HMO | Admitting: Gastroenterology

## 2022-06-15 VITALS — BP 106/56 | HR 61 | Temp 97.5°F | Resp 11 | Ht 65.0 in | Wt 163.0 lb

## 2022-06-15 DIAGNOSIS — K621 Rectal polyp: Secondary | ICD-10-CM | POA: Diagnosis not present

## 2022-06-15 DIAGNOSIS — K641 Second degree hemorrhoids: Secondary | ICD-10-CM

## 2022-06-15 DIAGNOSIS — D125 Benign neoplasm of sigmoid colon: Secondary | ICD-10-CM

## 2022-06-15 DIAGNOSIS — R112 Nausea with vomiting, unspecified: Secondary | ICD-10-CM | POA: Diagnosis not present

## 2022-06-15 DIAGNOSIS — K5900 Constipation, unspecified: Secondary | ICD-10-CM

## 2022-06-15 DIAGNOSIS — Z7901 Long term (current) use of anticoagulants: Secondary | ICD-10-CM

## 2022-06-15 DIAGNOSIS — K2950 Unspecified chronic gastritis without bleeding: Secondary | ICD-10-CM

## 2022-06-15 DIAGNOSIS — R634 Abnormal weight loss: Secondary | ICD-10-CM | POA: Diagnosis not present

## 2022-06-15 DIAGNOSIS — D123 Benign neoplasm of transverse colon: Secondary | ICD-10-CM

## 2022-06-15 DIAGNOSIS — K219 Gastro-esophageal reflux disease without esophagitis: Secondary | ICD-10-CM

## 2022-06-15 DIAGNOSIS — Z86010 Personal history of colonic polyps: Secondary | ICD-10-CM

## 2022-06-15 DIAGNOSIS — K2970 Gastritis, unspecified, without bleeding: Secondary | ICD-10-CM | POA: Diagnosis not present

## 2022-06-15 MED ORDER — SODIUM CHLORIDE 0.9 % IV SOLN: 500.0000 mL | INTRAVENOUS | Status: DC

## 2022-06-15 NOTE — Patient Instructions (Signed)
Await pathology results.  Resume blood thinner today, per Dr. Bryan Lemma.  YOU HAD AN ENDOSCOPIC PROCEDURE TODAY AT [facility]:   Refer to the procedure report that was given to you for any specific questions about what was found during the examination.  If the procedure report does not answer your questions, please call your gastroenterologist to clarify.  If you requested that your care partner not be given the details of your procedure findings, then the procedure report has been included in a sealed envelope for you to review at your convenience later.  YOU SHOULD EXPECT: Some feelings of bloating in the abdomen. Passage of more gas than usual.  Walking can help get rid of the air that was put into your GI tract during the procedure and reduce the bloating. If you had a lower endoscopy (such as a colonoscopy or flexible sigmoidoscopy) you may notice spotting of blood in your stool or on the toilet paper. If you underwent a bowel prep for your procedure, you may not have a normal bowel movement for a few days.  Please Note:  You might notice some irritation and congestion in your nose or some drainage.  This is from the oxygen used during your procedure.  There is no need for concern and it should clear up in a day or so.  SYMPTOMS TO REPORT IMMEDIATELY:  Following lower endoscopy (colonoscopy or flexible sigmoidoscopy):  Excessive amounts of blood in the stool  Significant tenderness or worsening of abdominal pains  Swelling of the abdomen that is new, acute  Fever of 100F or higher  Following upper endoscopy (EGD)  Vomiting of blood or coffee ground material  New chest pain or pain under the shoulder blades  Painful or persistently difficult swallowing  New shortness of breath  Fever of 100F or higher  Black, tarry-looking stools  For urgent or emergent issues, a gastroenterologist can be reached at any hour by calling [phone]. Do not use MyChart messaging  for urgent concerns.    DIET:  We do recommend a small meal at first, but then you may proceed to your regular diet.  Drink plenty of fluids but you should avoid alcoholic beverages for 24 hours.  ACTIVITY:  You should plan to take it easy for the rest of today and you should NOT DRIVE or use heavy machinery until tomorrow (because of the sedation medicines used during the test).    FOLLOW UP: Our staff will call the number listed on your records the next business day following your procedure.  We will call around 7:15- 8:00 am to check on you and address any questions or concerns that you may have regarding the information given to you following your procedure. If we do not reach you, we will leave a message.  If you develop any symptoms (ie: fever, flu-like symptoms, shortness of breath, cough etc.) before then, please call [phone].  If you test positive for Covid 19 in the 2 weeks post procedure, please call and report this information to [place].    If any biopsies were taken you will be contacted by phone or by letter within the next 1-3 weeks.  Please call us at [phone] if you have not heard about the biopsies in 3 weeks.    SIGNATURES/CONFIDENTIALITY: You and/or your care partner have signed paperwork which will be entered into your electronic medical record.  These signatures attest to the fact that that the information above on your After Visit Summary  has been reviewed and is understood.  Full responsibility of the confidentiality of this discharge information lies with you and/or your care-partner.  

## 2022-06-15 NOTE — Progress Notes (Signed)
GASTROENTEROLOGY PROCEDURE H&P NOTE   Primary Care Physician: Lavone Orn, MD    Reason for Procedure:   GERD, Barrett's Esophagus, nausea/vomiting, wt loss, change in bowel habits, constipation, abdominal pain  Plan:    EGD/Colonoscopy  Patient is appropriate for endoscopic procedure(s) in the ambulatory ([facility]) setting.  The nature of the procedure, as well as the risks, benefits, and alternatives were carefully and thoroughly reviewed with the patient. Ample time for discussion and questions allowed. The patient understood, was satisfied, and agreed to proceed.     HPI: Walter [name] is a 76 y.o. male who presents for EGD and colonoscopy today.  Patient was most recently seen in the Gastroenterology Clinic on 05/30/22.  No interval change in medical history since that appointment. Please refer to that note for full details regarding GI history and clinical presentation.   Endoscopic History: -Colonoscopy (02/2009): 5 cm adenomatous polyp at hepatic flexure - Colonoscopy (07/2009): Piecemeal resection of adenomatous polyp with HGD at hepatic flexure - Colonoscopy (11/2009): Moderate residual polyp.  No HGD - Colonoscopy (07/2010): Small residual polyp - Colonoscopy (02/2011): No residual polyp, but HGD on biopsies - EGD (02/2011): Barrett's Esophagus without dysplasia - 05/2011: Right hemicolectomy - EGD (08/16/2014): 1 cm mucosal changes in distal esophagus (Path: Chronic inflammation suggestive of reflux, negative for intestinal metaplasia).  Normal stomach and duodenum.  Recommended repeat upper endoscopy in 5 years - Colonoscopy (08/16/2014): Normal colon.  Normal TI.  Recommended repeat colonoscopy in 5 years  Past Medical History:  Diagnosis Date   Abdominal pain    Atrial fibrillation ([place])    Barrett's esophagus    CAD (coronary artery disease)    Cancer ([facility])    prostate   CHF (congestive heart failure) ([facility])    Chronic kidney disease    Colon polyp    CVA (cerebral  vascular accident) ([place]) 12/2014   Depression    Diabetes mellitus    w/neuropathy   Gum disease    Hiatal hernia    HLD (hyperlipidemia)    Hypertension    Kidney stone    hx of   Numbness    hands, feet   OSA on CPAP    Paresthesia of bilateral legs    Pleural effusion    Pneumonia    Prostate cancer ([place]) 2007   radiation tx [place]   PTSD (post-traumatic stress disorder)    Stroke [facility])    Vertigo     Past Surgical History:  Procedure Laterality Date   CATARACT EXTRACTION Bilateral 2006 and 2007   bilateral   COLON SURGERY  05/2011   hemicolectomy   COLONOSCOPY     CORONARY ARTERY BYPASS GRAFT  2004   x3    Prior to Admission medications   Medication Sig Start Date End Date Taking? Authorizing Provider  amiodarone (PACERONE) 200 MG tablet TAKE 1 TABLET BY MOUTH EVERY DAY 06/12/22  Yes Belva Crome, MD  buPROPion (WELLBUTRIN XL) 150 MG 24 hr tablet Take 150 mg by mouth daily.   Yes [provider]  FARXIGA 5 MG TABS tablet Take 5 mg by mouth daily. 05/07/22  Yes [provider]  fesoterodine (TOVIAZ) 8 MG TB24 tablet Take 8 mg by mouth daily. 05/24/21  Yes [provider]  levothyroxine (SYNTHROID) 88 MCG tablet Take 88 mcg by mouth every morning. 10/30/21  Yes [provider]  metoprolol succinate (TOPROL-XL) 25 MG 24 hr tablet TAKE 1 TABLET (25 MG TOTAL) BY MOUTH DAILY. 04/05/22  Yes  Belva Crome, MD  Omega-3-acid Ethyl Esters (LOVAZA PO) Take 1 tablet by mouth 2 (two) times daily.   Yes [provider]  rosuvastatin (CRESTOR) 10 MG tablet Take 10 mg by mouth at bedtime.   Yes [provider]  tamsulosin (FLOMAX) 0.4 MG CAPS capsule Take 0.8 mg by mouth every other day.   Yes [provider]  TRULICITY 1.5 HT/3.4KA SOPN Inject 1.5 mg into the skin once a week. 06/19/21  Yes [provider]  calcium carbonate (TUMS - DOSED IN MG ELEMENTAL CALCIUM) 500 MG chewable tablet Chew 1 tablet by mouth daily as  needed for indigestion or heartburn.    [provider]  Rivaroxaban (XARELTO) 15 MG TABS tablet Take 1 tablet (15 mg total) by mouth daily with supper. 03/06/22   Belva Crome, MD    Current Outpatient Medications  Medication Sig Dispense Refill   amiodarone (PACERONE) 200 MG tablet TAKE 1 TABLET BY MOUTH EVERY DAY 90 tablet 1   buPROPion (WELLBUTRIN XL) 150 MG 24 hr tablet Take 150 mg by mouth daily.     FARXIGA 5 MG TABS tablet Take 5 mg by mouth daily.     fesoterodine (TOVIAZ) 8 MG TB24 tablet Take 8 mg by mouth daily.     levothyroxine (SYNTHROID) 88 MCG tablet Take 88 mcg by mouth every morning.     metoprolol succinate (TOPROL-XL) 25 MG 24 hr tablet TAKE 1 TABLET (25 MG TOTAL) BY MOUTH DAILY. 90 tablet 1   Omega-3-acid Ethyl Esters (LOVAZA PO) Take 1 tablet by mouth 2 (two) times daily.     rosuvastatin (CRESTOR) 10 MG tablet Take 10 mg by mouth at bedtime.     tamsulosin (FLOMAX) 0.4 MG CAPS capsule Take 0.8 mg by mouth every other day.     TRULICITY 1.5 JG/8.1LX SOPN Inject 1.5 mg into the skin once a week.     calcium carbonate (TUMS - DOSED IN MG ELEMENTAL CALCIUM) 500 MG chewable tablet Chew 1 tablet by mouth daily as needed for indigestion or heartburn.     Rivaroxaban (XARELTO) 15 MG TABS tablet Take 1 tablet (15 mg total) by mouth daily with supper. 90 tablet 1   Current Facility-Administered Medications  Medication Dose Route Frequency Provider Last Rate Last Admin   0.9 %  sodium chloride infusion  500 mL Intravenous Continuous Azreal Stthomas V, DO        Allergies as of 06/15/2022 - Review Complete 06/15/2022  Allergen Reaction Noted   Aspirin Other (See Comments) 11/15/2020   Byetta 10 mcg pen [exenatide] Nausea Only 09/20/2020   Invokana [canagliflozin] Other (See Comments) 09/20/2020   Levemir [insulin detemir] Other (See Comments) 09/20/2020   Nsaids  09/20/2020   Short ragweed pollen ext Cough 12/24/2019    Family History  Problem Relation Age of  Onset   Angina Mother    Hypertension Mother    Heart disease Mother    Diabetes Father    Coronary artery disease Father    Stroke Father    Hyperlipidemia Sister    Neuropathy Sister    Colon cancer Neg Hx     Social History   Socioeconomic History   Marital status: Married    Spouse name: Public affairs consultant   Number of children: 2   Years of education: BFA   Highest education level: Not on file  Occupational History   Occupation: Retired     Comment: retired Engineer, technical sales  Tobacco Use   Smoking status:  Former    Types: Cigarettes    Quit date: 2013    Years since quitting: 10.6   Smokeless tobacco: Never  Vaping Use   Vaping Use: Some days   Devices: once a week  Substance and Sexual Activity   Alcohol use: Yes    Alcohol/week: 3.0 standard drinks of alcohol    Types: 1 Glasses of wine, 1 Cans of beer, 1 Shots of liquor per week    Comment: social   Drug use: Not Currently    Types: Marijuana    Comment: frequently--THC   Sexual activity: Not on file  Other Topics Concern   Not on file  Social History Narrative   Left handed    Lives with wife   Caffeine -2 cups per day   Social Determinants of Health   Financial Resource Strain: Not on file  Food Insecurity: Not on file  Transportation Needs: Not on file  Physical Activity: Not on file  Stress: Not on file  Social Connections: Not on file  Intimate Partner Violence: Not on file    Physical Exam: Vital signs in last 24 hours: '@BP'$  132/75   Pulse 68   Temp (!) 97.5 F (36.4 C)   Ht '5\' 5"'$  (1.651 m)   Wt 163 lb (73.9 kg)   SpO2 100%   BMI 27.12 kg/m  GEN: NAD EYE: Sclerae anicteric ENT: MMM CV: Non-tachycardic Pulm: CTA b/l GI: Soft, NT/ND NEURO:  Alert & Oriented x 3   Gerrit Heck, DO [facility]   06/15/2022 1:25 PM

## 2022-06-15 NOTE — Op Note (Signed)
[facility] Patient Name: Walter [name] Procedure Date: 06/15/2022 1:19 PM MRN: [id number] Endoscopist: Gerrit Heck , MD Age: 76 Referring MD:  Date of Birth: May 18, 1946 Gender: Male Account #: [id number] Procedure:                Colonoscopy Indications:              High risk colon cancer surveillance: Personal                            history of colonic polyps, Incidental - Change in                            bowel habits, Incidental - Constipation                           Endoscopic History:                           -Colonoscopy (02/2009): 5 cm adenomatous polyp at                            hepatic flexure                           - Colonoscopy (07/2009): Piecemeal resection of                            adenomatous polyp with HGD at hepatic flexure                           - Colonoscopy (11/2009): Moderate residual polyp. No                            HGD                           - Colonoscopy (07/2010): Small residual polyp                           - Colonoscopy (02/2011): No residual polyp, but HGD                            on biopsies                           - 05/2011: Right hemicolectomy                           - Colonoscopy (08/16/2014): Normal colon. Normal TI.                            Recommended repeat colonoscopy in 5 years Medicines:                Monitored Anesthesia Care Procedure:                Pre-Anesthesia Assessment:                           -  Prior to the procedure, a History and Physical                            was performed, and patient medications and                            allergies were reviewed. The patient's tolerance of                            previous anesthesia was also reviewed. The risks                            and benefits of the procedure and the sedation                            options and risks were discussed with the patient.                            All questions were answered, and informed consent                             was obtained. Prior Anticoagulants: The patient has                            taken Xarelto (rivaroxaban), last dose was 2 days                            prior to procedure. ASA Grade Assessment: III - A                            patient with severe systemic disease. After                            reviewing the risks and benefits, the patient was                            deemed in satisfactory condition to undergo the                            procedure.                           After obtaining informed consent, the colonoscope                            was passed under direct vision. Throughout the                            procedure, the patient's blood pressure, pulse, and                            oxygen saturations were monitored continuously. The  CF [id number] #[id number] was introduced through the anus                            and advanced to the the ileocolonic anastomosis.                            The colonoscopy was performed without difficulty.                            The patient tolerated the procedure well. The                            quality of the bowel preparation was good. The                            rectum, ileocolonic anastamosis, and neo-terminal                            ileum were photographed. Scope In: 1:42:33 PM Scope Out: 2:02:10 PM Scope Withdrawal Time: 0 hours 16 minutes 17 seconds  Total Procedure Duration: 0 hours 19 minutes 37 seconds  Findings:                 Hemorrhoids were found on perianal exam.                           There was evidence of a prior end-to-side                            ileo-colonic anastomosis in the transverse colon.                            This was patent and was characterized by healthy                            appearing mucosa. The anastomosis was traversed.                           Four sessile polyps were found in the transverse                             colon. The polyps were 3 to 6 mm in size. These                            polyps were removed with a cold snare. Resection                            and retrieval were complete. Estimated blood loss                            was minimal.                           Two sessile polyps were found in the sigmoid colon.  The polyps were 3 to 4 mm in size. These polyps                            were removed with a cold snare. Resection and                            retrieval were complete. Estimated blood loss was                            minimal.                           Non-bleeding internal hemorrhoids were found during                            retroflexion. The hemorrhoids were medium-sized and                            Grade II (internal hemorrhoids that prolapse but                            reduce spontaneously).                           The neo-terminal ileum appeared normal. Complications:            No immediate complications. Estimated Blood Loss:     Estimated blood loss was minimal. Impression:               - Hemorrhoids found on perianal exam.                           - Patent end-to-side ileo-colonic anastomosis,                            characterized by healthy appearing mucosa.                           - Four 3 to 6 mm polyps in the transverse colon,                            removed with a cold snare. Resected and retrieved.                           - Two 3 to 4 mm polyps in the sigmoid colon,                            removed with a cold snare. Resected and retrieved.                           - Non-bleeding internal hemorrhoids.                           - The examined portion of the ileum was normal. Recommendation:           - Patient  has a contact number available for                            emergencies. The signs and symptoms of potential                            delayed complications were discussed with the                             patient. Return to normal activities tomorrow.                            Written discharge instructions were provided to the                            patient.                           - Resume previous diet.                           - Continue present medications.                           - Await pathology results.                           - Repeat colonoscopy for surveillance based on                            pathology results.                           - Return to GI clinic PRN.                           - Resume Xarelto tomorrow. Gerrit Heck, MD 06/15/2022 2:22:39 PM

## 2022-06-15 NOTE — Progress Notes (Signed)
To pacu, VSS. Report to Rn.tb 

## 2022-06-15 NOTE — Op Note (Signed)
[facility] Patient Name: Walter [name] Procedure Date: 06/15/2022 1:20 PM MRN: [id number] Endoscopist: Gerrit Heck , MD Age: 76 Referring MD:  Date of Birth: [birth year] Gender: Male Account #: [id number] Procedure:                Upper GI endoscopy Indications:              Generalized abdominal pain, Esophageal reflux,                            Nausea with vomiting, Weight loss Medicines:                Monitored Anesthesia Care Procedure:                Pre-Anesthesia Assessment:                           - Prior to the procedure, a History and Physical                            was performed, and patient medications and                            allergies were reviewed. The patient's tolerance of                            previous anesthesia was also reviewed. The risks                            and benefits of the procedure and the sedation                            options and risks were discussed with the patient.                            All questions were answered, and informed consent                            was obtained. Prior Anticoagulants: The patient has                            taken Xarelto (rivaroxaban), last dose was 2 days                            prior to procedure. ASA Grade Assessment: III - A                            patient with severe systemic disease. After                            reviewing the risks and benefits, the patient was                            deemed in satisfactory condition to undergo the  procedure.                           After obtaining informed consent, the endoscope was                            passed under direct vision. Throughout the                            procedure, the patient's blood pressure, pulse, and                            oxygen saturations were monitored continuously. The                            Endoscope was introduced through the mouth, and                             advanced to the second part of duodenum. The upper                            GI endoscopy was accomplished without difficulty.                            The patient tolerated the procedure well. Scope In: Scope Out: Findings:                 The examined esophagus was normal.                           Diffuse moderate inflammation characterized by                            congestion (edema) and erythema was found in the                            entire examined stomach. Biopsies were taken with a                            cold forceps for histology. Estimated blood loss                            was minimal.                           The examined duodenum was normal. Complications:            No immediate complications. Estimated Blood Loss:     Estimated blood loss was minimal. Impression:               - Normal esophagus.                           - Gastritis. Biopsied.                           - Normal examined duodenum. Recommendation:           -  Patient has a contact number available for                            emergencies. The signs and symptoms of potential                            delayed complications were discussed with the                            patient. Return to normal activities tomorrow.                            Written discharge instructions were provided to the                            patient.                           - Resume previous diet.                           - Continue present medications.                           - Await pathology results.                           - Perform a colonoscopy today. Gerrit Heck, MD 06/15/2022 2:17:20 PM

## 2022-06-15 NOTE — Progress Notes (Signed)
IV cath removed from [facility], when RN removed adhesive tape which was over the catheter, 3 skin tears occurred.  Skin tears approximately 0.25cm X2 and 0.50 X 1.  Gauze and paper tape applied.  Dr. Bryan Lemma was informed. SChaplin, RN,BSN

## 2022-06-15 NOTE — Progress Notes (Signed)
Called to room to assist during endoscopic procedure.  Patient ID and intended procedure confirmed with present staff. Received instructions for my participation in the procedure from the performing physician.  

## 2022-06-15 NOTE — Progress Notes (Signed)
Pt's states no medical or surgical changes since previsit or office visit. 

## 2022-06-18 ENCOUNTER — Telehealth: Payer: Self-pay | Admitting: *Deleted

## 2022-06-18 NOTE — Telephone Encounter (Signed)
  Follow up Call-     06/15/2022    1:02 PM  Call back number  Post procedure Call Back phone  # [phone]  Permission to leave phone message Yes     Patient questions:  Do you have a fever, pain , or abdominal swelling? No. Pain Score  0 *  Have you tolerated food without any problems? Yes.    Have you been able to return to your normal activities? Yes.    Do you have any questions about your discharge instructions: Diet   No. Medications  No. Follow up visit  No.  Do you have questions or concerns about your Care? No.  Actions: * If pain score is 4 or above: No action needed, pain <4.

## 2022-06-19 ENCOUNTER — Encounter: Payer: Self-pay | Admitting: Gastroenterology

## 2022-06-19 ENCOUNTER — Telehealth: Payer: Self-pay

## 2022-06-19 NOTE — Telephone Encounter (Signed)
error 

## 2022-06-19 NOTE — Telephone Encounter (Signed)
Pt reports that today he started having nausea without vomiting, diarrhea, dizziness, and his "nose has been running like a faucet." Pt denies fever. Pt wanted to know if he should go to ED. Advised pt that he could take a covid test to see if that could be it and contact PCP or go to urgent care.

## 2022-06-20 ENCOUNTER — Encounter: Payer: Self-pay | Admitting: Gastroenterology

## 2022-06-21 NOTE — Telephone Encounter (Signed)
Path not yet back.  Based on the sudden onset and improving symptoms now, agree this is most likely a viral gastroenteritis.  Maintain supportive care with adequate hydration and advance diet as tolerated.  If symptoms persist, could do further work-up like GI PCR panel, labs, etc.

## 2022-06-22 ENCOUNTER — Ambulatory Visit (HOSPITAL_BASED_OUTPATIENT_CLINIC_OR_DEPARTMENT_OTHER)
Admission: RE | Admit: 2022-06-22 | Discharge: 2022-06-22 | Disposition: A | Discharge status: Home / Self Care | Payer: Medicare HMO | Source: Ambulatory Visit | Attending: Pulmonary Disease | Admitting: Pulmonary Disease

## 2022-06-22 DIAGNOSIS — J849 Interstitial pulmonary disease, unspecified: Secondary | ICD-10-CM | POA: Diagnosis present

## 2022-06-25 ENCOUNTER — Encounter: Payer: Self-pay | Admitting: Gastroenterology

## 2022-07-02 ENCOUNTER — Encounter: Payer: Self-pay | Admitting: Gastroenterology

## 2022-07-02 ENCOUNTER — Other Ambulatory Visit: Payer: Self-pay

## 2022-07-02 NOTE — Telephone Encounter (Signed)
Trial Linzess 72 mcg/daily.  Starting with low-dose, and can titrate up from there as needed.  Please caution patient of diarrhea, abdominal pain/cramping within the first 7-14 days of starting medication.  Hold off on MiraLAX and other stool softeners/laxatives when initiating the Linzess.  If suboptimal response, will increase 145 mcg/day.  Please confirm insurance coverage for this medication.  Continue adequate hydration with at least 64 ounces of water/day, high-fiber diet, adding fiber supplement as needed for goal of 25 g of fiber/day.

## 2022-07-02 NOTE — Telephone Encounter (Signed)
Ok thanks. Start Amitiza 24 mcg BID. 90 day supply RF3. Same precautions with starting as Linzess recommendations. Thanks.

## 2022-07-03 ENCOUNTER — Other Ambulatory Visit: Payer: Self-pay

## 2022-07-03 MED ORDER — LUBIPROSTONE 24 MCG PO CAPS: 24.0000 ug | ORAL_CAPSULE | Freq: Two times a day (BID) | ORAL | 3 refills | Status: DC

## 2022-07-16 ENCOUNTER — Other Ambulatory Visit: Payer: Self-pay

## 2022-07-16 ENCOUNTER — Emergency Department (HOSPITAL_BASED_OUTPATIENT_CLINIC_OR_DEPARTMENT_OTHER)
Admission: EM | Admit: 2022-07-16 | Discharge: 2022-07-16 | Disposition: A | Discharge status: Home / Self Care | Payer: Medicare HMO | Attending: Emergency Medicine | Admitting: Emergency Medicine

## 2022-07-16 ENCOUNTER — Encounter (HOSPITAL_BASED_OUTPATIENT_CLINIC_OR_DEPARTMENT_OTHER): Payer: Self-pay | Admitting: *Deleted

## 2022-07-16 DIAGNOSIS — I480 Paroxysmal atrial fibrillation: Secondary | ICD-10-CM | POA: Diagnosis not present

## 2022-07-16 DIAGNOSIS — Z7901 Long term (current) use of anticoagulants: Secondary | ICD-10-CM | POA: Insufficient documentation

## 2022-07-16 DIAGNOSIS — E1165 Type 2 diabetes mellitus with hyperglycemia: Secondary | ICD-10-CM | POA: Insufficient documentation

## 2022-07-16 DIAGNOSIS — I110 Hypertensive heart disease with heart failure: Secondary | ICD-10-CM | POA: Insufficient documentation

## 2022-07-16 DIAGNOSIS — Z7984 Long term (current) use of oral hypoglycemic drugs: Secondary | ICD-10-CM | POA: Insufficient documentation

## 2022-07-16 DIAGNOSIS — Z8546 Personal history of malignant neoplasm of prostate: Secondary | ICD-10-CM | POA: Diagnosis not present

## 2022-07-16 DIAGNOSIS — I509 Heart failure, unspecified: Secondary | ICD-10-CM | POA: Diagnosis not present

## 2022-07-16 DIAGNOSIS — R112 Nausea with vomiting, unspecified: Secondary | ICD-10-CM | POA: Diagnosis present

## 2022-07-16 DIAGNOSIS — Z79899 Other long term (current) drug therapy: Secondary | ICD-10-CM | POA: Diagnosis not present

## 2022-07-16 DIAGNOSIS — Z20822 Contact with and (suspected) exposure to covid-19: Secondary | ICD-10-CM | POA: Diagnosis not present

## 2022-07-16 DIAGNOSIS — Z794 Long term (current) use of insulin: Secondary | ICD-10-CM | POA: Insufficient documentation

## 2022-07-16 DIAGNOSIS — R197 Diarrhea, unspecified: Secondary | ICD-10-CM | POA: Insufficient documentation

## 2022-07-16 LAB — RESP PANEL BY RT-PCR (FLU A&B, COVID) ARPGX2
Influenza A by PCR: NEGATIVE
Influenza B by PCR: NEGATIVE
SARS Coronavirus 2 by RT PCR: NEGATIVE

## 2022-07-16 LAB — CBC
HCT: 40.6 % (ref 39.0–52.0)
Hemoglobin: 14.3 g/dL (ref 13.0–17.0)
MCH: 30.6 pg (ref 26.0–34.0)
MCHC: 35.2 g/dL (ref 30.0–36.0)
MCV: 86.8 fL (ref 80.0–100.0)
Platelets: 264 10*3/uL (ref 150–400)
RBC: 4.68 MIL/uL (ref 4.22–5.81)
RDW: 13.6 % (ref 11.5–15.5)
WBC: 7.1 10*3/uL (ref 4.0–10.5)
nRBC: 0 % (ref 0.0–0.2)

## 2022-07-16 LAB — COMPREHENSIVE METABOLIC PANEL
ALT: 18 U/L (ref 0–44)
AST: 22 U/L (ref 15–41)
Albumin: 4.1 g/dL (ref 3.5–5.0)
Alkaline Phosphatase: 43 U/L (ref 38–126)
Anion gap: 15 (ref 5–15)
BUN: 25 mg/dL — ABNORMAL HIGH (ref 8–23)
CO2: 24 mmol/L (ref 22–32)
Calcium: 9.8 mg/dL (ref 8.9–10.3)
Chloride: 100 mmol/L (ref 98–111)
Creatinine, Ser: 1.65 mg/dL — ABNORMAL HIGH (ref 0.61–1.24)
GFR, Estimated: 43 mL/min — ABNORMAL LOW (ref >60)
Glucose, Bld: 172 mg/dL — ABNORMAL HIGH (ref 70–99)
Potassium: 4 mmol/L (ref 3.5–5.1)
Sodium: 139 mmol/L (ref 135–145)
Total Bilirubin: 1.4 mg/dL — ABNORMAL HIGH (ref 0.3–1.2)
Total Protein: 7.1 g/dL (ref 6.5–8.1)

## 2022-07-16 LAB — URINALYSIS, ROUTINE W REFLEX MICROSCOPIC
Bilirubin Urine: NEGATIVE
Glucose, UA: >1000 mg/dL — AB
Hgb urine dipstick: NEGATIVE
Ketones, ur: 40 mg/dL — AB
Leukocytes,Ua: NEGATIVE
Nitrite: NEGATIVE
Specific Gravity, Urine: 1.037 — ABNORMAL HIGH (ref 1.005–1.030)
pH: 5 (ref 5.0–8.0)

## 2022-07-16 LAB — LIPASE, BLOOD: Lipase: 19 U/L (ref 11–51)

## 2022-07-16 MED ORDER — SODIUM CHLORIDE 0.9 % IV BOLUS
1000.0000 mL | Freq: Once | INTRAVENOUS | Status: AC
  Administered 2022-07-16: 1000 mL via INTRAVENOUS

## 2022-07-16 MED ORDER — ONDANSETRON HCL 4 MG/2ML IJ SOLN
4.0000 mg | Freq: Once | INTRAMUSCULAR | Status: AC
  Administered 2022-07-16: 4 mg via INTRAVENOUS
  Filled 2022-07-16: qty 2

## 2022-07-16 MED ORDER — ONDANSETRON HCL 4 MG PO TABS: 4.0000 mg | ORAL_TABLET | Freq: Four times a day (QID) | ORAL | 0 refills | Status: DC

## 2022-07-16 NOTE — Discharge Instructions (Signed)
Recommend plenty fluids, especially electrolyte repletion fluids such as Gatorade, Pedialyte, stick to a bland diet, have attached a handout to help with this.  I have prescribed you some Zofran to help with your nausea symptoms, I recommend that you follow-up with your PCP and gastroenterologist as soon as you are able to further discuss your ongoing nausea, vomiting, and diarrhea.  Please return to the emergency department if you develop worsening nausea and vomiting, cannot tolerate anything to eat, or develop severe abdominal pain.

## 2022-07-16 NOTE — ED Notes (Signed)
Correct amount of fluid should be 1000 on 1L bolus not 500

## 2022-07-16 NOTE — ED Notes (Signed)
Pt stated that he was no longer nauseas and is feeling a little better

## 2022-07-16 NOTE — ED Provider Notes (Signed)
[facility] EMERGENCY DEPT Provider Note   CSN: [id number] Arrival date & time: 07/16/22  1517     History  Chief Complaint  Patient presents with   Emesis   Diarrhea    Walter [name] is a 76 y.o. male past medical history significant for hypertension, paroxysmal A-fib, previous stroke, diabetes, with some chronic GI issues, as well as congestive heart failure, hyperlipidemia, prostate cancer, Barrett's esophagus who presents with concern for high volume nausea, vomiting, diarrhea since yesterday.  Patient reports he has been intermittently having cycles of same last for several days at a time, with 1 to 2 weeks of remittance, while he is having severe episodes of nausea, vomiting, diarrhea patient reports that he repeatedly gets somewhat dehydrated, he has been to see the GI doctor had an endoscopy, colonoscopy, diagnosed with nonspecific idiopathic chronic constipation.  Patient reports no significant change in diet prior to recent onset of nausea, vomiting, diarrhea.  He denies any chest pain, shortness of breath, he reports no significant fever, chills, abdominal pain.  Reports 40 pounds of weight loss since January secondary to these cycles of constipation, diarrhea, nausea, vomiting.   Emesis Associated symptoms: diarrhea   Diarrhea Associated symptoms: vomiting        Home Medications Prior to Admission medications   Medication Sig Start Date End Date Taking? Authorizing Provider  ondansetron (ZOFRAN) 4 MG tablet Take 1 tablet (4 mg total) by mouth every 6 (six) hours. 07/16/22  Yes Kyndahl Jablon H, PA-C  amiodarone (PACERONE) 200 MG tablet TAKE 1 TABLET BY MOUTH EVERY DAY 06/12/22   Belva Crome, MD  buPROPion (WELLBUTRIN XL) 150 MG 24 hr tablet Take 150 mg by mouth daily.    [provider]  calcium carbonate (TUMS - DOSED IN MG ELEMENTAL CALCIUM) 500 MG chewable tablet Chew 1 tablet by mouth daily as needed for indigestion or heartburn.     [provider]  FARXIGA 5 MG TABS tablet Take 5 mg by mouth daily. 05/07/22   [provider]  fesoterodine (TOVIAZ) 8 MG TB24 tablet Take 8 mg by mouth daily. 05/24/21   [provider]  levothyroxine (SYNTHROID) 88 MCG tablet Take 88 mcg by mouth every morning. 10/30/21   [provider]  lubiprostone (AMITIZA) 24 MCG capsule Take 1 capsule (24 mcg total) by mouth 2 (two) times daily with a meal. 07/03/22   Cirigliano, Vito V, DO  metoprolol succinate (TOPROL-XL) 25 MG 24 hr tablet TAKE 1 TABLET (25 MG TOTAL) BY MOUTH DAILY. 04/05/22   Belva Crome, MD  Omega-3-acid Ethyl Esters (LOVAZA PO) Take 1 tablet by mouth 2 (two) times daily.    [provider]  Rivaroxaban (XARELTO) 15 MG TABS tablet Take 1 tablet (15 mg total) by mouth daily with supper. 03/06/22   Belva Crome, MD  rosuvastatin (CRESTOR) 10 MG tablet Take 10 mg by mouth at bedtime.    [provider]  tamsulosin (FLOMAX) 0.4 MG CAPS capsule Take 0.8 mg by mouth every other day.    [provider]  TRULICITY 1.5 ZW/2.5EN SOPN Inject 1.5 mg into the skin once a week. 06/19/21   [provider]      Allergies    Tape, Aspirin, Byetta 10 mcg pen [exenatide], Invokana [canagliflozin], Levemir [insulin detemir], Nsaids, and Short ragweed pollen ext    Review of Systems   Review of Systems  Gastrointestinal:  Positive for diarrhea and vomiting.  All other systems reviewed and are negative.  Physical Exam Updated Vital Signs BP 119/75   Pulse 73   Temp (!) 97.1 F (36.2 C) (Oral)   Resp 10   SpO2 96%  Physical Exam Vitals and nursing note reviewed.  Constitutional:      General: He is not in acute distress.    Appearance: Normal appearance.  HENT:     Head: Normocephalic and atraumatic.     Mouth/Throat:     Mouth: Mucous membranes are dry.  Eyes:     General:        Right eye: No discharge.        Left eye: No discharge.  Cardiovascular:     Rate  and Rhythm: Normal rate and regular rhythm.     Heart sounds: No murmur heard.    No friction rub. No gallop.  Pulmonary:     Effort: Pulmonary effort is normal.     Breath sounds: Normal breath sounds.  Abdominal:     General: Bowel sounds are normal.     Palpations: Abdomen is soft.     Comments: No tenderness to palpation of abdomen  Skin:    General: Skin is warm and dry.     Capillary Refill: Capillary refill takes less than 2 seconds.     Comments: Decreased skin turgor  Neurological:     Mental Status: He is alert and oriented to person, place, and time.  Psychiatric:        Mood and Affect: Mood normal.        Behavior: Behavior normal.     ED Results / Procedures / Treatments   Labs (all labs ordered are listed, but only abnormal results are displayed) Labs Reviewed  COMPREHENSIVE METABOLIC PANEL - Abnormal; Notable for the following components:      Result Value   Glucose, Bld 172 (*)    BUN 25 (*)    Creatinine, Ser 1.65 (*)    Total Bilirubin 1.4 (*)    GFR, Estimated 43 (*)    All other components within normal limits  RESP PANEL BY RT-PCR (FLU A&B, COVID) ARPGX2  LIPASE, BLOOD  CBC  URINALYSIS, ROUTINE W REFLEX MICROSCOPIC    EKG EKG Interpretation  Date/Time:  Monday July 16 2022 16:22:02 EDT Ventricular Rate:  117 PR Interval:    QRS Duration: 103 QT Interval:  339 QTC Calculation: 473 R Axis:   -65 Text Interpretation: Artifact Left anterior fascicular block Abnormal R-wave progression, late transition Repol abnrm suggests ischemia, lateral leads Confirmed by Blanchie Dessert [phone]) on 07/16/2022 6:42:02 PM  Radiology No results found.  Procedures Procedures    Medications Ordered in ED Medications  sodium chloride 0.9 % bolus 1,000 mL (0 mLs Intravenous Stopped 07/16/22 1710)  ondansetron (ZOFRAN) injection 4 mg (4 mg Intravenous Given 07/16/22 1638)    ED Course/ Medical Decision Making/ A&P                           Medical  Decision Making Amount and/or Complexity of Data Reviewed Labs: ordered.  Risk Prescription drug management.   This patient is a 76 y.o. male who presents to the ED for concern of nausea, vomiting, diarrhea, this involves an extensive number of treatment options, and is a complaint that carries with it a high risk of complications and morbidity. The emergent differential diagnosis prior to evaluation includes, but is not limited to, gastroenteritis, gastritis, colitis, ongoing symptoms related to idiopathic constipation, viral illness, less clinical  concern for obstruction as patient is having high-volume diarrhea per patient.  He has not had any recent antibiotics, low clinical concern for C. difficile..   This is not an exhaustive differential.   Past Medical History / Co-morbidities / Social History: hypertension, paroxysmal A-fib, previous stroke, diabetes, with some chronic GI issues, as well as congestive heart failure, hyperlipidemia, prostate cancer, Barrett's esophagus  Additional history: Chart reviewed. Pertinent results include: Extensively reviewed outpatient gastroenterology, cardiology, pulmonology visits, patient with previous colonoscopy, endoscopy for these ongoing changes  Physical Exam: Physical exam performed. The pertinent findings include: Patient is clinically with signs of some mild dehydration, we will replete with fluids, he has no abdominal pain on exam,  Lab Tests: I ordered, and personally interpreted labs.  The pertinent results include: CMP notable for elevated BUN, creatinine, mild bump from baseline, we will replete with fluids as discussed.  Mildly elevated total bilirubin at 1.4, with no right upper quadrant pain suspicion for primary gallbladder or liver pathology.  Mild hyperglycemia with glucose 172.  CBC is unremarkable, notably no leukocytosis or anemia, lipase normal, COVID, flu PCR are negative.   Cardiac Monitoring:  The patient was maintained on a  cardiac monitor.  My attending physician Dr. Maryan Rued viewed and interpreted the cardiac monitored which showed an underlying rhythm of: NSR, some borderline T wave abnormalities. I agree with this interpretation.   Medications: I ordered medication including fluid bolus, Zofran for nausea, dehydration. Reevaluation of the patient after these medicines showed that the patient improved. I have reviewed the patients home medicines and have made adjustments as needed.  Disposition: After consideration of the diagnostic results and the patients response to treatment, I feel that patient's work-up in the emergency department is reassuring today, it is unclear what is causing his cyclical episodes of nausea, vomiting, diarrhea, but without any abdominal pain today I do not believe that patient would benefit from any more advanced imaging.  He is feeling much improved after fluid bolus and Zofran.  We will discharge with Zofran, encouraged bland diet as well as plenty of fluids, as well as PCP and GI follow-up if any ongoing symptoms, return precautions given..   emergency department workup does not suggest an emergent condition requiring admission or immediate intervention beyond what has been performed at this time. The plan is: as above. The patient is safe for discharge and has been instructed to return immediately for worsening symptoms, change in symptoms or any other concerns.  I discussed this case with my attending physician Dr. Maryan Rued who cosigned this note including patient's presenting symptoms, physical exam, and planned diagnostics and interventions. Attending physician stated agreement with plan or made changes to plan which were implemented.    Final Clinical Impression(s) / ED Diagnoses Final diagnoses:  Nausea vomiting and diarrhea    Rx / DC Orders ED Discharge Orders          Ordered    ondansetron (ZOFRAN) 4 MG tablet  Every 6 hours        07/16/22 1921               Dorien Chihuahua 07/16/22 1936    Blanchie Dessert, MD 07/19/22 1735

## 2022-07-16 NOTE — ED Notes (Signed)
Patient BIB [facility] from Home.  Per EMS, endorses N/V/D. For approximately 12-24 Hours. VSS with EMS en Route. A&Ox4. GCS 15. BIB Stretcher and transferred to Wheelchair.

## 2022-07-16 NOTE — ED Triage Notes (Signed)
Pt is here by [facility] for nausea, vomiting and diarrhea since yesterday.  No abdominal pain or fever with this.

## 2022-07-19 ENCOUNTER — Encounter: Payer: Self-pay | Admitting: Gastroenterology

## 2022-07-23 ENCOUNTER — Ambulatory Visit: Payer: Medicare HMO | Admitting: Diagnostic Neuroimaging

## 2022-07-24 ENCOUNTER — Other Ambulatory Visit: Payer: Self-pay

## 2022-07-24 DIAGNOSIS — R112 Nausea with vomiting, unspecified: Secondary | ICD-10-CM

## 2022-07-24 DIAGNOSIS — R197 Diarrhea, unspecified: Secondary | ICD-10-CM

## 2022-07-24 DIAGNOSIS — K5900 Constipation, unspecified: Secondary | ICD-10-CM

## 2022-07-24 DIAGNOSIS — R634 Abnormal weight loss: Secondary | ICD-10-CM

## 2022-07-24 NOTE — Telephone Encounter (Signed)
Spoke with pt and let him know recommendations. Pt stated he already had a primary care provider that he is happy with and would prefer to stay with that provider. Orders placed and message sent to radiology scheduling. Pt aware he needs to stop laxatives and fiber on Friday 10/20 to come in for Sitz marker study on 10/25 and KUB on 10/30. Reminder placed to call pt about KUB. F/u appt scheduled on 11/29 at 10:40 am with Dr. Bryan Lemma.

## 2022-07-31 ENCOUNTER — Encounter: Payer: Self-pay | Admitting: Gastroenterology

## 2022-08-01 ENCOUNTER — Telehealth: Payer: Self-pay

## 2022-08-01 NOTE — Telephone Encounter (Signed)
Pt scheduled for CT abd/pelvis on 10/30 at 4 pm. Pt needs to arrive at 1:45 pm. Gastric emptying study scheduled for 08/14/22 at 8 am. Instructions sent to pt's mychart and will go over instructions in person when pt arrives for sitz marker study today.

## 2022-08-01 NOTE — Telephone Encounter (Signed)
Pt arrived at office to swallow sitz marker pill. Pt swallowed pill without difficulty. Pt aware he needs to come back to office for an xray on 08/06/22. Instructions for gastric emptying study and CT abd/pelvis given to pt.

## 2022-08-06 ENCOUNTER — Ambulatory Visit (INDEPENDENT_AMBULATORY_CARE_PROVIDER_SITE_OTHER)
Admission: RE | Admit: 2022-08-06 | Discharge: 2022-08-06 | Disposition: A | Discharge status: Home / Self Care | Payer: Medicare HMO | Source: Ambulatory Visit | Attending: Gastroenterology | Admitting: Gastroenterology

## 2022-08-06 ENCOUNTER — Encounter: Payer: Self-pay | Admitting: *Deleted

## 2022-08-06 ENCOUNTER — Ambulatory Visit (HOSPITAL_COMMUNITY)
Admission: RE | Admit: 2022-08-06 | Discharge: 2022-08-06 | Disposition: A | Discharge status: Home / Self Care | Payer: Medicare HMO | Source: Ambulatory Visit | Attending: Gastroenterology | Admitting: Gastroenterology

## 2022-08-06 DIAGNOSIS — K5900 Constipation, unspecified: Secondary | ICD-10-CM | POA: Insufficient documentation

## 2022-08-06 DIAGNOSIS — R112 Nausea with vomiting, unspecified: Secondary | ICD-10-CM | POA: Insufficient documentation

## 2022-08-06 DIAGNOSIS — R197 Diarrhea, unspecified: Secondary | ICD-10-CM | POA: Diagnosis present

## 2022-08-06 DIAGNOSIS — R634 Abnormal weight loss: Secondary | ICD-10-CM

## 2022-08-06 MED ORDER — IOHEXOL 300 MG/ML  SOLN
80.0000 mL | Freq: Once | INTRAMUSCULAR | Status: AC | PRN
  Administered 2022-08-06: 80 mL via INTRAVENOUS

## 2022-08-06 MED ORDER — IOHEXOL 9 MG/ML PO SOLN
ORAL | Status: AC
  Filled 2022-08-06: qty 1000

## 2022-08-06 MED ORDER — SODIUM CHLORIDE (PF) 0.9 % IJ SOLN
INTRAMUSCULAR | Status: AC
  Filled 2022-08-06: qty 50

## 2022-08-07 ENCOUNTER — Ambulatory Visit (INDEPENDENT_AMBULATORY_CARE_PROVIDER_SITE_OTHER): Payer: Medicare HMO | Admitting: Diagnostic Neuroimaging

## 2022-08-07 ENCOUNTER — Encounter: Payer: Self-pay | Admitting: Diagnostic Neuroimaging

## 2022-08-07 VITALS — BP 107/64 | HR 58 | Ht 67.0 in | Wt 164.0 lb

## 2022-08-07 DIAGNOSIS — M21371 Foot drop, right foot: Secondary | ICD-10-CM

## 2022-08-07 DIAGNOSIS — M21372 Foot drop, left foot: Secondary | ICD-10-CM

## 2022-08-07 NOTE — Progress Notes (Signed)
[facility]  PATIENT: Walter [name] DOB: 02-01-1946  REFERRING CLINICIAN: Charlane Ferretti, MD HISTORY FROM: patient and wife REASON FOR VISIT: new consult    HISTORICAL  CHIEF COMPLAINT:  Chief Complaint  Patient presents with   Establish Care     Pt is wondering if he had another stroke and if its due to is foot drop. Room 7 with wife    HISTORY OF PRESENT ILLNESS:   UPDATE (08/07/22, VRP): Since last visit, now with new onset foot drop in August 2023. Tried pickleball 1st time in many years, fell twice on bottom. Then a few days later noticed left foot drop, then right foot drop. No major numbness or pain. Worsened for 4-6 weeks, now plateaued. DM has been worse. Also with constipation, then weight loss, 40lbs since Jan 2023.   UPDATE (07/03/21, VRP): Since last visit, here for eval of bilateral hand numbness since 10-15 years. Tried wrists splints without relief. 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 Also mentions history of his parents who are holocaust survivors.   PRIOR HPI (4548): 76 year old male here for evaluation of dizziness, numbness, balance and gait difficulty.  Patient had stroke in March 2016 due to left MCA infarct.  Patient had symptoms of balance and dizziness since that time.  Patient mentioned these to Dr. Erlinda Hong in stroke clinic in 2017.  Continues to have complaints of memory, dizziness, balance, weakness, especially when he stands up.  He feels like his balance is off.  Also patient has had 2 episodes of passing out in the last 3 years.  He has followed up with cardiology with his.   REVIEW OF SYSTEMS: Full 14 system review of systems performed and negative with exception of: as per HPI.  ALLERGIES: Allergies  Allergen Reactions   Tape Other (See Comments)    Skin tears    Aspirin Other (See Comments)   Byetta 10 Mcg Pen [Exenatide] Nausea Only   Invokana [Canagliflozin] Other (See Comments)     Orthostatic hypotension   Levemir [Insulin Detemir] Other (See Comments)    Burning at injection site   Nsaids    Short Ragweed Pollen Ext Cough    HOME MEDICATIONS: Outpatient Medications Prior to Visit  Medication Sig Dispense Refill   amiodarone (PACERONE) 200 MG tablet TAKE 1 TABLET BY MOUTH EVERY DAY 90 tablet 1   buPROPion (WELLBUTRIN XL) 150 MG 24 hr tablet Take 150 mg by mouth daily.     FARXIGA 5 MG TABS tablet Take 5 mg by mouth daily.     fesoterodine (TOVIAZ) 8 MG TB24 tablet Take 8 mg by mouth daily.     levothyroxine (SYNTHROID) 88 MCG tablet Take 88 mcg by mouth every morning.     metoprolol succinate (TOPROL-XL) 25 MG 24 hr tablet TAKE 1 TABLET (25 MG TOTAL) BY MOUTH DAILY. 90 tablet 1   Omega-3-acid Ethyl Esters (LOVAZA PO) Take 1 tablet by mouth 2 (two) times daily.     Rivaroxaban (XARELTO) 15 MG TABS tablet Take 1 tablet (15 mg total) by mouth daily with supper. 90 tablet 1   rosuvastatin (CRESTOR) 10 MG tablet Take 10 mg by mouth at bedtime.     tamsulosin (FLOMAX) 0.4 MG CAPS capsule Take 0.8 mg by mouth every other day.     lubiprostone (AMITIZA) 24 MCG capsule Take 1 capsule (24 mcg total) by mouth 2 (two) times daily with a meal. (Patient not  taking: Reported on 08/07/2022) 967 capsule 3   TRULICITY 1.5 EL/3.8BO SOPN Inject 1.5 mg into the skin once a week. (Patient not taking: Reported on 08/07/2022)     calcium carbonate (TUMS - DOSED IN MG ELEMENTAL CALCIUM) 500 MG chewable tablet Chew 1 tablet by mouth daily as needed for indigestion or heartburn.     ondansetron (ZOFRAN) 4 MG tablet Take 1 tablet (4 mg total) by mouth every 6 (six) hours. 18 tablet 0   No facility-administered medications prior to visit.    PAST MEDICAL HISTORY: Past Medical History:  Diagnosis Date   Abdominal pain    Atrial fibrillation (HCC)    Barrett's esophagus    CAD (coronary artery disease)    Cancer (HCC)    prostate   CHF (congestive heart failure) (HCC)    Chronic  kidney disease    Colon polyp    CVA (cerebral vascular accident) ([place]) 12/2014   Depression    Diabetes mellitus    w/neuropathy   Gum disease    Hiatal hernia    HLD (hyperlipidemia)    Hypertension    Kidney stone    hx of   Left foot drop    Numbness    hands, feet   OSA on CPAP    Paresthesia of bilateral legs    Pleural effusion    Pneumonia    Prostate cancer ([place]) 2007   radiation tx [place]   PTSD (post-traumatic stress disorder)    Stroke ([place])    Vertigo     PAST SURGICAL HISTORY: Past Surgical History:  Procedure Laterality Date   CATARACT EXTRACTION Bilateral 2006 and 2007   bilateral   COLON SURGERY  05/2011   hemicolectomy   COLONOSCOPY     CORONARY ARTERY BYPASS GRAFT  2004   x3    FAMILY HISTORY: Family History  Problem Relation Age of Onset   Angina Mother    Hypertension Mother    Heart disease Mother    Diabetes Father    Coronary artery disease Father    Stroke Father    Hyperlipidemia Sister    Neuropathy Sister    Colon cancer Neg Hx     SOCIAL HISTORY: Social History   Socioeconomic History   Marital status: Married    Spouse name: Public affairs consultant   Number of children: 2   Years of education: BFA   Highest education level: Not on file  Occupational History   Occupation: Retired     Comment: retired Engineer, technical sales  Tobacco Use   Smoking status: Former    Types: Cigarettes    Quit date: 2013    Years since quitting: 10.8   Smokeless tobacco: Never  Vaping Use   Vaping Use: Some days   Devices: once a week  Substance and Sexual Activity   Alcohol use: Yes    Alcohol/week: 3.0 standard drinks of alcohol    Types: 1 Glasses of wine, 1 Cans of beer, 1 Shots of liquor per week    Comment: social   Drug use: Not Currently    Types: Marijuana    Comment: frequently--THC   Sexual activity: Not on file  Other Topics Concern   Not on file  Social History Narrative   Left handed    Lives with wife   Caffeine -2 cups per day   Social  Determinants of Health   Financial Resource Strain: Not on file  Food Insecurity: Not on file  Transportation Needs: Not on file  Physical Activity: Not on file  Stress: Not on file  Social Connections: Not on file  Intimate Partner Violence: Not on file     PHYSICAL EXAM  GENERAL EXAM/CONSTITUTIONAL: Vitals:  Vitals:   08/07/22 1358  BP: 107/64  Pulse: (!) 58  Weight: 164 lb (74.4 kg)  Height: '5\' 7"'$  (1.702 m)   Body mass index is 25.69 kg/m. Wt Readings from Last 3 Encounters:  08/07/22 164 lb (74.4 kg)  06/15/22 163 lb (73.9 kg)  06/04/22 161 lb 9.6 oz (73.3 kg)   Patient is in no distress; well developed, nourished and groomed; neck is supple  CARDIOVASCULAR: Examination of carotid arteries is normal; no carotid bruits Regular rate and rhythm, no murmurs Examination of peripheral vascular system by observation and palpation is normal  EYES: Ophthalmoscopic exam of optic discs and posterior segments is normal; no papilledema or hemorrhages No results found.  MUSCULOSKELETAL: Gait, strength, tone, movements noted in Neurologic exam below  NEUROLOGIC: MENTAL STATUS:      No data to display         awake, alert, oriented to person, place and time recent and remote memory intact normal attention and concentration language fluent, comprehension intact, naming intact fund of knowledge appropriate  CRANIAL NERVE:  2nd - no papilledema on fundoscopic exam 2nd, 3rd, 4th, 6th - pupils equal and reactive to light, visual fields full to confrontation, extraocular muscles intact, no nystagmus 5th - facial sensation symmetric 7th - facial strength symmetric 8th - hearing intact 9th - palate elevates symmetrically, uvula midline 11th - shoulder shrug symmetric 12th - tongue protrusion midline  MOTOR:  normal bulk and tone, full strength in the BUE, BLE ATROPHY AND WEAKNESS OF BILATERAL APB EXCEPT BILATERAL WEAKNESS OF FOOT DF AND EVERSION  SENSORY:  normal  and symmetric to light touch, temperature, vibration DECR IN DIGITS 1-3  COORDINATION:  finger-nose-finger, fine finger movements normal  REFLEXES:  deep tendon reflexes TRACE and symmetric  GAIT/STATION:  narrow based gait; STEPPAGE GAIT    DIAGNOSTIC DATA (LABS, IMAGING, TESTING) - I reviewed patient records, labs, notes, testing and imaging myself where available.  Lab Results  Component Value Date   WBC 7.1 07/16/2022   HGB 14.3 07/16/2022   HCT 40.6 07/16/2022   MCV 86.8 07/16/2022   PLT 264 07/16/2022      Component Value Date/Time   NA 139 07/16/2022 1531   NA 141 02/06/2022 1520   K 4.0 07/16/2022 1531   CL 100 07/16/2022 1531   CO2 24 07/16/2022 1531   GLUCOSE 172 (H) 07/16/2022 1531   BUN 25 (H) 07/16/2022 1531   BUN 17 02/06/2022 1520   CREATININE 1.65 (H) 07/16/2022 1531   CREATININE 1.34 (H) 07/19/2016 1015   CALCIUM 9.8 07/16/2022 1531   PROT 7.1 07/16/2022 1531   PROT 6.5 02/26/2019 0824   ALBUMIN 4.1 07/16/2022 1531   ALBUMIN 4.1 02/26/2019 0824   AST 22 07/16/2022 1531   ALT 18 07/16/2022 1531   ALKPHOS 43 07/16/2022 1531   BILITOT 1.4 (H) 07/16/2022 1531   BILITOT 0.6 02/26/2019 0824   GFRNONAA 43 (L) 07/16/2022 1531   GFRAA 53 (L) 02/26/2019 0824   Lab Results  Component Value Date   CHOL 109 02/26/2019   HDL 51 02/26/2019   LDLCALC 33 02/26/2019   TRIG 126 02/26/2019   CHOLHDL 2.1 02/26/2019   Lab Results  Component Value Date   HGBA1C 6.8 (H) 12/11/2020   Lab Results  Component Value Date  [id number] 893 12/31/2014   Lab Results  Component Value Date   TSH 6.740 (H) 03/31/2019    12/31/14 MRI brain [I reviewed images myself and agree with interpretation. -VRP]  1. Acute/subacute cortical infarcts involving the left frontal operculum and to lesser extent the left precentral gyrus. 2. Mild atrophy and white matter changes otherwise reflect the sequelae of chronic microvascular ischemia.    ASSESSMENT AND PLAN  76 y.o.  year old male here with:  Dx:  1. 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  Return for pending if symptoms worsen or fail to improve.    [name], MD 20/80/2233, 6:12 PM Certified in Neurology, Neurophysiology and Neuroimaging  [facility] [address], [place], [place] [address] [phone]

## 2022-08-10 ENCOUNTER — Encounter: Payer: Self-pay | Admitting: Gastroenterology

## 2022-08-13 NOTE — Telephone Encounter (Signed)
Very glad to hear that Walter [name] has established with Dr. Louis Matte!  While I cannot directly access records from [facility] internal medicine, we can request for records periodically when needed, and otherwise agree that she should have access to our records through [facility].  Thank you for the update.

## 2022-08-14 ENCOUNTER — Encounter (HOSPITAL_COMMUNITY)
Admission: RE | Admit: 2022-08-14 | Discharge: 2022-08-14 | Disposition: A | Discharge status: Home / Self Care | Payer: Medicare HMO | Source: Ambulatory Visit | Attending: Gastroenterology | Admitting: Gastroenterology

## 2022-08-14 DIAGNOSIS — R112 Nausea with vomiting, unspecified: Secondary | ICD-10-CM | POA: Diagnosis present

## 2022-08-14 DIAGNOSIS — K5900 Constipation, unspecified: Secondary | ICD-10-CM | POA: Diagnosis present

## 2022-08-14 DIAGNOSIS — R634 Abnormal weight loss: Secondary | ICD-10-CM | POA: Insufficient documentation

## 2022-08-14 DIAGNOSIS — R197 Diarrhea, unspecified: Secondary | ICD-10-CM | POA: Diagnosis present

## 2022-08-14 MED ORDER — TECHNETIUM TC 99M SULFUR COLLOID
2.0000 | Freq: Once | INTRAVENOUS | Status: AC
  Administered 2022-08-14: 2 via ORAL

## 2022-08-23 ENCOUNTER — Other Ambulatory Visit: Payer: Self-pay | Admitting: Interventional Cardiology

## 2022-08-24 ENCOUNTER — Encounter (HOSPITAL_BASED_OUTPATIENT_CLINIC_OR_DEPARTMENT_OTHER): Payer: Self-pay

## 2022-08-24 ENCOUNTER — Other Ambulatory Visit: Payer: Self-pay

## 2022-08-24 ENCOUNTER — Emergency Department (HOSPITAL_BASED_OUTPATIENT_CLINIC_OR_DEPARTMENT_OTHER)
Admission: EM | Admit: 2022-08-24 | Discharge: 2022-08-24 | Disposition: A | Discharge status: Home / Self Care | Payer: Medicare HMO | Attending: Emergency Medicine | Admitting: Emergency Medicine

## 2022-08-24 DIAGNOSIS — I110 Hypertensive heart disease with heart failure: Secondary | ICD-10-CM | POA: Insufficient documentation

## 2022-08-24 DIAGNOSIS — R197 Diarrhea, unspecified: Secondary | ICD-10-CM | POA: Diagnosis not present

## 2022-08-24 DIAGNOSIS — Z79899 Other long term (current) drug therapy: Secondary | ICD-10-CM | POA: Insufficient documentation

## 2022-08-24 DIAGNOSIS — Z794 Long term (current) use of insulin: Secondary | ICD-10-CM | POA: Insufficient documentation

## 2022-08-24 DIAGNOSIS — Z8546 Personal history of malignant neoplasm of prostate: Secondary | ICD-10-CM | POA: Insufficient documentation

## 2022-08-24 DIAGNOSIS — R42 Dizziness and giddiness: Secondary | ICD-10-CM | POA: Diagnosis not present

## 2022-08-24 DIAGNOSIS — E119 Type 2 diabetes mellitus without complications: Secondary | ICD-10-CM | POA: Insufficient documentation

## 2022-08-24 DIAGNOSIS — R112 Nausea with vomiting, unspecified: Secondary | ICD-10-CM | POA: Diagnosis not present

## 2022-08-24 DIAGNOSIS — I509 Heart failure, unspecified: Secondary | ICD-10-CM | POA: Insufficient documentation

## 2022-08-24 DIAGNOSIS — D649 Anemia, unspecified: Secondary | ICD-10-CM | POA: Insufficient documentation

## 2022-08-24 DIAGNOSIS — Z7901 Long term (current) use of anticoagulants: Secondary | ICD-10-CM | POA: Insufficient documentation

## 2022-08-24 LAB — URINALYSIS, ROUTINE W REFLEX MICROSCOPIC
Bilirubin Urine: NEGATIVE
Glucose, UA: >1000 mg/dL — AB
Hgb urine dipstick: NEGATIVE
Ketones, ur: 15 mg/dL — AB
Leukocytes,Ua: NEGATIVE
Nitrite: NEGATIVE
Protein, ur: NEGATIVE mg/dL
Specific Gravity, Urine: 1.016 (ref 1.005–1.030)
pH: 7 (ref 5.0–8.0)

## 2022-08-24 LAB — COMPREHENSIVE METABOLIC PANEL
ALT: 15 U/L (ref 0–44)
AST: 18 U/L (ref 15–41)
Albumin: 3.7 g/dL (ref 3.5–5.0)
Alkaline Phosphatase: 41 U/L (ref 38–126)
Anion gap: 11 (ref 5–15)
BUN: 19 mg/dL (ref 8–23)
CO2: 25 mmol/L (ref 22–32)
Calcium: 9 mg/dL (ref 8.9–10.3)
Chloride: 103 mmol/L (ref 98–111)
Creatinine, Ser: 1.2 mg/dL (ref 0.61–1.24)
GFR, Estimated: >60 mL/min (ref >60)
Glucose, Bld: 200 mg/dL — ABNORMAL HIGH (ref 70–99)
Potassium: 4.1 mmol/L (ref 3.5–5.1)
Sodium: 139 mmol/L (ref 135–145)
Total Bilirubin: 1.2 mg/dL (ref 0.3–1.2)
Total Protein: 6.2 g/dL — ABNORMAL LOW (ref 6.5–8.1)

## 2022-08-24 LAB — CBC
HCT: 33.9 % — ABNORMAL LOW (ref 39.0–52.0)
Hemoglobin: 11.6 g/dL — ABNORMAL LOW (ref 13.0–17.0)
MCH: 30.5 pg (ref 26.0–34.0)
MCHC: 34.2 g/dL (ref 30.0–36.0)
MCV: 89.2 fL (ref 80.0–100.0)
Platelets: 230 10*3/uL (ref 150–400)
RBC: 3.8 MIL/uL — ABNORMAL LOW (ref 4.22–5.81)
RDW: 13.8 % (ref 11.5–15.5)
WBC: 7.1 10*3/uL (ref 4.0–10.5)
nRBC: 0 % (ref 0.0–0.2)

## 2022-08-24 LAB — OCCULT BLOOD X 1 CARD TO LAB, STOOL: Fecal Occult Bld: NEGATIVE

## 2022-08-24 LAB — LIPASE, BLOOD: Lipase: 20 U/L (ref 11–51)

## 2022-08-24 MED ORDER — SODIUM CHLORIDE 0.9 % IV BOLUS
500.0000 mL | Freq: Once | INTRAVENOUS | Status: AC
  Administered 2022-08-24: 500 mL via INTRAVENOUS

## 2022-08-24 NOTE — Discharge Instructions (Addendum)
I recommend close follow-up with PCP for reevaluation.  Please do not hesitate to return to emergency department if worrisome signs symptoms we discussed become apparent.

## 2022-08-24 NOTE — ED Provider Notes (Signed)
[facility] EMERGENCY DEPT Provider Note   CSN: [id number] Arrival date & time: 08/24/22  1317     History {Add pertinent medical, surgical, social history, OB history to HPI:1} Chief Complaint  Patient presents with   Nausea   Emesis   Diarrhea    Walter [name] is a 76 y.o. male past medical history significant for hypertension, paroxysmal A-fib, previous stroke, diabetes, with some chronic GI issues, as well as congestive heart failure, hyperlipidemia, prostate cancer, Barrett's esophagus presenting to the emergency department for evaluation of shaking chills, denies and diarrhea.  Patient reports he started to feel chills, diarrhea, dizziness and nausea with vomiting this morning.  Patient has had nausea and vomiting in the last few months.  Today he was sitting in front of his computer when he feeling dizziness, nausea and vomiting.  Patient also reports diarrhea that started this morning.  He can barely walk without leaning on something.  He has seen a GI doctor and had multiple tests done which did not show any specific findings except chronic intermittent constipation. He was sent home after previous ER visit with zofran however he did not take any due drug to drug interaction. States THC works for nausea. Denies any chest pain, shortness of breath, fever, constipation urinary symptoms, blood in stool or urine, rash.   Emesis Associated symptoms: diarrhea   Diarrhea Associated symptoms: vomiting        Home Medications Prior to Admission medications   Medication Sig Start Date End Date Taking? Authorizing Provider  amiodarone (PACERONE) 200 MG tablet TAKE 1 TABLET BY MOUTH EVERY DAY 06/12/22   Belva Crome, MD  buPROPion (WELLBUTRIN XL) 150 MG 24 hr tablet Take 150 mg by mouth daily.    [provider]  FARXIGA 5 MG TABS tablet Take 5 mg by mouth daily. 05/07/22   [provider]  fesoterodine (TOVIAZ) 8 MG TB24 tablet Take 8 mg by mouth daily.  05/24/21   [provider]  levothyroxine (SYNTHROID) 88 MCG tablet Take 88 mcg by mouth every morning. 10/30/21   [provider]  lubiprostone (AMITIZA) 24 MCG capsule Take 1 capsule (24 mcg total) by mouth 2 (two) times daily with a meal. Patient not taking: Reported on 08/07/2022 07/03/22   Cirigliano, Vito V, DO  metoprolol succinate (TOPROL-XL) 25 MG 24 hr tablet TAKE 1 TABLET (25 MG TOTAL) BY MOUTH DAILY. 08/23/22   Belva Crome, MD  Omega-3-acid Ethyl Esters (LOVAZA PO) Take 1 tablet by mouth 2 (two) times daily.    [provider]  Rivaroxaban (XARELTO) 15 MG TABS tablet Take 1 tablet (15 mg total) by mouth daily with supper. 03/06/22   Belva Crome, MD  rosuvastatin (CRESTOR) 10 MG tablet Take 10 mg by mouth at bedtime.    [provider]  tamsulosin (FLOMAX) 0.4 MG CAPS capsule Take 0.8 mg by mouth every other day.    [provider]  TRULICITY 1.5 RW/4.3XV SOPN Inject 1.5 mg into the skin once a week. Patient not taking: Reported on 08/07/2022 06/19/21   [provider]      Allergies    Tape, Aspirin, Byetta 10 mcg pen [exenatide], Invokana [canagliflozin], Levemir [insulin detemir], Nsaids, and Short ragweed pollen ext    Review of Systems   Review of Systems  Gastrointestinal:  Positive for diarrhea and vomiting.    Physical Exam Updated Vital Signs BP (!) 174/80   Pulse 63   Resp (!) 9   Ht  $'5\' 7"'m$  (1.702 m)   Wt 72.6 kg   SpO2 100%   BMI 25.06 kg/m  Physical Exam Vitals and nursing note reviewed.  Constitutional:      Appearance: Normal appearance.  HENT:     Head: Normocephalic and atraumatic.     Mouth/Throat:     Mouth: Mucous membranes are moist.  Eyes:     General: No scleral icterus. Cardiovascular:     Rate and Rhythm: Normal rate and regular rhythm.     Pulses: Normal pulses.     Heart sounds: Normal heart sounds.  Pulmonary:     Effort: Pulmonary effort is normal.     Breath sounds: Normal  breath sounds.  Abdominal:     General: Abdomen is flat.     Palpations: Abdomen is soft.     Tenderness: There is no abdominal tenderness.  Musculoskeletal:        General: No deformity.  Skin:    General: Skin is warm.     Findings: No rash.  Neurological:     General: No focal deficit present.     Mental Status: He is alert.     Comments: Moving bilateral upper and lower extremities.  Normal strength throughout.  Pinpoint pupils with intact EOMs.  Cranial nerves II through XII grossly intact.    Psychiatric:        Mood and Affect: Mood normal.     ED Results / Procedures / Treatments   Labs (all labs ordered are listed, but only abnormal results are displayed) Labs Reviewed  COMPREHENSIVE METABOLIC PANEL - Abnormal; Notable for the following components:      Result Value   Glucose, Bld 200 (*)    Total Protein 6.2 (*)    All other components within normal limits  CBC - Abnormal; Notable for the following components:   RBC 3.80 (*)    Hemoglobin 11.6 (*)    HCT 33.9 (*)    All other components within normal limits  URINALYSIS, ROUTINE W REFLEX MICROSCOPIC - Abnormal; Notable for the following components:   Glucose, UA >1,000 (*)    Ketones, ur 15 (*)    All other components within normal limits  LIPASE, BLOOD  OCCULT BLOOD X 1 CARD TO LAB, STOOL    EKG EKG Interpretation  Date/Time:  Friday August 24 2022 13:30:12 EST Ventricular Rate:  59 PR Interval:  60 QRS Duration: 108 QT Interval:  480 QTC Calculation: 476 R Axis:   -37 Text Interpretation: Sinus rhythm Short PR interval Left axis deviation Borderline prolonged QT interval No significant change since prior 10/23 Confirmed by Aletta Edouard [phone]) on 08/24/2022 1:38:18 PM  Radiology No results found.  Procedures Procedures  {Document cardiac monitor, telemetry assessment procedure when appropriate:1}  Medications Ordered in ED Medications  sodium chloride 0.9 % bolus 500 mL (500 mLs  Intravenous New Bag/Given 08/24/22 1431)    ED Course/ Medical Decision Making/ A&P Clinical Course as of 08/24/22 1628  Fri Aug 24, 2022  1444 He has been having nausea vomiting diarrhea constipation on and off for over a year and has had significant weight loss.  Follows with GI.  Had recurrence of same today followed by some dizziness unsteadiness.  Still feels a little dizzy here.  Benign abdominal exam.  Getting labs hydration and reassessment. [MB]    Clinical Course User Index [MB] Hayden Rasmussen, MD  Medical Decision Making Amount and/or Complexity of Data Reviewed Labs: ordered.   This patient presents to the ED for diarrhea, dizziness, nausea, vomiting, this involves an extensive number of treatment options, and is a complaint that carries with a high risk of complications and morbidity.  The differential diagnosis includes viral gastroenteritis, dehydration, electrolyte abnormality, anemia, infectious etiology.  This is not an exhaustive list.  Comorbidities that complicate the patient evaluation See HPI  Social determinants of health NA  Additional history obtained: Additional history obtained from EMR. External records from outside source obtained and review including prior labs  Cardiac monitoring/EKG: The patient was maintained on a cardiac monitor.  I personally reviewed and interpreted the cardiac monitor which showed an underlying rhythm of: Sinus rhythm.  Lab tests: I ordered and personally interpreted labs.  The pertinent results include: WBC unremarkable. Hbg 11.6 lower than baseline.. Platelets unremarkable. No electrolyte abnormalities noted. BUN, creatinine unremarkable. LFT unremarkable. UA significant for glucose more than 1000, positive ketones.  Imaging studies:   Problem list/ ED course/ Critical interventions/ Medical management: HPI: See above Vital signs significant for blood pressure 174/80 otherwise within normal  range and stable throughout visit. Laboratory/imaging studies significant for: See above. On physical examination, patient is afebrile and appears in no acute distress.  Neuro exam unremarkable patient was moving upper and lower extremity bilaterally.  Normal strength throughout.  Cranial nerves II through XII grossly intact.  Patient states he has dizziness following diarrhea.  I suspect his symptoms are due to dehydration.  Patient was given 700 cc LR ICMS.  I ordered a bolus of 500 cc normal saline and will reassess patient.  CBC with anemia 11.5.  Hemoccult negative.  The cause of his anemia is unknown.  Patient's clinical presentations and laboratory/imaging studies are most concerned for dehydration, viral gastroenteritis.  Patient has been seeing a GI doctor who has an extensive work-up with no significant findings.  Patient has felt significant better after IV fluid when I reevaluated him.  Advised patient to follow-up with PCP for reevaluation and management.  Advised patient to return to the emergency department if new or worsening symptoms.. Normal saline ordered. Reevaluation of the patient after these medications showed that the patient improved.   I have reviewed the patient home medicines and have made adjustments as needed.  Consultations obtained: I requested consultation with attending Dr.Butler, and discussed lab and imaging findings as well as pertinent plan.  He agreed with the plan.  Disposition Continued outpatient therapy. Follow-up with PCP  recommended for reevaluation of symptoms. Treatment plan discussed with patient.  Pt acknowledged understanding was agreeable to the plan. Worrisome signs and symptoms were discussed with patient, and patient acknowledged understanding to return to the ED if they noticed these signs and symptoms. Patient was stable upon discharge.   This chart was dictated using voice recognition software.  Despite best efforts to proofread,  errors can  occur which can change the documentation meaning.    {Document critical care time when appropriate:1} {Document review of labs and clinical decision tools ie heart score, Chads2Vasc2 etc:1}  {Document your independent review of radiology images, and any outside records:1} {Document your discussion with family members, caretakers, and with consultants:1} {Document social determinants of health affecting pt's care:1} {Document your decision making why or why not admission, treatments were needed:1} Final Clinical Impression(s) / ED Diagnoses Final diagnoses:  Dizziness  Diarrhea, unspecified type  Nausea and vomiting, unspecified vomiting type    Rx / DC Orders  ED Discharge Orders     None

## 2022-08-24 NOTE — ED Notes (Signed)
Discharge paperwork given and verbally understood. 

## 2022-08-24 NOTE — ED Notes (Signed)
Triage note 

## 2022-08-24 NOTE — ED Triage Notes (Signed)
Pt arrives via EMS from home. Pt has been experiencing n/v/d for the past several months. Symptoms returned again this morning.  EMS reports prolonged QT

## 2022-09-04 ENCOUNTER — Other Ambulatory Visit: Payer: Self-pay | Admitting: Interventional Cardiology

## 2022-09-04 NOTE — Telephone Encounter (Signed)
Prescription refill request for Xarelto received.  Indication:afib Last office visit:2/23 Weight:72.6  kg Age:76 Scr:1.2 CrCl:53.78  ml/min  Prescription refilled

## 2022-09-05 ENCOUNTER — Encounter: Payer: Self-pay | Admitting: Gastroenterology

## 2022-09-05 ENCOUNTER — Ambulatory Visit (INDEPENDENT_AMBULATORY_CARE_PROVIDER_SITE_OTHER): Payer: Medicare HMO | Admitting: Gastroenterology

## 2022-09-05 VITALS — BP 136/62 | HR 57 | Ht 67.0 in | Wt 156.5 lb

## 2022-09-05 DIAGNOSIS — R112 Nausea with vomiting, unspecified: Secondary | ICD-10-CM

## 2022-09-05 DIAGNOSIS — Z9049 Acquired absence of other specified parts of digestive tract: Secondary | ICD-10-CM

## 2022-09-05 DIAGNOSIS — K5900 Constipation, unspecified: Secondary | ICD-10-CM | POA: Diagnosis not present

## 2022-09-05 DIAGNOSIS — Z86010 Personal history of colonic polyps: Secondary | ICD-10-CM | POA: Diagnosis not present
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
## 2022-09-05 NOTE — Patient Instructions (Signed)
Please follow up in 12 months. Give [place] a call at [phone] to schedule an appointment.   If you are age 77 or older, your body mass index should be between 23-30. Your Body mass index is 24.51 kg/m. If this is out of the aforementioned range listed, please consider follow up with your Primary Care Provider.  __________________________________________________________  The [facility] providers would like to encourage you to use [facility] to communicate with providers for non-urgent requests or questions.  Due to long hold times on the telephone, sending your provider a message by [facility] may be a faster and more efficient way to get a response.  Please allow 48 business hours for a response.  Please remember that this is for non-urgent requests.    Due to recent changes in healthcare laws, you may see the results of your imaging and laboratory studies on MyChart before your provider has had a chance to review them.  We understand that in some cases there may be results that are confusing or concerning to you. Not all laboratory results come back in the same time frame and the provider may be waiting for multiple results in order to interpret others.  Please give [place] 48 hours in order for your provider to thoroughly review all the results before contacting the office for clarification of your results.    Thank you for choosing me and [facility].  Vito Cirigliano, D.O.
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
## 2022-09-20 ENCOUNTER — Encounter: Payer: Self-pay | Admitting: Nurse Practitioner

## 2022-09-20 ENCOUNTER — Ambulatory Visit: Payer: Medicare HMO | Attending: Nurse Practitioner | Admitting: Nurse Practitioner

## 2022-09-20 VITALS — BP 138/58 | HR 68 | Ht 66.5 in | Wt 157.6 lb

## 2022-09-20 DIAGNOSIS — I10 Essential (primary) hypertension: Secondary | ICD-10-CM

## 2022-09-20 DIAGNOSIS — G4733 Obstructive sleep apnea (adult) (pediatric): Secondary | ICD-10-CM

## 2022-09-20 DIAGNOSIS — I5022 Chronic systolic (congestive) heart failure: Secondary | ICD-10-CM | POA: Diagnosis not present

## 2022-09-20 DIAGNOSIS — I480 Paroxysmal atrial fibrillation: Secondary | ICD-10-CM

## 2022-09-20 DIAGNOSIS — I25709 Atherosclerosis of coronary artery bypass graft(s), unspecified, with unspecified angina pectoris: Secondary | ICD-10-CM

## 2022-09-20 LAB — HEPATIC FUNCTION PANEL
ALT: 19 IU/L (ref 0–44)
AST: 23 IU/L (ref 0–40)
Albumin: 3.9 g/dL (ref 3.8–4.8)
Alkaline Phosphatase: 54 IU/L (ref 44–121)
Bilirubin Total: 0.7 mg/dL (ref 0.0–1.2)
Bilirubin, Direct: 0.23 mg/dL (ref 0.00–0.40)
Total Protein: 6 g/dL (ref 6.0–8.5)

## 2022-09-20 LAB — TSH: TSH: 1.42 u[IU]/mL (ref 0.450–4.500)

## 2022-09-20 NOTE — Patient Instructions (Signed)
Medication Instructions:  Your physician recommends that you continue on your current medications as directed. Please refer to the Current Medication list given to you today. *If you need a refill on your cardiac medications before your next appointment, please call your pharmacy*   Lab Work: TODAY-LFT & TSH If you have labs (blood work) drawn today and your tests are completely normal, you will receive your results only by: [place] (if you have MyChart) OR A paper copy in the mail If you have any lab test that is abnormal or we need to change your treatment, we will call you to review the results.   Testing/Procedures: NONE ORDERED   Follow-Up: At [facility], you and your health needs are our priority.  As part of our continuing mission to provide you with exceptional heart care, we have created designated Provider Care Teams.  These Care Teams include your primary Cardiologist (physician) and Advanced Practice Providers (APPs -  Physician Assistants and Nurse Practitioners) who all work together to provide you with the care you need, when you need it.  We recommend signing up for the patient portal called "MyChart".  Sign up information is provided on this After Visit Summary.  MyChart is used to connect with patients for Virtual Visits (Telemedicine).  Patients are able to view lab/test results, encounter notes, upcoming appointments, etc.  Non-urgent messages can be sent to your provider as well.   To learn more about what you can do with MyChart, go to [email or web address].    Your next appointment:   9 month(s)  The format for your next appointment:   In Person  Provider:   Ambrose Pancoast, NP or who ever the patient chooses for new provider Other Instructions  Dr Burt Knack Dr Angelena Form Dr Irish Lack Dr Radford Pax  Important Information About Sugar

## 2022-10-12 ENCOUNTER — Other Ambulatory Visit: Payer: Self-pay | Admitting: Interventional Cardiology

## 2022-11-09 ENCOUNTER — Other Ambulatory Visit: Payer: Self-pay

## 2022-11-09 MED ORDER — AMIODARONE HCL 200 MG PO TABS: 200.0000 mg | ORAL_TABLET | Freq: Every day | ORAL | 3 refills | Status: DC

## 2022-11-09 MED ORDER — METOPROLOL SUCCINATE ER 25 MG PO TB24: 25.0000 mg | ORAL_TABLET | Freq: Every day | ORAL | 3 refills | Status: DC

## 2022-12-03 ENCOUNTER — Encounter (HOSPITAL_COMMUNITY): Payer: Self-pay | Admitting: Emergency Medicine

## 2022-12-03 ENCOUNTER — Emergency Department (HOSPITAL_COMMUNITY): Payer: Medicare HMO

## 2022-12-03 ENCOUNTER — Emergency Department (HOSPITAL_COMMUNITY)
Admission: EM | Admit: 2022-12-03 | Discharge: 2022-12-03 | Disposition: A | Discharge status: Home / Self Care | Payer: Medicare HMO | Attending: Emergency Medicine | Admitting: Emergency Medicine

## 2022-12-03 DIAGNOSIS — R519 Headache, unspecified: Secondary | ICD-10-CM | POA: Diagnosis not present

## 2022-12-03 DIAGNOSIS — Z794 Long term (current) use of insulin: Secondary | ICD-10-CM | POA: Insufficient documentation

## 2022-12-03 DIAGNOSIS — Z951 Presence of aortocoronary bypass graft: Secondary | ICD-10-CM | POA: Diagnosis not present

## 2022-12-03 DIAGNOSIS — Z79899 Other long term (current) drug therapy: Secondary | ICD-10-CM | POA: Insufficient documentation

## 2022-12-03 DIAGNOSIS — M62838 Other muscle spasm: Secondary | ICD-10-CM | POA: Diagnosis present

## 2022-12-03 DIAGNOSIS — Z7901 Long term (current) use of anticoagulants: Secondary | ICD-10-CM | POA: Insufficient documentation

## 2022-12-03 DIAGNOSIS — I2510 Atherosclerotic heart disease of native coronary artery without angina pectoris: Secondary | ICD-10-CM | POA: Diagnosis not present

## 2022-12-03 DIAGNOSIS — R778 Other specified abnormalities of plasma proteins: Secondary | ICD-10-CM | POA: Diagnosis not present

## 2022-12-03 LAB — COMPREHENSIVE METABOLIC PANEL
ALT: 16 U/L (ref 0–44)
AST: 20 U/L (ref 15–41)
Albumin: 2.7 g/dL — ABNORMAL LOW (ref 3.5–5.0)
Alkaline Phosphatase: 43 U/L (ref 38–126)
Anion gap: 9 (ref 5–15)
BUN: 18 mg/dL (ref 8–23)
CO2: 24 mmol/L (ref 22–32)
Calcium: 8.4 mg/dL — ABNORMAL LOW (ref 8.9–10.3)
Chloride: 105 mmol/L (ref 98–111)
Creatinine, Ser: 1.41 mg/dL — ABNORMAL HIGH (ref 0.61–1.24)
GFR, Estimated: 51 mL/min — ABNORMAL LOW (ref >60)
Glucose, Bld: 184 mg/dL — ABNORMAL HIGH (ref 70–99)
Potassium: 3.5 mmol/L (ref 3.5–5.1)
Sodium: 138 mmol/L (ref 135–145)
Total Bilirubin: 0.8 mg/dL (ref 0.3–1.2)
Total Protein: 5.6 g/dL — ABNORMAL LOW (ref 6.5–8.1)

## 2022-12-03 LAB — CBC
HCT: 31.2 % — ABNORMAL LOW (ref 39.0–52.0)
Hemoglobin: 10.3 g/dL — ABNORMAL LOW (ref 13.0–17.0)
MCH: 27.7 pg (ref 26.0–34.0)
MCHC: 33 g/dL (ref 30.0–36.0)
MCV: 83.9 fL (ref 80.0–100.0)
Platelets: 226 10*3/uL (ref 150–400)
RBC: 3.72 MIL/uL — ABNORMAL LOW (ref 4.22–5.81)
RDW: 14 % (ref 11.5–15.5)
WBC: 5.3 10*3/uL (ref 4.0–10.5)
nRBC: 0 % (ref 0.0–0.2)

## 2022-12-03 LAB — TROPONIN I (HIGH SENSITIVITY)
Troponin I (High Sensitivity): 67 ng/L — ABNORMAL HIGH (ref <18)
Troponin I (High Sensitivity): 47 ng/L — ABNORMAL HIGH (ref <18)

## 2022-12-03 LAB — MAGNESIUM: Magnesium: 2 mg/dL (ref 1.7–2.4)

## 2022-12-03 MED ORDER — LACTATED RINGERS IV BOLUS
1000.0000 mL | Freq: Once | INTRAVENOUS | Status: AC
  Administered 2022-12-03: 1000 mL via INTRAVENOUS

## 2022-12-03 NOTE — ED Provider Notes (Signed)
[facility] Provider Note   CSN: [id number] Arrival date & time: 12/03/22  0015     History {Add pertinent medical, surgical, social history, OB history to HPI:1} Chief Complaint  Patient presents with   Spasms    Walter [name] is a 77 y.o. male.  HPI     Home Medications Prior to Admission medications   Medication Sig Start Date End Date Taking? Authorizing Provider  amiodarone (PACERONE) 200 MG tablet Take 1 tablet (200 mg total) by mouth daily. 11/09/22   Marylu Lund., NP  buPROPion (WELLBUTRIN XL) 150 MG 24 hr tablet Take 150 mg by mouth daily.    [provider]  FARXIGA 10 MG TABS tablet Take 10 mg by mouth daily. 09/10/22   [provider]  fesoterodine (TOVIAZ) 8 MG TB24 tablet Take 8 mg by mouth daily. 05/24/21   [provider]  insulin glargine (LANTUS) 100 UNIT/ML injection Inject 10 Units into the skin daily.    [provider]  levothyroxine (SYNTHROID) 88 MCG tablet Take 88 mcg by mouth every morning. 10/30/21   [provider]  lubiprostone (AMITIZA) 24 MCG capsule Take 1 capsule (24 mcg total) by mouth 2 (two) times daily with a meal. 07/03/22   Cirigliano, Vito V, DO  metoprolol succinate (TOPROL-XL) 25 MG 24 hr tablet Take 1 tablet (25 mg total) by mouth daily. 11/09/22   Marylu Lund., NP  Omega-3-acid Ethyl Esters (LOVAZA PO) Take 1 tablet by mouth 2 (two) times daily.    [provider]  Rivaroxaban (XARELTO) 15 MG TABS tablet TAKE 1 TABLET (15 MG TOTAL) BY MOUTH DAILY WITH SUPPER 09/04/22   Belva Crome, MD  rosuvastatin (CRESTOR) 10 MG tablet Take 10 mg by mouth at bedtime.    [provider]  tamsulosin (FLOMAX) 0.4 MG CAPS capsule Take 0.8 mg by mouth every other day.    [provider]  TRULICITY 1.5 0000000 SOPN Inject 1.5 mg into the skin once a week. Patient not taking: Reported on 09/20/2022 06/19/21   [provider]       Allergies    Tape, Aspirin, Byetta 10 mcg pen [exenatide], Invokana [canagliflozin], Levemir [insulin detemir], Nsaids, and Short ragweed pollen ext    Review of Systems   Review of Systems  Physical Exam Updated Vital Signs BP 119/60 (BP Location: Right Arm)   Pulse 66   Temp 97.9 F (36.6 C) (Oral)   Resp 18   SpO2 100%  Physical Exam  ED Results / Procedures / Treatments   Labs (all labs ordered are listed, but only abnormal results are displayed) Labs Reviewed  COMPREHENSIVE METABOLIC PANEL - Abnormal; Notable for the following components:      Result Value   Glucose, Bld 184 (*)    Creatinine, Ser 1.41 (*)    Calcium 8.4 (*)    Total Protein 5.6 (*)    Albumin 2.7 (*)    GFR, Estimated 51 (*)    All other components within normal limits  CBC - Abnormal; Notable for the following components:   RBC 3.72 (*)    Hemoglobin 10.3 (*)    HCT 31.2 (*)    All other components within normal limits  TROPONIN I (HIGH SENSITIVITY) - Abnormal; Notable for the following components:   Troponin I (High Sensitivity) 67 (*)    All other components within normal limits  MAGNESIUM    EKG None  Radiology  CT Head Wo Contrast  Result Date: 12/03/2022 CLINICAL DATA:  Headache. EXAM: CT HEAD WITHOUT CONTRAST TECHNIQUE: Contiguous axial images were obtained from the base of the skull through the vertex without intravenous contrast. RADIATION DOSE REDUCTION: This exam was performed according to the departmental dose-optimization program which includes automated exposure control, adjustment of the mA and/or kV according to patient size and/or use of iterative reconstruction technique. COMPARISON:  December 30, 2014 FINDINGS: Brain: There is mild cerebral atrophy with widening of the extra-axial spaces and ventricular dilatation. There are areas of decreased attenuation within the white matter tracts of the supratentorial brain, consistent with microvascular disease changes. Vascular: No  hyperdense vessel or unexpected calcification. Skull: Normal. Negative for fracture or focal lesion. Sinuses/Orbits: No acute finding. Other: None. IMPRESSION: 1. No acute intracranial abnormality. 2. Generalized cerebral atrophy and microvascular disease changes of the supratentorial brain. Electronically Signed   By: Virgina Norfolk M.D.   On: 12/03/2022 02:18   DG Chest 2 View  Result Date: 12/03/2022 CLINICAL DATA:  Tremors. EXAM: CHEST - 2 VIEW COMPARISON:  Chest CT without contrast 06/22/2022 FINDINGS: Cardiac size is normal. There are old CABG changes. There is aortic atherosclerosis. Stable mediastinum. Emphysematous and chronic changes of the lungs are again noted with old left lateral basal pleuroparenchymal disease. No focal pneumonia is evident.  No pleural effusion. Osteopenia and bridging enthesopathy thoracic spine. IMPRESSION: 1. Chronic changes without evidence of acute chest process. Stable COPD chest. 2. Aortic atherosclerosis. 3. Osteopenia and bridging enthesopathy thoracic spine. Electronically Signed   By: Telford Nab M.D.   On: 12/03/2022 02:06    Procedures Procedures  {Document cardiac monitor, telemetry assessment procedure when appropriate:1}  Medications Ordered in ED Medications  lactated ringers bolus 1,000 mL (1,000 mLs Intravenous New Bag/Given 12/03/22 0230)    ED Course/ Medical Decision Making/ A&P   {   Click here for ABCD2, HEART and other calculatorsREFRESH Note before signing :1}                          Medical Decision Making Amount and/or Complexity of Data Reviewed Labs: ordered. Radiology: ordered.   ***  {Document critical care time when appropriate:1} {Document review of labs and clinical decision tools ie heart score, Chads2Vasc2 etc:1}  {Document your independent review of radiology images, and any outside records:1} {Document your discussion with family members, caretakers, and with consultants:1} {Document social determinants of  health affecting pt's care:1} {Document your decision making why or why not admission, treatments were needed:1} Final Clinical Impression(s) / ED Diagnoses Final diagnoses:  None    Rx / DC Orders ED Discharge Orders     None

## 2022-12-03 NOTE — ED Triage Notes (Signed)
Per EMS, pt from home, c/o that he started having muscle spasms bilateral extremities.  When EMS arrived they noted tremors to hands but has since resolved.  No other neuro deficits, A/O X4, equal strength, no droop, good speech.  Pt has not had any falls but is on blood thinners.  129/66 HR 65 99%RA CBG 191  20G L AC  Pt continues to state that all symptoms have resolved, no neuro deficits noted at this time.

## 2022-12-20 ENCOUNTER — Other Ambulatory Visit: Payer: Self-pay | Admitting: Pharmacist Clinician (PhC)/ Clinical Pharmacy Specialist

## 2022-12-20 MED ORDER — RIVAROXABAN 15 MG PO TABS: ORAL_TABLET | ORAL | 1 refills | Status: DC

## 2022-12-20 NOTE — Telephone Encounter (Signed)
Prescription refill request for Xarelto received.  Indication: AF Last office visit: 12/23 Weight:  71.5kg Age: 77 Scr:  1.41 CrCl:  44

## 2022-12-25 ENCOUNTER — Encounter: Payer: Self-pay | Admitting: Pulmonary Disease

## 2022-12-25 ENCOUNTER — Ambulatory Visit (INDEPENDENT_AMBULATORY_CARE_PROVIDER_SITE_OTHER): Payer: Medicare HMO | Admitting: Pulmonary Disease

## 2022-12-25 VITALS — BP 114/62 | HR 74 | Temp 97.8°F | Ht 66.0 in | Wt 154.4 lb

## 2022-12-25 DIAGNOSIS — J849 Interstitial pulmonary disease, unspecified: Secondary | ICD-10-CM

## 2022-12-25 NOTE — Patient Instructions (Signed)
I am glad you are doing well with the breathing Will get high-resolution CT chest and PFTs in 6 months Return to clinic in 6 months after test.

## 2022-12-25 NOTE — Addendum Note (Signed)
Addended by: Elton Sin on: 12/25/2022 02:17 PM   Modules accepted: Orders

## 2022-12-25 NOTE — Progress Notes (Signed)
Walter [name]    [id number]    [birth year]  Primary Care Physician:Griffin, Jenny Reichmann, MD  Referring Physician: Lavone Orn, MD [place] [employer] [address],  [place] [id number]  Chief complaint: Follow-up for interstitial lung disease  HPI: 77 y.o. who  has a past medical history of Abdominal pain, Atrial fibrillation ([place]), Barrett's esophagus, CAD (coronary artery disease), Cancer ([place]), CHF (congestive heart failure) ([place]), Chronic kidney disease, Colon polyp, CVA (cerebral vascular accident) ([place]) (12/2014), Depression, Diabetes mellitus, Gum disease, Hiatal hernia, HLD (hyperlipidemia), Hypertension, Kidney stone, Left foot drop, Numbness, OSA on CPAP, Paresthesia of bilateral legs, Pleural effusion, Pneumonia, Prostate cancer ([place]) (2007), PTSD (post-traumatic stress disorder), Stroke ([place]), and Vertigo.   Referred for evaluation of interstitial lung disease noted on recent screening CT of the chest.  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  He made it out of the building but had significant exposure to inhaled toxins after collapse of the towers.  No ongoing exposures.  No mold, hot tub, Jacuzzi.  No feather pillows or comforters Smoking history: 40-pack-year smoker.  Quit in 2008 Travel history: Previously lived in [place] and [place].  Moved to [place] in 2008.  No significant recent travel Relevant family history: No family history of lung disease.  Interim history: Breathing is doing well.  No new issues today  Outpatient Encounter Medications as of 12/25/2022  Medication Sig   amiodarone (PACERONE) 200 MG tablet Take 1 tablet (200 mg total) by mouth daily.   buPROPion (WELLBUTRIN XL)  150 MG 24 hr tablet Take 150 mg by mouth daily.   FARXIGA 10 MG TABS tablet Take 10 mg by mouth daily.   fesoterodine (TOVIAZ) 8 MG TB24 tablet Take 8 mg by mouth daily.   insulin glargine (LANTUS) 100 UNIT/ML injection Inject 10 Units into the skin daily.   levothyroxine (SYNTHROID) 88 MCG tablet Take 88 mcg by mouth every morning.   lubiprostone (AMITIZA) 24 MCG capsule Take 1 capsule (24 mcg total) by mouth 2 (two) times daily with a meal.   metoprolol succinate (TOPROL-XL) 25 MG 24 hr tablet Take 1 tablet (25 mg total) by mouth daily.   Omega-3-acid Ethyl Esters (LOVAZA PO) Take 1 tablet by mouth 2 (two) times daily.   Rivaroxaban (XARELTO) 15 MG TABS tablet TAKE 1 TABLET (15 MG TOTAL) BY MOUTH DAILY WITH SUPPER   rosuvastatin (CRESTOR) 10 MG tablet Take 10 mg by mouth at bedtime.   tamsulosin (FLOMAX) 0.4 MG CAPS capsule Take 0.8 mg by mouth every other day.   TRULICITY 1.5 0000000 SOPN Inject 1.5 mg into the skin once a week.   No facility-administered encounter medications on file as of 12/25/2022.   Physical Exam: Blood pressure 114/62, pulse 74, temperature 97.8 F (36.6 C), temperature source Oral, height 5\' 6"  (1.676 m), weight 154 lb 6.4 oz (70 kg), SpO2 97 %. Gen:      No acute distress HEENT:  EOMI, sclera anicteric Neck:     No masses; no thyromegaly Lungs:    Clear to auscultation bilaterally; normal respiratory effort CV:         Regular rate and rhythm; no murmurs Abd:      +  bowel sounds; soft, non-tender; no palpable masses, no distension Ext:    No edema; adequate peripheral perfusion Skin:      Warm and dry; no rash Neuro: alert and oriented x 3 Psych: normal mood and affect   Data Reviewed: Imaging: Lung cancer screening CT 03/21/2022-mild bronchial wall thickening with emphysema mild groundglass attenuation with subpleural reticulation, bronchiectasis at the lung base bilaterally.  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  He does have significant exposure from [facility] collapse on 9/11 and history of hemothorax on the left the changes may be related to that.  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  Rheumatoid factor is elevated but he does not have joint symptoms.  He is also on amiodarone but CT scan is not typical  Discussed further work-up, possible etiology in detail with him in clinic.  He is interested in conservative management and does not want any aggressive work-up or treatment at present Order high resolution CT and PFTs in 6 months  Plan/Recommendations: Follow High res CT and PFTs in 6 months  Marshell Garfinkel MD  Pulmonary and Critical Care 12/25/2022, 1:43 PM  CC: Lavone Orn, MD

## 2023-03-09 ENCOUNTER — Other Ambulatory Visit: Payer: Self-pay | Admitting: Nurse Practitioner

## 2023-03-22 ENCOUNTER — Emergency Department (HOSPITAL_BASED_OUTPATIENT_CLINIC_OR_DEPARTMENT_OTHER): Payer: Medicare HMO

## 2023-03-22 ENCOUNTER — Encounter (HOSPITAL_BASED_OUTPATIENT_CLINIC_OR_DEPARTMENT_OTHER): Payer: Self-pay

## 2023-03-22 ENCOUNTER — Emergency Department (HOSPITAL_BASED_OUTPATIENT_CLINIC_OR_DEPARTMENT_OTHER)
Admission: EM | Admit: 2023-03-22 | Discharge: 2023-03-22 | Disposition: A | Discharge status: Home / Self Care | Payer: Medicare HMO | Attending: Emergency Medicine | Admitting: Emergency Medicine

## 2023-03-22 ENCOUNTER — Other Ambulatory Visit: Payer: Self-pay

## 2023-03-22 DIAGNOSIS — E860 Dehydration: Secondary | ICD-10-CM | POA: Diagnosis not present

## 2023-03-22 DIAGNOSIS — I2510 Atherosclerotic heart disease of native coronary artery without angina pectoris: Secondary | ICD-10-CM | POA: Diagnosis not present

## 2023-03-22 DIAGNOSIS — Z1152 Encounter for screening for COVID-19: Secondary | ICD-10-CM | POA: Diagnosis not present

## 2023-03-22 DIAGNOSIS — Z79899 Other long term (current) drug therapy: Secondary | ICD-10-CM | POA: Diagnosis not present

## 2023-03-22 DIAGNOSIS — E1122 Type 2 diabetes mellitus with diabetic chronic kidney disease: Secondary | ICD-10-CM | POA: Diagnosis not present

## 2023-03-22 DIAGNOSIS — R112 Nausea with vomiting, unspecified: Secondary | ICD-10-CM

## 2023-03-22 DIAGNOSIS — I509 Heart failure, unspecified: Secondary | ICD-10-CM | POA: Diagnosis not present

## 2023-03-22 DIAGNOSIS — Z7901 Long term (current) use of anticoagulants: Secondary | ICD-10-CM | POA: Diagnosis not present

## 2023-03-22 DIAGNOSIS — I130 Hypertensive heart and chronic kidney disease with heart failure and stage 1 through stage 4 chronic kidney disease, or unspecified chronic kidney disease: Secondary | ICD-10-CM | POA: Insufficient documentation

## 2023-03-22 DIAGNOSIS — N189 Chronic kidney disease, unspecified: Secondary | ICD-10-CM | POA: Insufficient documentation

## 2023-03-22 DIAGNOSIS — R519 Headache, unspecified: Secondary | ICD-10-CM | POA: Diagnosis present

## 2023-03-22 DIAGNOSIS — Z794 Long term (current) use of insulin: Secondary | ICD-10-CM | POA: Diagnosis not present

## 2023-03-22 DIAGNOSIS — Z8673 Personal history of transient ischemic attack (TIA), and cerebral infarction without residual deficits: Secondary | ICD-10-CM | POA: Diagnosis not present

## 2023-03-22 DIAGNOSIS — Z8546 Personal history of malignant neoplasm of prostate: Secondary | ICD-10-CM | POA: Diagnosis not present

## 2023-03-22 LAB — URINALYSIS, ROUTINE W REFLEX MICROSCOPIC
Bacteria, UA: NONE SEEN
Bilirubin Urine: NEGATIVE
Glucose, UA: >1000 mg/dL — AB
Hgb urine dipstick: NEGATIVE
Ketones, ur: NEGATIVE mg/dL
Leukocytes,Ua: NEGATIVE
Nitrite: NEGATIVE
Specific Gravity, Urine: 1.02 (ref 1.005–1.030)
pH: 7 (ref 5.0–8.0)

## 2023-03-22 LAB — CBC WITH DIFFERENTIAL/PLATELET
Abs Immature Granulocytes: 0.01 10*3/uL (ref 0.00–0.07)
Basophils Absolute: 0 10*3/uL (ref 0.0–0.1)
Basophils Relative: 0 %
Eosinophils Absolute: 0.1 10*3/uL (ref 0.0–0.5)
Eosinophils Relative: 2 %
HCT: 31.8 % — ABNORMAL LOW (ref 39.0–52.0)
Hemoglobin: 10.1 g/dL — ABNORMAL LOW (ref 13.0–17.0)
Immature Granulocytes: 0 %
Lymphocytes Relative: 30 %
Lymphs Abs: 1.5 10*3/uL (ref 0.7–4.0)
MCH: 25.2 pg — ABNORMAL LOW (ref 26.0–34.0)
MCHC: 31.8 g/dL (ref 30.0–36.0)
MCV: 79.3 fL — ABNORMAL LOW (ref 80.0–100.0)
Monocytes Absolute: 0.6 10*3/uL (ref 0.1–1.0)
Monocytes Relative: 11 %
Neutro Abs: 2.9 10*3/uL (ref 1.7–7.7)
Neutrophils Relative %: 57 %
Platelets: 299 10*3/uL (ref 150–400)
RBC: 4.01 MIL/uL — ABNORMAL LOW (ref 4.22–5.81)
RDW: 15.9 % — ABNORMAL HIGH (ref 11.5–15.5)
WBC: 5.1 10*3/uL (ref 4.0–10.5)
nRBC: 0 % (ref 0.0–0.2)

## 2023-03-22 LAB — COMPREHENSIVE METABOLIC PANEL
ALT: 13 U/L (ref 0–44)
AST: 18 U/L (ref 15–41)
Albumin: 3.9 g/dL (ref 3.5–5.0)
Alkaline Phosphatase: 39 U/L (ref 38–126)
Anion gap: 10 (ref 5–15)
BUN: 23 mg/dL (ref 8–23)
CO2: 26 mmol/L (ref 22–32)
Calcium: 9.4 mg/dL (ref 8.9–10.3)
Chloride: 105 mmol/L (ref 98–111)
Creatinine, Ser: 1.55 mg/dL — ABNORMAL HIGH (ref 0.61–1.24)
GFR, Estimated: 46 mL/min — ABNORMAL LOW (ref >60)
Glucose, Bld: 138 mg/dL — ABNORMAL HIGH (ref 70–99)
Potassium: 4 mmol/L (ref 3.5–5.1)
Sodium: 141 mmol/L (ref 135–145)
Total Bilirubin: 0.7 mg/dL (ref 0.3–1.2)
Total Protein: 6.4 g/dL — ABNORMAL LOW (ref 6.5–8.1)

## 2023-03-22 LAB — LIPASE, BLOOD: Lipase: 16 U/L (ref 11–51)

## 2023-03-22 LAB — SARS CORONAVIRUS 2 BY RT PCR: SARS Coronavirus 2 by RT PCR: NEGATIVE

## 2023-03-22 LAB — CBG MONITORING, ED: Glucose-Capillary: 130 mg/dL — ABNORMAL HIGH (ref 70–99)

## 2023-03-22 MED ORDER — MORPHINE SULFATE (PF) 4 MG/ML IV SOLN
4.0000 mg | Freq: Once | INTRAVENOUS | Status: AC
  Administered 2023-03-22: 4 mg via INTRAVENOUS
  Filled 2023-03-22: qty 1

## 2023-03-22 MED ORDER — HYDROCODONE-ACETAMINOPHEN 5-325 MG PO TABS: 1.0000 | ORAL_TABLET | ORAL | 0 refills | Status: DC | PRN

## 2023-03-22 MED ORDER — ONDANSETRON 4 MG PO TBDP: 4.0000 mg | ORAL_TABLET | Freq: Three times a day (TID) | ORAL | 0 refills | Status: DC | PRN

## 2023-03-22 MED ORDER — SODIUM CHLORIDE 0.9 % IV BOLUS
1000.0000 mL | Freq: Once | INTRAVENOUS | Status: AC
  Administered 2023-03-22: 1000 mL via INTRAVENOUS

## 2023-03-22 MED ORDER — ONDANSETRON HCL 4 MG/2ML IJ SOLN
4.0000 mg | Freq: Once | INTRAMUSCULAR | Status: AC
  Administered 2023-03-22: 4 mg via INTRAVENOUS
  Filled 2023-03-22: qty 2

## 2023-03-22 NOTE — ED Notes (Signed)
Blood sent to lab

## 2023-03-22 NOTE — ED Notes (Signed)
ED Provider at bedside. 

## 2023-03-22 NOTE — ED Triage Notes (Signed)
Pt c/o HA, NVD x2hrs, sudden onset. Associated abd pain. No meds PTA. Hx DM, states he's compliant w regimen, "at goal w A1C."

## 2023-03-22 NOTE — ED Provider Notes (Signed)
[place] EMERGENCY DEPARTMENT AT [facility] Provider Note   CSN: [id number] Arrival date & time: 03/22/23  1331     History  Chief Complaint  Patient presents with   Headache   Nausea   Emesis   Diarrhea    Walter [name] is a 77 y.o. male.  Pt is a 77 yo male with pmhx significant for ckd, dm, prostate cancer, cva, htn, cad, afib (on Xarelto), ptsd, chf, depression, and hld.  Pt said has developed a severe h/a with n/v/d for 2 hrs ago.  He denies f/c.  He's had some sinus issues.  No abd pain.  He took his am meds, but is not sure if they stayed down.       Home Medications Prior to Admission medications   Medication Sig Start Date End Date Taking? Authorizing Provider  amiodarone (PACERONE) 200 MG tablet Take 1 tablet (200 mg total) by mouth daily. 11/09/22   Gaston Islam., NP  buPROPion (WELLBUTRIN XL) 150 MG 24 hr tablet Take 150 mg by mouth daily.    [provider]  FARXIGA 10 MG TABS tablet Take 10 mg by mouth daily. 09/10/22   [provider]  fesoterodine (TOVIAZ) 8 MG TB24 tablet Take 8 mg by mouth daily. 05/24/21   [provider]  insulin glargine (LANTUS) 100 UNIT/ML injection Inject 10 Units into the skin daily.    [provider]  levothyroxine (SYNTHROID) 88 MCG tablet Take 88 mcg by mouth every morning. 10/30/21   [provider]  lubiprostone (AMITIZA) 24 MCG capsule Take 1 capsule (24 mcg total) by mouth 2 (two) times daily with a meal. 07/03/22   Cirigliano, Vito V, DO  metoprolol succinate (TOPROL-XL) 25 MG 24 hr tablet TAKE 1 TABLET (25 MG TOTAL) BY MOUTH DAILY. 03/11/23   Gaston Islam., NP  Omega-3-acid Ethyl Esters (LOVAZA PO) Take 1 tablet by mouth 2 (two) times daily.    [provider]  Rivaroxaban (XARELTO) 15 MG TABS tablet TAKE 1 TABLET (15 MG TOTAL) BY MOUTH DAILY WITH SUPPER 12/20/22   Gaston Islam., NP  rosuvastatin (CRESTOR) 10 MG tablet Take 10 mg by mouth at bedtime.     [provider]  tamsulosin (FLOMAX) 0.4 MG CAPS capsule Take 0.8 mg by mouth every other day.    [provider]  TRULICITY 1.5 MG/0.5ML SOPN Inject 1.5 mg into the skin once a week. 06/19/21   [provider]      Allergies    Tape, Aspirin, Byetta 10 mcg pen [exenatide], Invokana [canagliflozin], Levemir [insulin detemir], Nsaids, and Short ragweed pollen ext    Review of Systems   Review of Systems  Gastrointestinal:  Positive for nausea and vomiting.  Neurological:  Positive for headaches.  All other systems reviewed and are negative.   Physical Exam Updated Vital Signs BP (!) 153/69   Pulse 62   Temp 98.2 F (36.8 C)   Resp 16   SpO2 100%  Physical Exam Vitals and nursing note reviewed.  Constitutional:      Appearance: He is well-developed.  HENT:     Head: Normocephalic and atraumatic.     Mouth/Throat:     Mouth: Mucous membranes are moist.     Pharynx: Oropharynx is clear.  Eyes:     Extraocular Movements: Extraocular movements intact.     Pupils: Pupils are equal, round, and reactive to light.  Cardiovascular:     Rate and Rhythm:  Normal rate and regular rhythm.     Heart sounds: Normal heart sounds.  Pulmonary:     Effort: Pulmonary effort is normal.     Breath sounds: Normal breath sounds.  Abdominal:     General: Bowel sounds are normal.     Palpations: Abdomen is soft.  Musculoskeletal:        General: Normal range of motion.     Cervical back: Normal range of motion and neck supple.  Skin:    General: Skin is warm.     Capillary Refill: Capillary refill takes less than 2 seconds.  Neurological:     Mental Status: He is alert and oriented to person, place, and time.  Psychiatric:        Mood and Affect: Mood normal.        Speech: Speech normal.        Behavior: Behavior normal.     ED Results / Procedures / Treatments   Labs (all labs ordered are listed, but only abnormal results are displayed) Labs Reviewed  CBC  WITH DIFFERENTIAL/PLATELET - Abnormal; Notable for the following components:      Result Value   RBC 4.01 (*)    Hemoglobin 10.1 (*)    HCT 31.8 (*)    MCV 79.3 (*)    MCH 25.2 (*)    RDW 15.9 (*)    All other components within normal limits  COMPREHENSIVE METABOLIC PANEL - Abnormal; Notable for the following components:   Glucose, Bld 138 (*)    Creatinine, Ser 1.55 (*)    Total Protein 6.4 (*)    GFR, Estimated 46 (*)    All other components within normal limits  CBG MONITORING, ED - Abnormal; Notable for the following components:   Glucose-Capillary 130 (*)    All other components within normal limits  SARS CORONAVIRUS 2 BY RT PCR  LIPASE, BLOOD  URINALYSIS, ROUTINE W REFLEX MICROSCOPIC    EKG None  Radiology CT Head Wo Contrast  Result Date: 03/22/2023 CLINICAL DATA:  Headache, new onset. EXAM: CT HEAD WITHOUT CONTRAST TECHNIQUE: Contiguous axial images were obtained from the base of the skull through the vertex without intravenous contrast. RADIATION DOSE REDUCTION: This exam was performed according to the departmental dose-optimization program which includes automated exposure control, adjustment of the mA and/or kV according to patient size and/or use of iterative reconstruction technique. COMPARISON:  Head CT 12/03/2022. FINDINGS: Brain: No acute hemorrhage. Unchanged chronic small-vessel disease. Cortical gray-white differentiation is otherwise preserved. Prominence of the ventricles and sulci within expected range for age. No hydrocephalus or extra-axial collection. No mass effect or midline shift. Vascular: No hyperdense vessel or unexpected calcification. Skull: No calvarial fracture or suspicious bone lesion. Skull base is unremarkable. Sinuses/Orbits: Partial opacification of the ethmoid air cells. Orbits are unremarkable. Other: None. IMPRESSION: 1. No acute intracranial abnormality. 2. Unchanged chronic small-vessel disease. Electronically Signed   By: Orvan Falconer  M.D.   On: 03/22/2023 14:22    Procedures Procedures    Medications Ordered in ED Medications  morphine (PF) 4 MG/ML injection 4 mg (4 mg Intravenous Given 03/22/23 1359)  ondansetron (ZOFRAN) injection 4 mg (4 mg Intravenous Given 03/22/23 1359)  sodium chloride 0.9 % bolus 1,000 mL (1,000 mLs Intravenous New Bag/Given 03/22/23 1358)    ED Course/ Medical Decision Making/ A&P  Medical Decision Making Amount and/or Complexity of Data Reviewed Labs: ordered. Radiology: ordered.  Risk Prescription drug management.   This patient presents to the ED for concern of n/v and headaches, this involves an extensive number of treatment options, and is a complaint that carries with it a high risk of complications and morbidity.  The differential diagnosis includes ich, migraine, electrolyte abn, covid   Co morbidities that complicate the patient evaluation  ckd, dm, prostate cancer, cva, htn, cad, afib (on Xarelto), ptsd, chf, depression, and hld   Additional history obtained:  Additional history obtained from epic chart review External records from outside source obtained and reviewed including wife   Lab Tests:  I Ordered, and personally interpreted labs.  The pertinent results include:  covid neg, cbc with hgb 10.1 (chronic), lip nl, cmp nl other than cr 1.55 (chronic)   Imaging Studies ordered:  I ordered imaging studies including ct head  I independently visualized and interpreted imaging which showed  No acute intracranial abnormality.  2. Unchanged chronic small-vessel disease.   I agree with the radiologist interpretation   Cardiac Monitoring:  The patient was maintained on a cardiac monitor.  I personally viewed and interpreted the cardiac monitored which showed an underlying rhythm of: nsr   Medicines ordered and prescription drug management:  I ordered medication including ivfs, morphine, zofran  for sx  Reevaluation of the patient  after these medicines showed that the patient improved I have reviewed the patients home medicines and have made adjustments as needed   Test Considered:  ct   Critical Interventions:  Pain/nausea control   Problem List / ED Course:  Headache:  Pt feels much better after tx.  Ct nl.  Labs nl.  Pt is able to keep down fluids.  Return if worse.  F/u with pcp.   Reevaluation:  After the interventions noted above, I reevaluated the patient and found that they have :improved   Social Determinants of Health:  Lives at home   Dispostion:  After consideration of the diagnostic results and the patients response to treatment, I feel that the patent would benefit from discharge with outpatient f/u.          Final Clinical Impression(s) / ED Diagnoses Final diagnoses:  Acute nonintractable headache, unspecified headache type  Nausea and vomiting, unspecified vomiting type  Dehydration    Rx / DC Orders ED Discharge Orders     None         Jacalyn Lefevre, MD 03/22/23 [phone]

## 2023-03-22 NOTE — ED Notes (Signed)
Returned from CT scan.

## 2023-03-22 NOTE — ED Notes (Signed)
Patient verbalizes understanding of discharge instructions. Opportunity for questioning and answers were provided. Patient discharged from ED.  °

## 2023-05-13 ENCOUNTER — Ambulatory Visit: Payer: Medicare HMO | Admitting: Family

## 2023-05-13 ENCOUNTER — Encounter: Payer: Self-pay | Admitting: Family

## 2023-05-13 VITALS — BP 122/88 | HR 61 | Temp 98.6°F | Resp 18 | Ht 66.0 in | Wt 151.8 lb

## 2023-05-13 DIAGNOSIS — G4733 Obstructive sleep apnea (adult) (pediatric): Secondary | ICD-10-CM | POA: Diagnosis not present

## 2023-05-13 DIAGNOSIS — J849 Interstitial pulmonary disease, unspecified: Secondary | ICD-10-CM | POA: Insufficient documentation

## 2023-05-13 DIAGNOSIS — E1151 Type 2 diabetes mellitus with diabetic peripheral angiopathy without gangrene: Secondary | ICD-10-CM

## 2023-05-13 DIAGNOSIS — F321 Major depressive disorder, single episode, moderate: Secondary | ICD-10-CM | POA: Insufficient documentation

## 2023-05-13 DIAGNOSIS — I480 Paroxysmal atrial fibrillation: Secondary | ICD-10-CM | POA: Diagnosis not present

## 2023-05-13 DIAGNOSIS — Z794 Long term (current) use of insulin: Secondary | ICD-10-CM

## 2023-05-13 DIAGNOSIS — E039 Hypothyroidism, unspecified: Secondary | ICD-10-CM | POA: Insufficient documentation

## 2023-05-13 DIAGNOSIS — I25709 Atherosclerosis of coronary artery bypass graft(s), unspecified, with unspecified angina pectoris: Secondary | ICD-10-CM

## 2023-05-13 DIAGNOSIS — R140 Abdominal distension (gaseous): Secondary | ICD-10-CM

## 2023-05-13 DIAGNOSIS — I5022 Chronic systolic (congestive) heart failure: Secondary | ICD-10-CM

## 2023-05-13 DIAGNOSIS — E782 Mixed hyperlipidemia: Secondary | ICD-10-CM

## 2023-05-13 DIAGNOSIS — K219 Gastro-esophageal reflux disease without esophagitis: Secondary | ICD-10-CM | POA: Insufficient documentation

## 2023-05-13 DIAGNOSIS — Z7689 Persons encountering health services in other specified circumstances: Secondary | ICD-10-CM

## 2023-05-13 DIAGNOSIS — R634 Abnormal weight loss: Secondary | ICD-10-CM | POA: Insufficient documentation

## 2023-05-13 NOTE — Progress Notes (Incomplete)
Provider: Richarda Blade FNP-C   , Donalee Citrin, NP  Patient Care Team: , Donalee Citrin, NP as PCP - General (Family Medicine) Lyn Records, MD (Inactive) as PCP - Cardiology (Cardiology)  Extended Emergency Contact Information Primary Emergency Contact: Gallentine,Leta Address: [address]          [place], [place] [address] [place] of [place] Home Phone: [phone] Mobile Phone: [phone] Relation: Spouse Secondary Emergency Contact: Rachael Fee Home Phone: [phone] Relation: Daughter  Code Status:  Full Code  Goals of care: Advanced Directive information    08/24/2022    1:23 PM  Advanced Directives  Does Patient Have a Medical Advance Directive? Yes  Type of Estate agent of [place];Living will     Chief Complaint  Patient presents with  . Establish Care    New patient to establish care    HPI:  Pt is a 77 y.o. male seen today establish care here at [facility] for medical management of chronic diseases.  He is here with wife who provides additional HPI information.  He states moved from from [place] to [place] in 2008 now lives in independent living at [facility] in [place].    He has a medical history of  HTN, coronary artery disease s/p CABG 2004, interstitial lung disease, hypothyroidism, type 2 diabetes with neuropathy, hyperlipidemia, A-fib, GERD, Barrett's esophagus, chronic anticoagulation, congestive heart failure, chronic kidney disease, celiprolol vascular disease, major depression, obstructive sleep apnea, paresthesia of bilateral legs, prostate cancer 2007, history of right hemicolectomy, history of colonic polyps, constipation, PTSD, left foot drop among other conditions.  Feels like food is coming up,feeling bloating.Unable to eat big meals. Has a full sensation.  Also associated with.'s of constipation for about 5 days followed by 1 day or 2 of diarrhea then back  to constipation.  He follows up with gastroenterology Dr.Cirigliano last seen 09/05/2022 for colonoscopy has had benign colonic polyps removed.  Reports no blood in the stool.  Had a colonoscopy 06/15/2022. States symptoms have improved since he was started on Amitiza and has been ordering 60 billion probiotic with prebiotic blend which seems to have helped with his symptoms.Has had progressive weight loss from 205 lbs over 1 year ago down to 151.8 pounds.  Also follows up with cardiologist Robin Searing H.JR, NP last seen 09/20/2022 for A-fib and congestive heart failure.  He denies any cough, wheezing, or shortness of breath.  Has lower extremity edema but stated has improved.  Interstitial lung disease -follows up with pulmonologist Dr.Praveen Mannam last seen 12/25/2022.  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   CR 1.55 previous 1.41   Hgb 10.1   Type 2 DM - No home CBG for review.states had A1C done a [place] 7.0  Follows up with ophthalmology Dr. Mayford Knife for annual eye exam.  He does notes follow-up with a podiatrist for annual foot exam but goes to nail salon.  Major depression - states lost his doctor and his house in [place] upon moving to [place] in 2008.Has never gotten over it.Has been thinking more about himself and his health lately than he has ever before.He  would like a PCP who deals with older people like him that's why he is here today.   He is a former cigarette smoker 57 pack/year he quit in 2008  Will obtain medical records for evaluation of immunization and health screening.   Past Medical History:  Diagnosis Date  . Abdominal pain   . Atrial fibrillation (HCC)   .  Barrett's esophagus   . CAD (coronary artery disease)   . Cancer [facility])    prostate  . CHF (congestive heart failure) (HCC)   . Chronic kidney disease   . Colon polyp   . CVA (cerebral vascular accident) (HCC) 12/2014  . Depression   . Diabetes mellitus    w/neuropathy  . Hiatal hernia   . HLD (hyperlipidemia)   . Hypertension   . Kidney stone    hx of  . Numbness    hands, feet  . Prostate cancer [facility]) 2007   radiation tx [place]  . PTSD (post-traumatic stress disorder)   . Stroke (HCC)   . Vertigo    Past Surgical History:  Procedure Laterality Date  . CATARACT EXTRACTION Bilateral 2006 and 2007   bilateral  . COLON SURGERY  05/2011   hemicolectomy  . COLONOSCOPY    . CORONARY ARTERY BYPASS GRAFT  2004   x3    Allergies  Allergen Reactions  . Tape Other (See Comments)    Skin tears   . Aspirin Other (See Comments)  . Byetta 10 Mcg Pen [Exenatide] Nausea Only  . Invokana [Canagliflozin] Other (See Comments)    Orthostatic hypotension  . Levemir [Insulin Detemir] Other (See Comments)    Burning at injection site  . Nsaids   . Short Ragweed Pollen Ext Cough    Allergies as of 05/13/2023       Reactions   Tape Other (See Comments)   Skin tears   Aspirin Other (See Comments)   Byetta 10 Mcg Pen [exenatide] Nausea Only   Invokana [canagliflozin] Other (See Comments)   Orthostatic hypotension   Levemir [insulin Detemir] Other (See Comments)   Burning at injection site   Nsaids    Short Ragweed Pollen Ext Cough        Medication List        Accurate as of May 13, 2023  2:47 PM. If you have any questions, ask your nurse or doctor.          STOP taking these medications    ondansetron 4 MG disintegrating tablet Commonly known as: ZOFRAN-ODT Stopped by:  C        TAKE these medications    amiodarone 200 MG tablet Commonly known as: PACERONE Take 1 tablet (200 mg total) by mouth daily.   buPROPion 150 MG 24 hr tablet Commonly  known as: WELLBUTRIN XL Take 150 mg by mouth daily.   Farxiga 10 MG Tabs tablet Generic drug: dapagliflozin propanediol Take 10 mg by mouth daily.   fesoterodine 8 MG Tb24 tablet Commonly known as: TOVIAZ Take 8 mg by mouth daily.   HYDROcodone-acetaminophen 5-325 MG tablet Commonly known as: NORCO/VICODIN Take 1 tablet by mouth every 4 (four) hours as needed.   insulin glargine 100 UNIT/ML injection Commonly known as: LANTUS Inject 10 Units into the skin daily.   levothyroxine 88 MCG tablet Commonly known as: SYNTHROID Take 88 mcg by mouth every morning.   LOVAZA PO Take 1 tablet by mouth 2 (two) times daily.   lubiprostone 24 MCG capsule Commonly known as: AMITIZA Take  1 capsule (24 mcg total) by mouth 2 (two) times daily with a meal.   metoprolol succinate 25 MG 24 hr tablet Commonly known as: TOPROL-XL TAKE 1 TABLET (25 MG TOTAL) BY MOUTH DAILY.   Rivaroxaban 15 MG Tabs tablet Commonly known as: Xarelto TAKE 1 TABLET (15 MG TOTAL) BY MOUTH DAILY WITH SUPPER   rosuvastatin 10 MG tablet Commonly known as: CRESTOR Take 10 mg by mouth at bedtime.   tamsulosin 0.4 MG Caps capsule Commonly known as: FLOMAX Take 0.8 mg by mouth every other day.   Trulicity 1.5 MG/0.5ML Sopn Generic drug: Dulaglutide Inject 1.5 mg into the skin once a week.        Review of Systems  Constitutional:  Negative for appetite change, chills, fatigue, fever and unexpected weight change.  HENT:  Negative for congestion, dental problem, ear discharge, ear pain, facial swelling, hearing loss, nosebleeds, postnasal drip, rhinorrhea, sinus pressure, sinus pain, sneezing, sore throat, tinnitus and trouble swallowing.   Eyes:  Negative for pain, discharge, redness, itching and visual disturbance.  Respiratory:  Negative for cough, chest tightness, shortness of breath and wheezing.   Cardiovascular:  Positive for leg swelling. Negative for chest pain and palpitations.  Gastrointestinal:   Negative for abdominal distention, abdominal pain, blood in stool, nausea and vomiting.       Has some days 5  days of Constipation with alternate diarrhea   Endocrine: Negative for cold intolerance, heat intolerance, polydipsia, polyphagia and polyuria.  Genitourinary:  Negative for difficulty urinating, dysuria, flank pain, frequency and urgency.       Voids x 1 at night  Hx of prostate cancer   Musculoskeletal:  Negative for arthralgias, back pain, gait problem, joint swelling, myalgias, neck pain and neck stiffness.  Skin:  Negative for color change, pallor, rash and wound.  Neurological:  Negative for dizziness, syncope, speech difficulty, weakness, light-headedness and headaches.       Numbness on the fingers  Hematological:  Does not bruise/bleed easily.  Psychiatric/Behavioral:  Negative for agitation, behavioral problems, confusion, hallucinations, self-injury, sleep disturbance and suicidal ideas. The patient is not nervous/anxious.        PTSD from working at [employer] in [employer] History  Administered Date(s) Administered  . Influenza-Unspecified 06/08/2014  . Moderna Sars-Covid-2 Vaccination 11/30/2019, 12/29/2019, 06/21/2020, 01/14/2021   Pertinent  Health Maintenance Due  Topic Date Due  . FOOT EXAM  Never done  . OPHTHALMOLOGY EXAM  Never done  . HEMOGLOBIN A1C  09/25/2022  . INFLUENZA VACCINE  05/09/2023  . Colonoscopy  06/15/2025      12/11/2020    8:00 PM 12/12/2020   12:00 PM 12/18/2020    3:03 PM 08/24/2022    1:23 PM 05/13/2023    2:20 PM  Fall Risk  Falls in the past year?     0  Was there an injury with Fall?     0  Fall Risk Category Calculator     0  (RETIRED) Patient Fall Risk Level Low fall risk Low fall risk Low fall risk Low fall risk   Patient at Risk for Falls Due to     No Fall Risks  Fall risk Follow up     Falls evaluation completed   Functional Status Survey:    Vitals:   05/13/23 1427  BP: 122/88  Pulse: 61  Resp: 18   Temp: 98.6 F (37 C)  SpO2: 99%  Weight: 151 lb 12.8 oz (68.9 kg)  Height: 5\' 6"  (1.676 m)   Body mass index is 24.5 kg/m. Physical Exam Vitals reviewed.  Constitutional:      General: He is not in acute distress.    Appearance: Normal appearance. He is normal weight. He is not ill-appearing or diaphoretic.  HENT:     Head: Normocephalic.     Right Ear: Tympanic membrane, ear canal and external ear normal. There is no impacted cerumen.     Left Ear: Tympanic membrane, ear canal and external ear normal. There is no impacted cerumen.     Nose: Nose normal. No congestion or rhinorrhea.     Mouth/Throat:     Mouth: Mucous membranes are moist.     Pharynx: Oropharynx is clear. No oropharyngeal exudate or posterior oropharyngeal erythema.  Eyes:     General: No scleral icterus.       Right eye: No discharge.        Left eye: No discharge.     Extraocular Movements: Extraocular movements intact.     Conjunctiva/sclera: Conjunctivae normal.     Pupils: Pupils are equal, round, and reactive to light.  Neck:     Vascular: No carotid bruit.  Cardiovascular:     Rate and Rhythm: Normal rate and regular rhythm.     Pulses: Normal pulses.     Heart sounds: Normal heart sounds. No murmur heard.    No friction rub. No gallop.     Comments: Bilateral lower extremity varicose veins Pulmonary:     Effort: Pulmonary effort is normal. No respiratory distress.     Breath sounds: Normal breath sounds. No wheezing, rhonchi or rales.  Chest:     Chest wall: No tenderness.  Abdominal:     General: Bowel sounds are normal. There is no distension.     Palpations: Abdomen is soft. There is no mass.     Tenderness: There is no abdominal tenderness. There is no right CVA tenderness, left CVA tenderness, guarding or rebound.  Musculoskeletal:        General: No swelling or tenderness. Normal range of motion.     Cervical back: Normal range of motion. No rigidity or tenderness.     Right lower leg:  Edema present.     Left lower leg: Edema present.     Comments: Bilateral lower extremity trace to 1+ edema  Lymphadenopathy:     Cervical: No cervical adenopathy.  Skin:    General: Skin is warm and dry.     Coloration: Skin is not pale.     Findings: No bruising, erythema, lesion or rash.  Neurological:     Mental Status: He is alert and oriented to person, place, and time.     Cranial Nerves: No cranial nerve deficit.     Sensory: No sensory deficit.     Motor: No weakness.     Coordination: Coordination normal.     Gait: Gait normal.  Psychiatric:        Mood and Affect: Mood normal.        Speech: Speech normal.        Behavior: Behavior normal.        Thought Content: Thought content normal.        Judgment: Judgment normal.     Labs reviewed: Recent Labs    08/24/22 1325 12/03/22 0035 12/03/22 0144 03/22/23 1351  NA 139 138  --  141  K 4.1 3.5  --  4.0  CL 103 105  --  105  CO2 25 24  --  26  GLUCOSE 200* 184*  --  138*  BUN 19 18  --  23  CREATININE 1.20 1.41*  --  1.55*  CALCIUM 9.0 8.4*  --  9.4  MG  --   --  2.0  --    Recent Labs    09/20/22 0952 12/03/22 0035 03/22/23 1351  AST 23 20 18   ALT 19 16 13   ALKPHOS 54 43 39  BILITOT 0.7 0.8 0.7  PROT 6.0 5.6* 6.4*  ALBUMIN 3.9 2.7* 3.9   Recent Labs    08/24/22 1325 12/03/22 0035 03/22/23 1351  WBC 7.1 5.3 5.1  NEUTROABS  --   --  2.9  HGB 11.6* 10.3* 10.1*  HCT 33.9* 31.2* 31.8*  MCV 89.2 83.9 79.3*  PLT 230 226 299   Lab Results  Component Value Date   TSH 1.420 09/20/2022   Lab Results  Component Value Date   HGBA1C 6.8 (H) 12/11/2020   Lab Results  Component Value Date   CHOL 109 02/26/2019   HDL 51 02/26/2019   LDLCALC 33 02/26/2019   TRIG 126 02/26/2019   CHOLHDL 2.1 02/26/2019    Significant Diagnostic Results in last 30 days:  No results found.  Assessment/Plan 1. Type 2 diabetes mellitus with diabetic peripheral angiopathy without gangrene, with long-term current  use of insulin (HCC) Lab Results  Component Value Date   HGBA1C 6.8 (H) 12/11/2020  No home CBG for review -Continue on Lantus and Trulicity -On rosuvastatin for cardiovascular event prevention - Microalbumin / creatinine urine ratio; Future - Lipid Panel; Future - CBC with Differential/Platelet; Future - COMPLETE METABOLIC PANEL WITH GFR; Future - Hemoglobin A1c; Future  2. Paroxysmal atrial fibrillation (HCC) - Heart rate controlled -Continue on Xarelto tends to bruise easily but no symptoms of GI bleed. -Continue on Metroprolol and amiodarone -Continue to follow-up with a cardiologist as directed - CBC with Differential/Platelet; Future - COMPLETE METABOLIC PANEL WITH GFR; Future - TSH; Future  3. Chronic systolic heart failure (HCC) No signs of fluid overload.  Bilateral lower extremity trace to 1+ edema noted. -Encouraged to wear knee-high compression stockings -Keep legs elevated when seated -Not on diuretic - COMPLETE METABOLIC PANEL WITH GFR; Future  4. Obstructive sleep apnea Reports sleeping well at night without CPAP  5. Coronary artery disease involving coronary bypass graft of native heart with angina pectoris (HCC) Chest pain-free -Continue on rosuvastatin, Xarelto and metoprolol  6. Weight loss, abnormal Has had progressive weight loss with decreased oral intake. Reports feelings of fullness and bloating. Will rule out H. pylori. -Encouraged small but frequent meals - CBC with Differential/Platelet; Future - COMPLETE METABOLIC PANEL WITH GFR; Future - TSH; Future  7. Encounter to establish care ***  8. Abdominal bloating *** - H. pylori breath test; Future  9. Gastroesophageal reflux disease without esophagitis *** - H. pylori breath test; Future  10. Mixed hyperlipidemia *** - Lipid Panel; Future  11. ILD (interstitial lung disease) (HCC) ***  12. Hypothyroidism (acquired) *** - TSH; Future  13. Current moderate episode of major  depressive disorder, unspecified whether recurrent (HCC) ***    Family/ staff Communication: Reviewed plan of care with patient  Labs/tests ordered: None   Next Appointment :   Caesar Bookman, NP
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
## 2023-05-14 ENCOUNTER — Encounter: Payer: Self-pay | Admitting: Family

## 2023-05-14 DIAGNOSIS — N189 Chronic kidney disease, unspecified: Secondary | ICD-10-CM | POA: Insufficient documentation

## 2023-05-14 NOTE — Patient Instructions (Signed)
Here are few numbers to contact for counseling services in [place] area.May also search on website if numbers have changed:   [facility]  telephone # [phone]  [facility] [phone] [facility] [phone] counseling Center [phone]

## 2023-05-15 ENCOUNTER — Other Ambulatory Visit: Payer: Medicare HMO

## 2023-05-15 DIAGNOSIS — R140 Abdominal distension (gaseous): Secondary | ICD-10-CM

## 2023-05-15 DIAGNOSIS — E782 Mixed hyperlipidemia: Secondary | ICD-10-CM

## 2023-05-15 DIAGNOSIS — I480 Paroxysmal atrial fibrillation: Secondary | ICD-10-CM

## 2023-05-15 DIAGNOSIS — I5022 Chronic systolic (congestive) heart failure: Secondary | ICD-10-CM

## 2023-05-15 DIAGNOSIS — Z794 Long term (current) use of insulin: Secondary | ICD-10-CM

## 2023-05-15 DIAGNOSIS — E039 Hypothyroidism, unspecified: Secondary | ICD-10-CM

## 2023-05-15 DIAGNOSIS — R634 Abnormal weight loss: Secondary | ICD-10-CM

## 2023-05-15 DIAGNOSIS — K219 Gastro-esophageal reflux disease without esophagitis: Secondary | ICD-10-CM

## 2023-05-15 LAB — CBC WITH DIFFERENTIAL/PLATELET
Absolute Monocytes: 583 cells/uL (ref 200–950)
Basophils Absolute: 32 cells/uL (ref 0–200)
Basophils Relative: 0.6 %
Eosinophils Absolute: 69 cells/uL (ref 15–500)
Eosinophils Relative: 1.3 %
HCT: 32.1 % — ABNORMAL LOW (ref 38.5–50.0)
Hemoglobin: 10.2 g/dL — ABNORMAL LOW (ref 13.2–17.1)
Lymphs Abs: 1495 cells/uL (ref 850–3900)
MCH: 25.4 pg — ABNORMAL LOW (ref 27.0–33.0)
MCHC: 31.8 g/dL — ABNORMAL LOW (ref 32.0–36.0)
MCV: 79.9 fL — ABNORMAL LOW (ref 80.0–100.0)
MPV: 9.9 fL (ref 7.5–12.5)
Monocytes Relative: 11 %
Neutro Abs: 3122 cells/uL (ref 1500–7800)
Neutrophils Relative %: 58.9 %
Platelets: 296 10*3/uL (ref 140–400)
RBC: 4.02 10*6/uL — ABNORMAL LOW (ref 4.20–5.80)
RDW: 15.4 % — ABNORMAL HIGH (ref 11.0–15.0)
Total Lymphocyte: 28.2 %
WBC: 5.3 10*3/uL (ref 3.8–10.8)

## 2023-05-15 LAB — TSH: TSH: 0.14 mIU/L — ABNORMAL LOW (ref 0.40–4.50)

## 2023-05-29 ENCOUNTER — Encounter: Payer: Medicare HMO | Admitting: Internal Medicine
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
## 2023-06-18 ENCOUNTER — Encounter: Payer: Self-pay | Admitting: Nurse Practitioner

## 2023-06-18 ENCOUNTER — Ambulatory Visit: Payer: Medicare HMO | Attending: Nurse Practitioner | Admitting: Nurse Practitioner

## 2023-06-18 VITALS — BP 102/60 | HR 53 | Ht 66.0 in | Wt 153.0 lb

## 2023-06-18 DIAGNOSIS — I480 Paroxysmal atrial fibrillation: Secondary | ICD-10-CM

## 2023-06-18 DIAGNOSIS — I10 Essential (primary) hypertension: Secondary | ICD-10-CM | POA: Diagnosis not present

## 2023-06-18 DIAGNOSIS — I951 Orthostatic hypotension: Secondary | ICD-10-CM

## 2023-06-18 DIAGNOSIS — I5022 Chronic systolic (congestive) heart failure: Secondary | ICD-10-CM | POA: Diagnosis not present

## 2023-06-18 DIAGNOSIS — G4733 Obstructive sleep apnea (adult) (pediatric): Secondary | ICD-10-CM

## 2023-06-18 DIAGNOSIS — I25709 Atherosclerosis of coronary artery bypass graft(s), unspecified, with unspecified angina pectoris: Secondary | ICD-10-CM | POA: Diagnosis not present

## 2023-06-18 MED ORDER — METOPROLOL SUCCINATE ER 25 MG PO TB24: 12.5000 mg | ORAL_TABLET | Freq: Every day | ORAL | 4 refills | Status: DC

## 2023-06-18 NOTE — Patient Instructions (Signed)
Medication Instructions:  DECREASE Metoprolol to 12.5mg  take 1 tablet once a day  Speak with your Urologist about decreasing your Tamsulosin  *If you need a refill on your cardiac medications before your next appointment, please call your pharmacy*   Lab Work: None ordered   Testing/Procedures: None ordered   Follow-Up: At [facility], you and your health needs are our priority.  As part of our continuing mission to provide you with exceptional heart care, we have created designated Provider Care Teams.  These Care Teams include your primary Cardiologist (physician) and Advanced Practice Providers (APPs -  Physician Assistants and Nurse Practitioners) who all work together to provide you with the care you need, when you need it.  We recommend signing up for the patient portal called "MyChart".  Sign up information is provided on this After Visit Summary.  MyChart is used to connect with patients for Virtual Visits (Telemedicine).  Patients are able to view lab/test results, encounter notes, upcoming appointments, etc.  Non-urgent messages can be sent to your provider as well.   To learn more about what you can do with MyChart, go to [email or web address].    Your next appointment:   3 month(s)  Provider:   Dr Odis Hollingshead Dr Jacinto Halim Dr Rosemary Holms  Other Instructions Check your blood pressure daily for 2 weeks, then contact the office with your readings.  Make sure to check 2 hours after your medications.   AVOID these things for 30 minutes before checking your blood pressure: No Drinking caffeine. No Drinking alcohol. No Eating. No Smoking. No Exercising.  Five minutes before checking your blood pressure: Pee. Sit in a dining chair. Avoid sitting in a soft couch or armchair. Be quiet. Do not talk.

## 2023-06-20 ENCOUNTER — Ambulatory Visit (HOSPITAL_BASED_OUTPATIENT_CLINIC_OR_DEPARTMENT_OTHER)
Admission: RE | Admit: 2023-06-20 | Discharge: 2023-06-20 | Disposition: A | Discharge status: Home / Self Care | Payer: Medicare HMO | Source: Ambulatory Visit | Attending: Pulmonary Disease | Admitting: Pulmonary Disease

## 2023-06-20 DIAGNOSIS — J849 Interstitial pulmonary disease, unspecified: Secondary | ICD-10-CM | POA: Diagnosis present

## 2023-06-24 ENCOUNTER — Ambulatory Visit (INDEPENDENT_AMBULATORY_CARE_PROVIDER_SITE_OTHER): Payer: Medicare HMO | Admitting: Pulmonary Disease

## 2023-06-24 ENCOUNTER — Encounter: Payer: Self-pay | Admitting: Pulmonary Disease

## 2023-06-24 VITALS — BP 104/58 | HR 60 | Temp 97.2°F | Ht 67.0 in | Wt 144.8 lb

## 2023-06-24 DIAGNOSIS — J849 Interstitial pulmonary disease, unspecified: Secondary | ICD-10-CM | POA: Diagnosis not present

## 2023-06-24 NOTE — Progress Notes (Signed)
Walter [name]    [id number]    16-Mar-1946  Primary Care Physician:Raju, Dorris Singh, MD  Referring Physician: No referring provider defined for this encounter.  Chief complaint: Follow-up for interstitial lung disease  HPI: 77 y.o. who  has a past medical history of Abdominal pain, Atrial fibrillation (HCC), Barrett's esophagus, CAD (coronary artery disease), Cancer (HCC), CHF (congestive heart failure) (HCC), Chronic constipation, Chronic kidney disease, Colon polyp, CVA (cerebral vascular accident) (HCC) (12/2014), Depression, Diabetes mellitus, Hiatal hernia, HLD (hyperlipidemia), Hypertension, ILD (interstitial lung disease) (HCC) (2008), Kidney stone, Numbness, Prostate cancer (HCC) (2007), PTSD (post-traumatic stress disorder), Stroke (HCC), and Vertigo.   Referred for evaluation of interstitial lung disease noted on recent screening CT of the chest.  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  He made it out of the building but had significant exposure to inhaled toxins after collapse of the towers.  No ongoing exposures.  No mold, hot tub, Jacuzzi.  No feather pillows or comforters Smoking history: 40-pack-year smoker.  Quit in 2008 Travel history: Previously lived in [place] and [place].  Moved to [place] in 2008.  No significant recent travel Relevant family history: No family history of lung disease.  Interim history: Breathing is doing well.  No new issues today He is here for review of CT scan  Outpatient Encounter Medications as of 06/24/2023  Medication Sig   amiodarone (PACERONE) 200 MG tablet Take 1 tablet (200 mg total) by mouth daily.   buPROPion (WELLBUTRIN XL) 150 MG 24 hr tablet  Take 150 mg by mouth daily.   FARXIGA 10 MG TABS tablet Take 10 mg by mouth daily.   fesoterodine (TOVIAZ) 8 MG TB24 tablet Take 8 mg by mouth daily.   insulin glargine (LANTUS) 100 UNIT/ML injection Inject 22 Units into the skin daily.   levothyroxine (SYNTHROID) 75 MCG tablet Take 75 mcg by mouth daily.   lubiprostone (AMITIZA) 24 MCG capsule Take 1 capsule (24 mcg total) by mouth 2 (two) times daily with a meal.   metoprolol succinate (TOPROL-XL) 25 MG 24 hr tablet Take 0.5 tablets (12.5 mg total) by mouth daily.   Omega-3-acid Ethyl Esters (LOVAZA PO) Take 1 tablet by mouth 2 (two) times daily.   Rivaroxaban (XARELTO) 15 MG TABS tablet TAKE 1 TABLET (15 MG TOTAL) BY MOUTH DAILY WITH SUPPER   rosuvastatin (CRESTOR) 10 MG tablet Take 10 mg by mouth at bedtime.   tamsulosin (FLOMAX) 0.4 MG CAPS capsule Take 0.8 mg by mouth every other day.   TRULICITY 0.75 MG/0.5ML SOPN Inject 0.75 mg into the muscle once a week.   No facility-administered encounter medications on file as of 06/24/2023.   Physical Exam: Blood pressure (!) 104/58, pulse 60, temperature (!) 97.2 F (36.2 C), temperature source Temporal, height 5\' 7"  (1.702 m), weight 144 lb 12.8 oz (65.7 kg), SpO2 100%. Gen:      No acute distress HEENT:  EOMI, sclera anicteric Neck:     No masses; no thyromegaly Lungs:    Clear to auscultation bilaterally; normal respiratory effort CV:         Regular rate and rhythm; no murmurs Abd:      +  bowel sounds; soft, non-tender; no palpable masses, no distension Ext:    No edema; adequate peripheral perfusion Skin:      Warm and dry; no rash Neuro: alert and oriented x 3 Psych: normal mood and affect   Data Reviewed: Imaging: Lung cancer screening CT 03/21/2022-mild bronchial wall thickening with emphysema mild groundglass attenuation with subpleural reticulation, bronchiectasis at the lung base bilaterally.  Probable UIP pattern  High resolution CT 06/20/2023-radiologist read is pending.  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  He does have significant exposure from [facility] collapse on 9/11 and history of hemothorax on the left the changes may be related to that.  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  Rheumatoid factor is elevated but he does not have joint symptoms.  He is also on amiodarone but CT scan is not typical  Discussed further work-up, possible etiology in detail with him in clinic.  He is interested in conservative management and does not want any aggressive work-up or treatment at present Order high resolution CT and PFTs in 12 months  Plan/Recommendations: Follow High res CT and PFTs in 12 months  Chilton Greathouse MD [facility] 06/24/2023, 10:48 AM  CC: No ref. provider found

## 2023-06-24 NOTE — Patient Instructions (Signed)
His CT is stable which is good news Will order a follow-up high-resolution CT and PFTs in 1 year Return to clinic in 1 year after these tests

## 2023-06-25 ENCOUNTER — Other Ambulatory Visit: Payer: Self-pay

## 2023-06-25 MED ORDER — RIVAROXABAN 15 MG PO TABS: ORAL_TABLET | ORAL | 1 refills | Status: DC

## 2023-06-25 NOTE — Telephone Encounter (Signed)
Received faxed refill request from [facility] [address] for Xarelto.  Pt last saw Robin Searing, NP on 06/18/23, last labs 05/15/23 Creat 1.50, age 77, weight 65.7kg, CrCl 38.33, based on CrCl pt is on appropriate dosage of Xarelto 15mg  every day for afib.  Will refill rx.

## 2023-06-27 ENCOUNTER — Encounter: Payer: Self-pay | Admitting: Gastroenterology

## 2023-07-20 ENCOUNTER — Other Ambulatory Visit: Payer: Self-pay | Admitting: Gastroenterology

## 2023-08-16 ENCOUNTER — Encounter: Payer: Self-pay | Admitting: Gastroenterology

## 2023-08-21 ENCOUNTER — Other Ambulatory Visit: Payer: Self-pay | Admitting: Gastroenterology

## 2023-08-22 ENCOUNTER — Telehealth: Payer: Self-pay | Admitting: Gastroenterology

## 2023-08-22 ENCOUNTER — Encounter: Payer: Self-pay | Admitting: Gastroenterology

## 2023-08-22 MED ORDER — LUBIPROSTONE 24 MCG PO CAPS: 24.0000 ug | ORAL_CAPSULE | Freq: Two times a day (BID) | ORAL | 2 refills | Status: DC

## 2023-08-22 NOTE — Telephone Encounter (Signed)
Rx sent 

## 2023-08-22 NOTE — Telephone Encounter (Signed)
PT is calling to have a refill on [facility] sent [facility] [address]. He as scheduled an OV for 12/03/2023

## 2023-10-06 ENCOUNTER — Encounter (HOSPITAL_COMMUNITY): Payer: Self-pay

## 2023-10-06 ENCOUNTER — Emergency Department (HOSPITAL_COMMUNITY)
Admission: EM | Admit: 2023-10-06 | Discharge: 2023-10-07 | Disposition: A | Discharge status: Home / Self Care | Payer: Medicare HMO | Attending: Emergency Medicine | Admitting: Emergency Medicine

## 2023-10-06 ENCOUNTER — Other Ambulatory Visit: Payer: Self-pay

## 2023-10-06 ENCOUNTER — Emergency Department (HOSPITAL_COMMUNITY): Payer: Medicare HMO

## 2023-10-06 DIAGNOSIS — Z8673 Personal history of transient ischemic attack (TIA), and cerebral infarction without residual deficits: Secondary | ICD-10-CM | POA: Diagnosis not present

## 2023-10-06 DIAGNOSIS — K529 Noninfective gastroenteritis and colitis, unspecified: Secondary | ICD-10-CM | POA: Diagnosis not present

## 2023-10-06 DIAGNOSIS — Z7984 Long term (current) use of oral hypoglycemic drugs: Secondary | ICD-10-CM | POA: Insufficient documentation

## 2023-10-06 DIAGNOSIS — I129 Hypertensive chronic kidney disease with stage 1 through stage 4 chronic kidney disease, or unspecified chronic kidney disease: Secondary | ICD-10-CM | POA: Insufficient documentation

## 2023-10-06 DIAGNOSIS — Z8546 Personal history of malignant neoplasm of prostate: Secondary | ICD-10-CM | POA: Diagnosis not present

## 2023-10-06 DIAGNOSIS — E1122 Type 2 diabetes mellitus with diabetic chronic kidney disease: Secondary | ICD-10-CM | POA: Insufficient documentation

## 2023-10-06 DIAGNOSIS — R109 Unspecified abdominal pain: Secondary | ICD-10-CM | POA: Diagnosis present

## 2023-10-06 DIAGNOSIS — N189 Chronic kidney disease, unspecified: Secondary | ICD-10-CM | POA: Diagnosis not present

## 2023-10-06 DIAGNOSIS — Z7901 Long term (current) use of anticoagulants: Secondary | ICD-10-CM | POA: Insufficient documentation

## 2023-10-06 DIAGNOSIS — Z79899 Other long term (current) drug therapy: Secondary | ICD-10-CM | POA: Insufficient documentation

## 2023-10-06 DIAGNOSIS — Z794 Long term (current) use of insulin: Secondary | ICD-10-CM | POA: Diagnosis not present

## 2023-10-06 DIAGNOSIS — I4891 Unspecified atrial fibrillation: Secondary | ICD-10-CM | POA: Insufficient documentation

## 2023-10-06 DIAGNOSIS — I2510 Atherosclerotic heart disease of native coronary artery without angina pectoris: Secondary | ICD-10-CM | POA: Insufficient documentation

## 2023-10-06 LAB — URINALYSIS, ROUTINE W REFLEX MICROSCOPIC
Bacteria, UA: NONE SEEN
Bilirubin Urine: NEGATIVE
Glucose, UA: >=500 mg/dL — AB
Hgb urine dipstick: NEGATIVE
Ketones, ur: 5 mg/dL — AB
Leukocytes,Ua: NEGATIVE
Nitrite: NEGATIVE
Protein, ur: 30 mg/dL — AB
Specific Gravity, Urine: 1.028 (ref 1.005–1.030)
pH: 7 (ref 5.0–8.0)

## 2023-10-06 LAB — COMPREHENSIVE METABOLIC PANEL
ALT: 18 U/L (ref 0–44)
AST: 24 U/L (ref 15–41)
Albumin: 3.5 g/dL (ref 3.5–5.0)
Alkaline Phosphatase: 32 U/L — ABNORMAL LOW (ref 38–126)
Anion gap: 12 (ref 5–15)
BUN: 25 mg/dL — ABNORMAL HIGH (ref 8–23)
CO2: 21 mmol/L — ABNORMAL LOW (ref 22–32)
Calcium: 9 mg/dL (ref 8.9–10.3)
Chloride: 105 mmol/L (ref 98–111)
Creatinine, Ser: 1.31 mg/dL — ABNORMAL HIGH (ref 0.61–1.24)
GFR, Estimated: 56 mL/min — ABNORMAL LOW (ref >60)
Glucose, Bld: 141 mg/dL — ABNORMAL HIGH (ref 70–99)
Potassium: 3.7 mmol/L (ref 3.5–5.1)
Sodium: 138 mmol/L (ref 135–145)
Total Bilirubin: 0.9 mg/dL (ref <1.2)
Total Protein: 6.7 g/dL (ref 6.5–8.1)

## 2023-10-06 LAB — I-STAT CG4 LACTIC ACID, ED: Lactic Acid, Venous: 0.6 mmol/L (ref 0.5–1.9)

## 2023-10-06 LAB — CBC
HCT: 33.8 % — ABNORMAL LOW (ref 39.0–52.0)
Hemoglobin: 10.9 g/dL — ABNORMAL LOW (ref 13.0–17.0)
MCH: 27 pg (ref 26.0–34.0)
MCHC: 32.2 g/dL (ref 30.0–36.0)
MCV: 83.9 fL (ref 80.0–100.0)
Platelets: 283 10*3/uL (ref 150–400)
RBC: 4.03 MIL/uL — ABNORMAL LOW (ref 4.22–5.81)
RDW: 19.4 % — ABNORMAL HIGH (ref 11.5–15.5)
WBC: 6.5 10*3/uL (ref 4.0–10.5)
nRBC: 0 % (ref 0.0–0.2)

## 2023-10-06 LAB — LIPASE, BLOOD: Lipase: 28 U/L (ref 11–51)

## 2023-10-06 MED ORDER — MORPHINE SULFATE (PF) 4 MG/ML IV SOLN
4.0000 mg | Freq: Once | INTRAVENOUS | Status: AC
  Administered 2023-10-06: 4 mg via INTRAVENOUS
  Filled 2023-10-06: qty 1

## 2023-10-06 MED ORDER — IOHEXOL 300 MG/ML  SOLN
100.0000 mL | Freq: Once | INTRAMUSCULAR | Status: AC | PRN
  Administered 2023-10-06: 100 mL via INTRAVENOUS

## 2023-10-06 MED ORDER — ONDANSETRON HCL 4 MG/2ML IJ SOLN
4.0000 mg | Freq: Once | INTRAMUSCULAR | Status: AC | PRN
  Administered 2023-10-06: 4 mg via INTRAVENOUS
  Filled 2023-10-06: qty 2

## 2023-10-06 NOTE — ED Provider Notes (Cosign Needed)
[place] EMERGENCY DEPARTMENT AT [facility] Provider Note   CSN: [id number] Arrival date & time: 10/06/23  1628     History  Chief Complaint  Patient presents with   Abdominal Pain    Walter [name] is a 77 y.o. male.  Patient to ED with abdominal pain that started around 1:00 this afternoon in the hypogastric abdomen. He describes the pain as severe, associated with nausea and vomiting. No fever. Last bowel movement was earlier today, small volume, "hard". No blood. The pain has improved over time. No urinary symptoms, chest pain, SOB. He reports history of hemicolectomy 12 years ago for polyps suspected to be malignant but were benign, per patient.   The history is provided by the patient. No language interpreter was used.  Abdominal Pain      Home Medications Prior to Admission medications   Medication Sig Start Date End Date Taking? Authorizing Provider  amiodarone (PACERONE) 200 MG tablet Take 1 tablet (200 mg total) by mouth daily. 11/09/22   Gaston Islam., NP  buPROPion (WELLBUTRIN XL) 150 MG 24 hr tablet Take 150 mg by mouth daily.    [provider]  FARXIGA 10 MG TABS tablet Take 10 mg by mouth daily. 09/10/22   [provider]  fesoterodine (TOVIAZ) 8 MG TB24 tablet Take 8 mg by mouth daily. 05/24/21   [provider]  insulin glargine (LANTUS) 100 UNIT/ML injection Inject 22 Units into the skin daily.    [provider]  levothyroxine (SYNTHROID) 75 MCG tablet Take 75 mcg by mouth daily. 06/16/23   [provider]  lubiprostone (AMITIZA) 24 MCG capsule Take 1 capsule (24 mcg total) by mouth 2 (two) times daily with a meal. KEEP 11/2023 APPT FOR FURTHER REFILLS 08/22/23   Cirigliano, Vito V, DO  metoprolol succinate (TOPROL-XL) 25 MG 24 hr tablet Take 0.5 tablets (12.5 mg total) by mouth daily. 06/18/23   Gaston Islam., NP  Omega-3-acid Ethyl Esters (LOVAZA PO) Take 1 tablet by mouth 2 (two) times daily.     [provider]  Rivaroxaban (XARELTO) 15 MG TABS tablet TAKE 1 TABLET (15 MG TOTAL) BY MOUTH DAILY WITH SUPPER 06/25/23   Gaston Islam., NP  rosuvastatin (CRESTOR) 10 MG tablet Take 10 mg by mouth at bedtime.    [provider]  tamsulosin (FLOMAX) 0.4 MG CAPS capsule Take 0.8 mg by mouth every other day.    [provider]  TRULICITY 0.75 MG/0.5ML SOPN Inject 0.75 mg into the muscle once a week. 05/01/23   [provider]      Allergies    Tape, Aspirin, Byetta 10 mcg pen [exenatide], Invokana [canagliflozin], Levemir [insulin detemir], Nsaids, and Short ragweed pollen ext    Review of Systems   Review of Systems  Gastrointestinal:  Positive for abdominal pain.    Physical Exam Updated Vital Signs BP (!) 148/79 (BP Location: Right Arm)   Pulse 68   Temp 98.2 F (36.8 C) (Oral)   Resp 18   Ht 5\' 7"  (1.702 m)   Wt 65.7 kg   SpO2 100%   BMI 22.69 kg/m  Physical Exam Vitals and nursing note reviewed.  Constitutional:      Appearance: He is well-developed.  HENT:     Head: Normocephalic.  Cardiovascular:     Rate and Rhythm: Normal rate.  Pulmonary:     Effort: Pulmonary effort is normal.  Abdominal:     General: Bowel sounds  are absent.     Palpations: Abdomen is soft.     Tenderness: There is abdominal tenderness in the right upper quadrant, right lower quadrant, periumbilical area, suprapubic area and left lower quadrant.     Hernia: No hernia is present.  Musculoskeletal:        General: Normal range of motion.     Cervical back: Normal range of motion and neck supple.  Skin:    General: Skin is warm and dry.  Neurological:     Mental Status: He is alert and oriented to person, place, and time.     ED Results / Procedures / Treatments   Labs (all labs ordered are listed, but only abnormal results are displayed) Labs Reviewed  COMPREHENSIVE METABOLIC PANEL - Abnormal; Notable for the following components:      Result  Value   CO2 21 (*)    Glucose, Bld 141 (*)    BUN 25 (*)    Creatinine, Ser 1.31 (*)    Alkaline Phosphatase 32 (*)    GFR, Estimated 56 (*)    All other components within normal limits  CBC - Abnormal; Notable for the following components:   RBC 4.03 (*)    Hemoglobin 10.9 (*)    HCT 33.8 (*)    RDW 19.4 (*)    All other components within normal limits  URINALYSIS, ROUTINE W REFLEX MICROSCOPIC - Abnormal; Notable for the following components:   Glucose, UA >=500 (*)    Ketones, ur 5 (*)    Protein, ur 30 (*)    All other components within normal limits  LIPASE, BLOOD  I-STAT CG4 LACTIC ACID, ED   Results for orders placed or performed during the hospital encounter of 10/06/23  Lipase, blood   Collection Time: 10/06/23  5:00 PM  Result Value Ref Range   Lipase 28 11 - 51 U/L  Comprehensive metabolic panel   Collection Time: 10/06/23  5:00 PM  Result Value Ref Range   Sodium 138 135 - 145 mmol/L   Potassium 3.7 3.5 - 5.1 mmol/L   Chloride 105 98 - 111 mmol/L   CO2 21 (L) 22 - 32 mmol/L   Glucose, Bld 141 (H) 70 - 99 mg/dL   BUN 25 (H) 8 - 23 mg/dL   Creatinine, Ser 4.01 (H) 0.61 - 1.24 mg/dL   Calcium 9.0 8.9 - 02.7 mg/dL   Total Protein 6.7 6.5 - 8.1 g/dL   Albumin 3.5 3.5 - 5.0 g/dL   AST 24 15 - 41 U/L   ALT 18 0 - 44 U/L   Alkaline Phosphatase 32 (L) 38 - 126 U/L   Total Bilirubin 0.9 <1.2 mg/dL   GFR, Estimated 56 (L) >60 mL/min   Anion gap 12 5 - 15  CBC   Collection Time: 10/06/23  5:00 PM  Result Value Ref Range   WBC 6.5 4.0 - 10.5 K/uL   RBC 4.03 (L) 4.22 - 5.81 MIL/uL   Hemoglobin 10.9 (L) 13.0 - 17.0 g/dL   HCT 25.3 (L) 66.4 - 40.3 %   MCV 83.9 80.0 - 100.0 fL   MCH 27.0 26.0 - 34.0 pg   MCHC 32.2 30.0 - 36.0 g/dL   RDW 47.4 (H) 25.9 - 56.3 %   Platelets 283 150 - 400 K/uL   nRBC 0.0 0.0 - 0.2 %  Urinalysis, Routine w reflex microscopic -Urine, Clean Catch   Collection Time: 10/06/23 10:01 PM  Result Value Ref Range   Color, Urine  YELLOW YELLOW    APPearance CLEAR CLEAR   Specific Gravity, Urine 1.028 1.005 - 1.030   pH 7.0 5.0 - 8.0   Glucose, UA >=500 (A) NEGATIVE mg/dL   Hgb urine dipstick NEGATIVE NEGATIVE   Bilirubin Urine NEGATIVE NEGATIVE   Ketones, ur 5 (A) NEGATIVE mg/dL   Protein, ur 30 (A) NEGATIVE mg/dL   Nitrite NEGATIVE NEGATIVE   Leukocytes,Ua NEGATIVE NEGATIVE   RBC / HPF 0-5 0 - 5 RBC/hpf   WBC, UA 0-5 0 - 5 WBC/hpf   Bacteria, UA NONE SEEN NONE SEEN   Squamous Epithelial / HPF 0-5 0 - 5 /HPF  I-Stat Lactic Acid   Collection Time: 10/06/23 11:41 PM  Result Value Ref Range   Lactic Acid, Venous 0.6 0.5 - 1.9 mmol/L     EKG None  Radiology CT ABDOMEN PELVIS W CONTRAST Result Date: 10/06/2023 CLINICAL DATA:  Right-sided abdominal pain with nausea and vomiting. Bowel obstruction suspected. EXAM: CT ABDOMEN AND PELVIS WITH CONTRAST TECHNIQUE: Multidetector CT imaging of the abdomen and pelvis was performed using the standard protocol following bolus administration of intravenous contrast. RADIATION DOSE REDUCTION: This exam was performed according to the departmental dose-optimization program which includes automated exposure control, adjustment of the mA and/or kV according to patient size and/or use of iterative reconstruction technique. CONTRAST:  OMNIPAQUE IOHEXOL 300 MG/ML  SOLN COMPARISON:  CT 08/06/2022 FINDINGS: Lower chest: No acute findings. Subpleural scarring in the lung bases. Hepatobiliary: No suspicious liver lesion. Calcified gallstones within physiologically distended gallbladder. No pericholecystic inflammation. No common bile duct dilatation. Pancreas: No ductal dilatation or inflammation. Spleen: Normal in size without focal abnormality. Adrenals/Urinary Tract: No adrenal nodule. The left kidney is absent. No right hydronephrosis or perinephric inflammation. Small right renal cysts. No further follow-up imaging is recommended. Urinary bladder is minimally distended, equivocal wall thickening.  Stomach/Bowel: The stomach is nondistended. No small bowel obstruction. Mild enhancement of the neoterminal ileum in the right upper quadrant series 2, image 40. Right hemicolectomy. Moderate stool within the transverse colon. Small to moderate stool in the more distal colon. There is colonic redundancy. No colonic inflammation. Vascular/Lymphatic: Aortic atherosclerosis without aneurysm. No acute vascular findings. No enlarged lymph nodes in the abdomen or pelvis. Reproductive: Brachytherapy seeds in the prostate. Other: Trace free fluid in the pelvis. No free air or focal fluid collection. Stable irregular calcified density within the anterior omentum, possible remote omental infarct. This needs no specific imaging follow-up. No abdominal wall hernia. Musculoskeletal: Lumbar degenerative change.  No focal bone lesion. IMPRESSION: 1. Right hemicolectomy. Mild enhancement of the neoterminal ileum in the right abdomen, can be seen with enteritis. No bowel obstruction. 2. Cholelithiasis without cholecystitis. 3. Trace free fluid in the pelvis, likely reactive. Aortic Atherosclerosis (ICD10-I70.0). Electronically Signed   By: Narda Rutherford M.D.   On: 10/06/2023 23:00    Procedures Procedures    Medications Ordered in ED Medications  ondansetron (ZOFRAN) injection 4 mg (4 mg Intravenous Given 10/06/23 1702)  morphine (PF) 4 MG/ML injection 4 mg (4 mg Intravenous Given 10/06/23 2220)  iohexol (OMNIPAQUE) 300 MG/ML solution 100 mL (100 mLs Intravenous Contrast Given 10/06/23 2231)    ED Course/ Medical Decision Making/ A&P                                 Medical Decision Making This patient presents to the ED for concern of abdominal pain, this involves an extensive  number of treatment options, and is a complaint that carries with it a high risk of complications and morbidity.  The differential diagnosis includes SBO/LBO, aneurysm, colitis, viral GE, ischemic bowel   Co morbidities that complicate  the patient evaluation  CKD, T2DM, prostate CA, HTN, CAD, a-fib on Xarelto, CVA, PTSD, nephrolithiasis, HLD, ILD   Additional history obtained:  Additional history and/or information obtained from chart review, notable for daughter at bedside   Lab Tests:  I Ordered, and personally interpreted labs.  The pertinent results include:  UA - slightly concentrated with sp gr 1.028, >500 glucose, 30 protein; no leukocytosis, hgb 10.9; Cr 1.31 (baseline)    Imaging Studies ordered:  I ordered imaging studies including Ct abd/pel Per radiologist interpretation: IMPRESSION: 1. Right hemicolectomy. Mild enhancement of the neoterminal ileum in the right abdomen, can be seen with enteritis. No bowel obstruction. 2. Cholelithiasis without cholecystitis. 3. Trace free fluid in the pelvis, likely reactive.  Medicines ordered and prescription drug management:  I ordered medication including Morphine  for pain Reevaluation of the patient after these medicines showed that the patient improved I have reviewed the patients home medicines and have made adjustments as needed   Test Considered:  CT abd/pel ordered r/o obstruction, further evaluation given significant PMH  Consultations Obtained:  I requested consultation with the ED attending, Dr. Theresia Lo,  and discussed lab and imaging findings as well as pertinent plan - after review of CT results, labs, all reassuring, discussed supportive treatment, no antibiotics at this point.    Problem List / ED Course:  Abdominal pain for several hours today Felt constipated, small hard stool x 1 today History of hemicolectomy - concern for obstruction     Reevaluation:  After the interventions noted above, I reevaluated the patient and found that they have :improved   Social Determinants of Health:  Well established health care Good family support   Disposition:  After consideration of the diagnostic results and the patients response  to treatment, I feel that the patient would benefit from discharge to home. Discussed return precautions. Follow up closely with PCP for recheck. .   Amount and/or Complexity of Data Reviewed Labs: ordered. Radiology: ordered.  Risk Prescription drug management.           Final Clinical Impression(s) / ED Diagnoses Final diagnoses:  Abdominal pain, unspecified abdominal location  Enteritis    Rx / DC Orders ED Discharge Orders     None         Elpidio Anis, PA-C 10/06/23 2356

## 2023-10-06 NOTE — ED Notes (Signed)
Pt complaining of worsening pain, made triage nurse aware.

## 2023-10-06 NOTE — ED Notes (Signed)
Pt complaining of pain getting worse, made nurse aware. Pt says he has AFIB, diabetes, on a blood thinner, and that he missed his meds just sitting here doing nothing.

## 2023-10-06 NOTE — Discharge Instructions (Signed)
As we discussed, your labs are reassuring and your CT scan shows some inflammation that might indicate a mild enteritis. This is usually a viral condition and will resolve over time.   Please follow up with your doctor for recheck in 2-3 days to ensure you are improving, and return to the ED with any new or worsening symptoms at any time.

## 2023-10-06 NOTE — ED Triage Notes (Signed)
Patient reports RLQ pain, nausea, and vomiting x 2 hours. Reports constipation.

## 2023-11-03 ENCOUNTER — Other Ambulatory Visit: Payer: Self-pay | Admitting: Nurse Practitioner

## 2023-11-13 ENCOUNTER — Encounter: Payer: Medicare HMO | Admitting: Internal Medicine

## 2023-12-03 ENCOUNTER — Encounter: Payer: Self-pay | Admitting: Gastroenterology

## 2023-12-03 ENCOUNTER — Ambulatory Visit (INDEPENDENT_AMBULATORY_CARE_PROVIDER_SITE_OTHER): Payer: Medicare HMO | Admitting: Gastroenterology

## 2023-12-03 VITALS — BP 124/60 | HR 64 | Ht 67.0 in | Wt 151.6 lb

## 2023-12-03 DIAGNOSIS — Z9049 Acquired absence of other specified parts of digestive tract: Secondary | ICD-10-CM | POA: Diagnosis not present

## 2023-12-03 DIAGNOSIS — K219 Gastro-esophageal reflux disease without esophagitis: Secondary | ICD-10-CM | POA: Diagnosis not present

## 2023-12-03 DIAGNOSIS — R1030 Lower abdominal pain, unspecified: Secondary | ICD-10-CM | POA: Diagnosis not present

## 2023-12-03 DIAGNOSIS — K5909 Other constipation: Secondary | ICD-10-CM | POA: Diagnosis not present

## 2023-12-03 DIAGNOSIS — Z860100 Personal history of colon polyps, unspecified: Secondary | ICD-10-CM

## 2023-12-03 MED ORDER — LUBIPROSTONE 24 MCG PO CAPS: 24.0000 ug | ORAL_CAPSULE | Freq: Two times a day (BID) | ORAL | 3 refills | Status: DC
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
## 2023-12-03 NOTE — Patient Instructions (Addendum)
 _______________________________________________________  If your blood pressure at your visit was 140/90 or greater, please contact your primary care physician to follow up on this. _______________________________________________________  If you are age 79 or older, your body mass index should be between 23-30. Your Body mass index is 23.74 kg/m. If this is out of the aforementioned range listed, please consider follow up with your Primary Care Provider. ________________________________________________________  The  GI providers would like to encourage you to use [facility] to communicate with providers for non-urgent requests or questions.  Due to long hold times on the telephone, sending your provider a message by [facility] may be a faster and more efficient way to get a response.  Please allow 48 business hours for a response.  Please remember that this is for non-urgent requests.  _______________________________________________________  We have sent the following medications to your pharmacy for you to pick up at your convenience:  Amitiza  You will need a follow up appointment in 1 year.  We will contact you to schedule this appointment.  It was a pleasure to see you today!  Vito Cirigliano, D.O.

## 2023-12-26 ENCOUNTER — Other Ambulatory Visit: Payer: Self-pay

## 2023-12-26 MED ORDER — RIVAROXABAN 15 MG PO TABS: ORAL_TABLET | ORAL | 1 refills | Status: DC

## 2023-12-26 NOTE — Telephone Encounter (Signed)
 Received faxed refill request from [facility] for Xarelto.  Pt last saw Robin Searing, NP on 06/18/23, last labs 10/06/23 Creat 1.31, age 78, weight 68.8kg, CrCl 45.22, based on CrCl pt is on appropriate dosage of Xarelto 15mg  every day for afib.  Will refill rx.  Pt was instructed at last OV on 06/18/23 to schedule appt to est with new MD since Dr Katrinka Blazing retired.  Pt is overdue for that appt, recall in Epic.  Sent msg to schedulers to contact pt to schedule appt with new MD.

## 2024-01-01 ENCOUNTER — Telehealth: Payer: Self-pay | Admitting: Nurse Practitioner

## 2024-01-01 NOTE — Telephone Encounter (Signed)
-----   Message from Nurse Cicero Duck B sent at 12/26/2023 10:24 AM EDT ----- Former pt of Dr Katrinka Blazing, recall in Epic to est with Dr Odis Hollingshead, Dr Jacinto Halim or Dr Rosemary Holms, in 3 months from last OV with Robin Searing, NP on 06/18/23.  Please call pt to get appt to establish with MD.  Pt overdue for follow-up, pt will need for future refills.   Thank you.

## 2024-01-01 NOTE — Telephone Encounter (Signed)
 Called to schedule follow up, pt wants to do his research before scheduling. Will c/b
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
## 2024-04-15 ENCOUNTER — Encounter: Payer: Self-pay | Admitting: Nurse Practitioner

## 2024-04-15 ENCOUNTER — Ambulatory Visit: Attending: Nurse Practitioner | Admitting: Nurse Practitioner

## 2024-04-15 VITALS — BP 132/64 | HR 59 | Ht 66.0 in | Wt 147.0 lb

## 2024-04-15 DIAGNOSIS — I5022 Chronic systolic (congestive) heart failure: Secondary | ICD-10-CM | POA: Diagnosis not present

## 2024-04-15 DIAGNOSIS — I25709 Atherosclerosis of coronary artery bypass graft(s), unspecified, with unspecified angina pectoris: Secondary | ICD-10-CM | POA: Diagnosis not present

## 2024-04-15 DIAGNOSIS — I10 Essential (primary) hypertension: Secondary | ICD-10-CM

## 2024-04-15 DIAGNOSIS — I480 Paroxysmal atrial fibrillation: Secondary | ICD-10-CM

## 2024-04-15 DIAGNOSIS — E785 Hyperlipidemia, unspecified: Secondary | ICD-10-CM

## 2024-04-15 DIAGNOSIS — E1151 Type 2 diabetes mellitus with diabetic peripheral angiopathy without gangrene: Secondary | ICD-10-CM

## 2024-04-15 DIAGNOSIS — Z794 Long term (current) use of insulin: Secondary | ICD-10-CM

## 2024-04-15 NOTE — Patient Instructions (Signed)
 Medication Instructions:  Your physician recommends that you continue on your current medications as directed. Please refer to the Current Medication list given to you today. *If you need a refill on your cardiac medications before your next appointment, please call your pharmacy*  Lab Work: None Ordered If you have labs (blood work) drawn today and your tests are completely normal, you will receive your results only by: MyChart Message (if you have MyChart) OR A paper copy in the mail If you have any lab test that is abnormal or we need to change your treatment, we will call you to review the results.  Testing/Procedures: None ordered  Follow-Up: At [facility], you and your health needs are our priority.  As part of our continuing mission to provide you with exceptional heart care, our providers are all part of one team.  This team includes your primary Cardiologist (physician) and Advanced Practice Providers or APPs (Physician Assistants and Nurse Practitioners) who all work together to provide you with the care you need, when you need it.  Your next appointment:   6 month(s)  Provider:   Georganna Archer, MD    We recommend signing up for the patient portal called MyChart.  Sign up information is provided on this After Visit Summary.  MyChart is used to connect with patients for Virtual Visits (Telemedicine).  Patients are able to view lab/test results, encounter notes, upcoming appointments, etc.  Non-urgent messages can be sent to your provider as well.   To learn more about what you can do with MyChart, go to [email or web address].   Other Instructions

## 2024-06-09 ENCOUNTER — Ambulatory Visit: Admitting: Pulmonary Disease

## 2024-06-09 DIAGNOSIS — J849 Interstitial pulmonary disease, unspecified: Secondary | ICD-10-CM

## 2024-06-09 LAB — PULMONARY FUNCTION TEST
DL/VA % pred: 56 %
DL/VA: 2.26 ml/min/mmHg/L
DLCO unc % pred: 50 %
DLCO unc: 11 ml/min/mmHg
FEF 25-75 Pre: 1.72 L/s
FEF2575-%Pred-Pre: 100 %
FEV1-%Pred-Pre: 112 %
FEV1-Pre: 2.77 L
FEV1FVC-%Pred-Pre: 97 %
FEV6-%Pred-Pre: 121 %
FEV6-Pre: 3.91 L
FEV6FVC-%Pred-Pre: 106 %
FVC-%Pred-Pre: 114 %
FVC-Pre: 3.96 L
Pre FEV1/FVC ratio: 70 %
Pre FEV6/FVC Ratio: 99 %
RV % pred: 101 %
RV: 2.44 L
TLC % pred: 95 %
TLC: 5.96 L

## 2024-06-09 NOTE — Progress Notes (Signed)
 Full pft without post spiro performed today

## 2024-06-09 NOTE — Patient Instructions (Signed)
 Full pft without post spiro performed today

## 2024-06-17 ENCOUNTER — Other Ambulatory Visit

## 2024-06-23 ENCOUNTER — Ambulatory Visit
Admission: RE | Admit: 2024-06-23 | Discharge: 2024-06-23 | Disposition: A | Discharge status: Home / Self Care | Source: Ambulatory Visit | Attending: Pulmonary Disease | Admitting: Pulmonary Disease

## 2024-06-23 DIAGNOSIS — J849 Interstitial pulmonary disease, unspecified: Secondary | ICD-10-CM

## 2024-06-24 ENCOUNTER — Encounter: Payer: Self-pay | Admitting: Pulmonary Disease

## 2024-06-24 ENCOUNTER — Ambulatory Visit: Admitting: Pulmonary Disease

## 2024-06-24 VITALS — BP 106/60 | HR 67 | Temp 97.8°F | Ht 66.0 in | Wt 145.0 lb

## 2024-06-24 DIAGNOSIS — J849 Interstitial pulmonary disease, unspecified: Secondary | ICD-10-CM

## 2024-06-24 DIAGNOSIS — Z87891 Personal history of nicotine dependence: Secondary | ICD-10-CM | POA: Diagnosis not present

## 2024-06-24 DIAGNOSIS — J84178 Other interstitial pulmonary diseases with fibrosis in diseases classified elsewhere: Secondary | ICD-10-CM | POA: Diagnosis not present
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
## 2024-06-24 NOTE — Patient Instructions (Signed)
  VISIT SUMMARY: You came in today for a follow-up on your interstitial lung disease. We reviewed your lung condition, past medical history, and current symptoms.  YOUR PLAN: -INTERSTITIAL LUNG DISEASE WITH PULMONARY FIBROSIS: Interstitial lung disease with pulmonary fibrosis is a condition where the lung tissue becomes scarred and stiff, making it difficult to breathe. Your condition is well-managed and primarily affects your left lung, likely due to a past hemothorax and pleural effusion. Your recent CT scan shows stable scarring, and your lung function tests indicate a small decline in diffusion capacity compared to last year, possibly due to fatigue during the test. You are currently asymptomatic with no further decline in exercise capacity. We will order a chest CT and a lung function test in one year to monitor your condition.  INSTRUCTIONS: Please schedule a chest CT and a lung function test for one year from now to monitor your condition.

## 2024-06-25 ENCOUNTER — Other Ambulatory Visit: Payer: Self-pay | Admitting: Nurse Practitioner

## 2024-06-25 NOTE — Telephone Encounter (Signed)
 Prescription refill request for Xarelto  received.  Indication:afib Last office visit:7/25 Weight:65.8  kg Age:78 Scr:1.31  12/24 CrCl:43.25  ml/min  Prescription refilled

## 2024-06-29 ENCOUNTER — Encounter: Payer: Self-pay | Admitting: Gastroenterology
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
## 2024-07-03 ENCOUNTER — Ambulatory Visit (INDEPENDENT_AMBULATORY_CARE_PROVIDER_SITE_OTHER): Admitting: Physician Assistant

## 2024-07-03 ENCOUNTER — Ambulatory Visit: Payer: Self-pay | Admitting: Pulmonary Disease

## 2024-07-03 ENCOUNTER — Other Ambulatory Visit (INDEPENDENT_AMBULATORY_CARE_PROVIDER_SITE_OTHER)

## 2024-07-03 ENCOUNTER — Encounter: Payer: Self-pay | Admitting: Physician Assistant

## 2024-07-03 VITALS — BP 108/74 | HR 88 | Ht 65.0 in | Wt 142.4 lb

## 2024-07-03 DIAGNOSIS — R634 Abnormal weight loss: Secondary | ICD-10-CM | POA: Diagnosis not present

## 2024-07-03 DIAGNOSIS — R112 Nausea with vomiting, unspecified: Secondary | ICD-10-CM

## 2024-07-03 DIAGNOSIS — K5909 Other constipation: Secondary | ICD-10-CM

## 2024-07-03 DIAGNOSIS — Z860101 Personal history of adenomatous and serrated colon polyps: Secondary | ICD-10-CM

## 2024-07-03 DIAGNOSIS — K219 Gastro-esophageal reflux disease without esophagitis: Secondary | ICD-10-CM

## 2024-07-03 DIAGNOSIS — Z9049 Acquired absence of other specified parts of digestive tract: Secondary | ICD-10-CM

## 2024-07-03 DIAGNOSIS — Z860100 Personal history of colon polyps, unspecified: Secondary | ICD-10-CM

## 2024-07-03 LAB — COMPREHENSIVE METABOLIC PANEL WITH GFR
ALT: 35 U/L (ref 0–53)
AST: 24 U/L (ref 0–37)
Albumin: 3.7 g/dL (ref 3.5–5.2)
Alkaline Phosphatase: 39 U/L (ref 39–117)
BUN: 23 mg/dL (ref 6–23)
CO2: 29 meq/L (ref 19–32)
Calcium: 9.5 mg/dL (ref 8.4–10.5)
Chloride: 104 meq/L (ref 96–112)
Creatinine, Ser: 1.45 mg/dL (ref 0.40–1.50)
GFR: 46.06 mL/min — ABNORMAL LOW (ref >60.00)
Glucose, Bld: 103 mg/dL — ABNORMAL HIGH (ref 70–99)
Potassium: 4.1 meq/L (ref 3.5–5.1)
Sodium: 142 meq/L (ref 135–145)
Total Bilirubin: 0.7 mg/dL (ref 0.2–1.2)
Total Protein: 6.6 g/dL (ref 6.0–8.3)

## 2024-07-03 LAB — TSH: TSH: 4.17 u[IU]/mL (ref 0.35–5.50)

## 2024-07-03 LAB — CBC WITH DIFFERENTIAL/PLATELET
Basophils Absolute: 0 K/uL (ref 0.0–0.1)
Basophils Relative: 0.2 % (ref 0.0–3.0)
Eosinophils Absolute: 0.1 K/uL (ref 0.0–0.7)
Eosinophils Relative: 1.9 % (ref 0.0–5.0)
HCT: 36.8 % — ABNORMAL LOW (ref 39.0–52.0)
Hemoglobin: 12.4 g/dL — ABNORMAL LOW (ref 13.0–17.0)
Lymphocytes Relative: 21.9 % (ref 12.0–46.0)
Lymphs Abs: 1.2 K/uL (ref 0.7–4.0)
MCHC: 33.8 g/dL (ref 30.0–36.0)
MCV: 85.5 fl (ref 78.0–100.0)
Monocytes Absolute: 0.7 K/uL (ref 0.1–1.0)
Monocytes Relative: 13.1 % — ABNORMAL HIGH (ref 3.0–12.0)
Neutro Abs: 3.6 K/uL (ref 1.4–7.7)
Neutrophils Relative %: 62.9 % (ref 43.0–77.0)
Platelets: 307 K/uL (ref 150.0–400.0)
RBC: 4.3 Mil/uL (ref 4.22–5.81)
RDW: 15.4 % (ref 11.5–15.5)
WBC: 5.7 K/uL (ref 4.0–10.5)

## 2024-07-03 LAB — SEDIMENTATION RATE: Sed Rate: 12 mm/h (ref 0–20)

## 2024-07-03 LAB — CORTISOL: Cortisol, Plasma: 14.2 ug/dL

## 2024-07-03 NOTE — Patient Instructions (Addendum)
 _______________________________________________________  If your blood pressure at your visit was 140/90 or greater, please contact your primary care physician to follow up on this.  _______________________________________________________  If you are age 79 or older, your body mass index should be between 23-30. Your Body mass index is 23.69 kg/m. If this is out of the aforementioned range listed, please consider follow up with your Primary Care Provider.  If you are age 72 or younger, your body mass index should be between 19-25. Your Body mass index is 23.69 kg/m. If this is out of the aformentioned range listed, please consider follow up with your Primary Care Provider.   ________________________________________________________  The [facility] providers would like to encourage you to use MYCHART to communicate with providers for non-urgent requests or questions.  Due to long hold times on the telephone, sending your provider a message by [facility] may be a faster and more efficient way to get a response.  Please allow 48 business hours for a response.  Please remember that this is for non-urgent requests.  _______________________________________________________  [facility] is using a team-based approach to care.  Your team is made up of your doctor and two to three APPS. Our APPS (Nurse Practitioners and Physician Assistants) work with your physician to ensure care continuity for you. They are fully qualified to address your health concerns and develop a treatment plan. They communicate directly with your gastroenterologist to care for you. Seeing the Advanced Practice Practitioners on your physician's team can help you by facilitating care more promptly, often allowing for earlier appointments, access to diagnostic testing, procedures, and other specialty referrals.    Your provider has requested that you go to the basement level for lab work before leaving today. Press B on the  elevator. The lab is located at the first door on the left as you exit the elevator.  Please do the bowel purge and then start on the linzess 145- 290 mcg daily Discuss with your kids about testing or treating for SIBO (see below) Will test again for H pylori  We are ordering a [employer] stool testing for you to take home and complete.  You have received a kit from our office today containing all necessary supplies to complete this test.  Please carefully read the stool collection instructions provided in the kit before opening the accompanying materials  OR an easier way is to please use toilet paper to wipe after your bowel movement and use the qtip/applicator provided to get a small volume of the stool from the toilet paper and place that in the tube.   Important to remember: -Place the label onto the puritan opti-swab TUBE. -This label should include your full name and date of birth.  - This label should have the DATE AND TIME stool was collects -After completing the test, you should secure the tube into the specimen biohazard bag.  -The laboratory request information sheet (including date and time of specimen collection) should be placed into the outside pocket of the specimen biohazard bag and returned to the [place] lab with 2 days of collection.   If it is greater than two days from collection you will be asked to repeat the test.  If the label is missing from the tube with your name, date of birth, date and time of collection on it, you will have to repeat the test.  Any questions please message us  on my chart or call the office at [phone]  Please do the following AND  then start on linzess: Purchase a bottle of Miralax over the counter as well as a box of 5 mg dulcolax tablets. Take 4 dulcolax tablets. Wait 1 hour. You will then drink 6-8 capfuls of Miralax mixed in an adequate amount of water/juice/gatorade (you may choose which of these liquids to drink) over the next  2-3 hours. You should expect results within 1 to 6 hours after completing the bowel purge. Go to the er if you have severe AB pain, can not pass gas or stool in over 12 hours, can not hold down any food.   Linzess 145 mcg and if this does not help, increase to the 290 mcg If the linzess does not help will consider isbrela or motegrity which has different mechanisms of action *IBS-C patients may begin to experience relief from belly pain and overall abdominal symptoms (pain, discomfort, and bloating) in about 1 week,  with symptoms typically improving over 12 weeks.  Take at least 30 minutes before the first meal of the day on an empty stomach You can have a loose stool if you eat a high-fat breakfast. Give it at least 7 days, may have more bowel movements during that time.   The diarrhea should go away and you should start having normal, complete, full bowel movements.  It may be helpful to start treatment when you can be near the comfort of your own bathroom, such as a weekend.  After you are out we can send in a prescription if you did well, there is a prescription card  Toileting tips to help with your constipation - Drink at least 64-80 ounces of water/liquid per day. - Establish a time to try to move your bowels every day.  For many people, this is after a cup of coffee or after a meal such as breakfast. - Sit all of the way back on the toilet keeping your back fairly straight and while sitting up, try to rest the tops of your forearms on your upper thighs.   - Raising your feet with a step stool/squatty potty can be helpful to improve the angle that allows your stool to pass through the rectum. - Relax the rectum feeling it bulge toward the toilet water.  If you feel your rectum raising toward your body, you are contracting rather than relaxing. - Breathe in and slowly exhale. Belly breath by expanding your belly towards your belly button. Keep belly expanded as you gently direct  pressure down and back to the anus.  A low pitched GRRR sound can assist with increasing intra-abdominal pressure.  (Can also trying to blow on a pinwheel and make it move, this helps with the same belly breathing) - Repeat 3-4 times. If unsuccessful, contract the pelvic floor to restore normal tone and get off the toilet.  Avoid excessive straining. - To reduce excessive wiping by teaching your anus to normally contract, place hands on outer aspect of knees and resist knee movement outward.  Hold 5-10 second then place hands just inside of knees and resist inward movement of knees.  Hold 5 seconds.  Repeat a few times each way.  Go to the ER if unable to pass gas, severe AB pain, unable to hold down food, any shortness of breath of chest pain.   Small intestinal bacterial overgrowth (SIBO) occurs when there is an abnormal increase in the overall bacterial population in the small intestine -- particularly types of bacteria not commonly found in that part of the digestive tract. Small  intestinal bacterial overgrowth (SIBO) commonly results when a circumstance -- such as surgery or disease -- slows the passage of food and waste products in the digestive tract, creating a breeding ground for bacteria.  Signs and symptoms of SIBO often include: Loss of appetite Abdominal pain Nausea Bloating An uncomfortable feeling of fullness after eating Diarrhea or constipation, depending on the type of gas produced  What foods trigger SIBO? While foods aren't the original cause of SIBO, certain foods do encourage the overgrowth of the wrong bacteria in your small intestine. If you're feeding them their favorite foods, they're going to grow more, and that will trigger more of your SIBO symptoms. By the same token, you can help reduce the overgrowth by starving the problematic bacteria of their favorite foods. This strategy has led to a number of proposed SIBO eating plans. The plans vary, and so do individual  results. But in general, they tend to recommend limiting carbohydrates.  These include: Sugars and sweeteners. Fruits and starchy vegetables. Dairy products. Grains.  There is a test for this we can do called a breath test, if you are positive we will treat you with an antibiotic to see if it helps.  Your symptoms are very suspicious for this condition, as discussed, we will start you on an antibiotic to see if this helps.   Due to recent changes in healthcare laws, you may see the results of your imaging and laboratory studies on MyChart before your provider has had a chance to review them.  We understand that in some cases there may be results that are confusing or concerning to you. Not all laboratory results come back in the same time frame and the provider may be waiting for multiple results in order to interpret others.  Please give us  48 hours in order for your provider to thoroughly review all the results before contacting the office for clarification of your results.   Thank you for entrusting me with your care and choosing [facility].  Alan Coombs, PA-C   Pelvic Floor Dysfunction, Male     Pelvic floor dysfunction (PFD) is a condition that results when the group of muscles and connective tissues that support the organs in the pelvis (pelvic floor muscles) do not work well. These muscles and their connections form a sling that supports the colon and bladder. In men, these muscles also support the prostate gland. PFD causes pelvic floor muscles to be too weak, too tight, or both. In PFD, muscle movements are not coordinated. This may cause bowel or bladder problems. It may also cause pain. What are the causes? This condition may be caused by an injury to the pelvic area or by a weakening of pelvic muscles. In many cases, the exact cause is not known. What increases the risk? The following factors may make you more likely to develop PFD: Having chronic bladder tissue  inflammation (interstitial cystitis). Being an older person. Being overweight. History of radiation treatment for cancer in the pelvic region. Previous pelvic surgery, such as removal of the prostate gland (prostatectomy). What are the signs or symptoms? Symptoms of this condition vary and may include: Bladder symptoms, such as: Trouble starting urination and emptying the bladder. Frequent urinary tract infections. Leaking urine when coughing, laughing, or exercising (stress incontinence). Having to pass urine urgently or frequently. Pain when passing urine. Bowel symptoms, such as: Constipation. Urgent or frequent bowel movements. Incomplete bowel movements. Painful bowel movements. Leaking stool or gas. Unexplained genital or rectal  pain. Genital or rectal muscle spasms. Low back pain. Sexual dysfunction, such as erectile dysfunction, premature ejaculation, or pain during or after sexual activity. How is this diagnosed? This condition is diagnosed based on: Your symptoms and medical history. A physical exam. During the exam, your health care provider may check your pelvic muscles for tightness, spasm, pain, or weakness. This may include a rectal exam. In some cases, you may have diagnostic tests, such as: Electrical muscle function tests. Urine flow testing. X-ray tests of bowel function. Ultrasound of the pelvic organs. How is this treated? Treatment for this condition depends on your symptoms. Treatment options include: Physical therapy. This may include Kegel exercises to help relax or strengthen the pelvic floor muscles. Biofeedback. This type of therapy provides feedback on how tight your pelvic floor muscles are so that you can learn to control them. Massage therapy. A treatment that involves electrical stimulation of the pelvic floor muscles to help control pain (transcutaneous electrical nerve stimulation, or TENS). Sound wave therapy (ultrasound) to reduce muscle  spasms. Medicines, such as: Muscle relaxants. Bladder control medicines. Surgery to reconstruct or support pelvic floor muscles may be an option if other treatments do not help. Follow these instructions at home: Activity Do your usual activities as told by your health care provider. Ask your health care provider if you should modify any activities. Do pelvic floor strengthening or relaxing exercises at home as told by your physical therapist. Lifestyle Maintain a healthy weight. Eat foods that are high in fiber, such as beans, whole grains, and fresh fruits and vegetables. Limit foods that are high in fat and processed sugars, such as fried or sweet foods. Manage stress with relaxation techniques such as yoga or meditation. General instructions If you have problems with leakage: Use absorbable pads or wear padded underwear. Wash your genital and anal area frequently with mild soap. Keep your genital and anal area as clean and dry as possible. Ask your health care provider if you should try a barrier cream to prevent skin irritation. Take warm baths to relieve pelvic muscle tension or spasms. Take over-the-counter and prescription medicines only as told by your health care provider. Keep all follow-up visits. How is this prevented? The cause of PFD is not always known, but there are a few things you can do to reduce the risk of developing this condition, including: Staying at a healthy weight. Getting regular exercise. Managing stress. Contact a health care provider if: Your symptoms are not improving with home care. You have signs or symptoms of PFD that get worse. You develop new signs or symptoms. You have signs of a urinary tract infection, such as: Fever. Chills. Increased urinary frequency. A burning feeling when urinating. You have not had a bowel movement in 3 days (constipation). Summary Pelvic floor dysfunction results when the muscles and connective tissues in your  pelvic floor do not work well. These muscles and their connections form a sling that supports your colon and bladder. In men, these muscles also support the prostate gland. PFD may be caused by an injury to the pelvic area or by a weakening of pelvic muscles. PFD causes pelvic floor muscles to be too weak, too tight, or a combination of both. Symptoms may vary from person to person. In most cases, PFD can be treated with physical therapies and medicines. Surgery may be an option if other treatments do not help. This information is not intended to replace advice given to you by your health care provider.  Make sure you discuss any questions you have with your health care provider. Document Revised: 02/01/2021 Document Reviewed: 02/01/2021 Elsevier Patient Education  2024 [employer].

## 2024-07-06 ENCOUNTER — Ambulatory Visit: Payer: Self-pay | Admitting: Physician Assistant

## 2024-07-07 ENCOUNTER — Other Ambulatory Visit: Payer: Self-pay

## 2024-07-07 DIAGNOSIS — D649 Anemia, unspecified: Secondary | ICD-10-CM

## 2024-07-07 NOTE — Telephone Encounter (Signed)
 Routing message to [facility] for review.

## 2024-07-09 NOTE — Telephone Encounter (Signed)
 Per lab, add on B12 & IBC/ferritin can not be done.

## 2024-07-20 ENCOUNTER — Other Ambulatory Visit (INDEPENDENT_AMBULATORY_CARE_PROVIDER_SITE_OTHER)

## 2024-07-20 DIAGNOSIS — D649 Anemia, unspecified: Secondary | ICD-10-CM | POA: Diagnosis not present

## 2024-07-20 LAB — IBC + FERRITIN
Ferritin: 7 ng/mL — ABNORMAL LOW (ref 22.0–322.0)
Iron: 54 ug/dL (ref 42–165)
Saturation Ratios: 12.9 % — ABNORMAL LOW (ref 20.0–50.0)
TIBC: 418.6 ug/dL (ref 250.0–450.0)
Transferrin: 299 mg/dL (ref 212.0–360.0)

## 2024-07-20 LAB — VITAMIN B12: Vitamin B-12: 282 pg/mL (ref 211–911)

## 2024-07-21 ENCOUNTER — Ambulatory Visit: Payer: Self-pay | Admitting: Physician Assistant

## 2024-07-23 ENCOUNTER — Telehealth: Payer: Self-pay | Admitting: Physician Assistant

## 2024-07-23 DIAGNOSIS — A048 Other specified bacterial intestinal infections: Secondary | ICD-10-CM

## 2024-07-23 NOTE — Telephone Encounter (Signed)
 Positive H. pylori gastritis, will give Quad therapy with medication listed below for 14 days   -bismuth subsalicylate 262 mg 2 tabs 4 times daily (#112)  -metronidazole 500mg  BID (#28) -doxycyline 100mg  BID (#28) -pantoprazole  40 mg twice daily.   I recommend that the patient abstain from all alcohol while taking metronidazole.   To avoid side effects of metronidazole, I recommend that he stick to simple meals and not eat rich or spicy food.  He should always try to take your metronidazole after a meal or snack. Most common side effects include nausea, vomiting, diarrhea, stomach upset and rash.   Eight weeks after completing the treatment, an H pylori stool antigen should be performed to monitor for response to treatment.   Need to start Florastor probiotics twice a day.   Return to the office after completing treatment.

## 2024-07-27 MED ORDER — LINACLOTIDE 145 MCG PO CAPS: 145.0000 ug | ORAL_CAPSULE | Freq: Every day | ORAL | Status: DC

## 2024-07-28 NOTE — Telephone Encounter (Signed)
 See 10/14 lab result note. Patient is working on treating constipation first then will proceed with H. Pylori treatment.

## 2024-07-31 ENCOUNTER — Other Ambulatory Visit: Payer: Self-pay | Admitting: Physician Assistant

## 2024-07-31 MED ORDER — LINACLOTIDE 145 MCG PO CAPS: 145.0000 ug | ORAL_CAPSULE | Freq: Every day | ORAL | 3 refills | Status: AC

## 2024-08-02 NOTE — Progress Notes (Unsigned)
 Cardiology Office Note:    Date:  08/03/2024   ID:  Walter [name], DOB [birth year], MRN [id number]  PCP:  Dwight Trula SQUIBB, MD  Cardiologist:  Victory LELON Claudene DOUGLAS, MD (Inactive)  Electrophysiologist:  None   Referring MD: Dwight Trula SQUIBB, MD   Chief Complaint  Patient presents with   Coronary Artery Disease    History of Present Illness:    Walter [name] is a 78 y.o. male with a hx of chronic diastolic heart failure, atrial fibrillation, CAD status post CABG, OSA, ILD, hypertension, prostate cancer, CVA, former tobacco use who presents for follow-up.  Previously followed with Dr. Claudene.  Underwent CABG x 3 in 2004.  Nuclear stress test in 2014 was low risk study.  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  Carotid duplex 12/2021 showed 1 to 39% bilateral stenosis.  Since last clinic visit, he reports he is doing well.  Denies any chest pain.  Reports dyspnea only with heavy exertion, but can walk up 2 flights of stairs without any symptoms.  Does report some lightheadedness with standing.  Had syncopal episode several months ago in setting of profuse diarrhea, was having dehydration and lightheadedness.  Does report some lower extremity swelling, wears compression stockings.  Denies any palpitations.  He is on Xarelto , reports he bruises easily but denies any bleeding.  Past Medical History:  Diagnosis Date   Abdominal pain    Atrial fibrillation (HCC)    Barrett's esophagus    CAD (coronary artery disease)    Cancer (HCC)    prostate   CHF (congestive heart failure) (HCC)    Chronic constipation    Chronic kidney disease    Colon polyp    CVA (cerebral vascular accident) (HCC) 12/2014   Depression    Diabetes mellitus    w/neuropathy   Hiatal hernia    HLD (hyperlipidemia)    Hypertension    ILD (interstitial lung disease) (HCC) 2008   Kidney stone    hx of    Numbness    hands, feet   Prostate cancer (HCC) 2007   radiation tx [place]   PTSD (post-traumatic stress disorder)    Stroke [facility])    Vertigo     Past Surgical History:  Procedure Laterality Date   CATARACT EXTRACTION Bilateral 2006 and 2007   bilateral   COLON SURGERY  05/2011   hemicolectomy   COLONOSCOPY     CORONARY ARTERY BYPASS GRAFT  2004   x3    Current Medications: Current Meds  Medication Sig   amiodarone  (PACERONE ) 200 MG tablet TAKE 1 TABLET BY MOUTH EVERY DAY   buPROPion  (WELLBUTRIN  XL) 150 MG 24 hr tablet Take 150 mg by mouth daily.   Continuous Glucose Sensor (FREESTYLE LIBRE 3 PLUS SENSOR) MISC Apply topically daily.   EMBECTA PEN NEEDLE NANO 2 GEN 32G X 4 MM MISC SMARTSIG:pen needle Daily   FARXIGA 10 MG TABS tablet Take 10 mg by mouth daily. (Patient taking differently: Take 5 mg by mouth daily.)   fesoterodine  (TOVIAZ ) 8 MG TB24 tablet Take 8 mg by mouth daily.   insulin  glargine (LANTUS ) 100 UNIT/ML injection Inject 22 Units into the skin daily.   levothyroxine  (SYNTHROID ) 75 MCG tablet Take 75 mcg by mouth daily.   linaclotide (LINZESS) 145 MCG CAPS capsule Take 1 capsule (145 mcg total) by mouth daily before breakfast. EXP: 12/2024   Omega-3-acid   Ethyl Esters (LOVAZA  PO) Take 1 tablet by mouth 2 (two) times daily.   rosuvastatin  (CRESTOR ) 10 MG tablet Take 10 mg by mouth at bedtime.   tamsulosin (FLOMAX) 0.4 MG CAPS capsule Take 0.4 mg by mouth daily.   XARELTO  15 MG TABS tablet TAKE 1 TABLET (15 MG TOTAL) BY MOUTH DAILY WITH SUPPER   [DISCONTINUED] metoprolol  succinate (TOPROL -XL) 25 MG 24 hr tablet Take 0.5 tablets (12.5 mg total) by mouth daily.     Allergies:   Tape, Byetta 10 mcg pen [exenatide], Invokana [canagliflozin], Levemir [insulin  detemir], Nsaids, and Short ragweed pollen ext   Social History   Socioeconomic History   Marital status: Married    Spouse name: Advertising Copywriter   Number of children: 2   Years of education: BFA   Highest  education level: Not on file  Occupational History   Occupation: Retired     Comment: retired CONSULTING CIVIL ENGINEER  Tobacco Use   Smoking status: Former    Current packs/day: 0.00    Types: Cigarettes    Quit date: 2013    Years since quitting: 12.8   Smokeless tobacco: Never  Vaping Use   Vaping status: Some Days   Devices: once a week  Substance and Sexual Activity   Alcohol use: Yes    Alcohol/week: 3.0 standard drinks of alcohol    Types: 1 Glasses of wine, 1 Cans of beer, 1 Shots of liquor per week    Comment: social   Drug use: Not Currently    Types: Marijuana    Comment: frequently--THC   Sexual activity: Not on file  Other Topics Concern   Not on file  Social History Narrative   Left handed    Lives with wife   Caffeine -2 cups per day   Social Drivers of Corporate Investment Banker Strain: Not on file  Food Insecurity: Not on file  Transportation Needs: Not on file  Physical Activity: Not on file  Stress: Not on file  Social Connections: Not on file     Family History: The patient's family history includes Angina in his mother; Coronary artery disease in his father; Diabetes in his father; Heart disease in his mother; Hyperlipidemia in his sister; Hypertension in his mother; Neuropathy in his sister; Stroke in his father. There is no history of Colon cancer.  ROS:   Please see the history of present illness.     All other systems reviewed and are negative.  EKGs/Labs/Other Studies Reviewed:    The following studies were reviewed today:   EKG:  08/03/2024: Normal sinus rhythm, rate 75, left axis deviation  Recent Labs: 07/03/2024: ALT 35; BUN 23; Creatinine, Ser 1.45; Hemoglobin 12.4; Platelets 307.0; Potassium 4.1; Sodium 142; TSH 4.17  Recent Lipid Panel    Component Value Date/Time   CHOL 132 05/15/2023 0838   CHOL 109 02/26/2019 0824   TRIG 96 05/15/2023 0838   HDL 59 05/15/2023 0838   HDL 51 02/26/2019 0824   CHOLHDL 2.2 05/15/2023 0838   VLDL 42 (H)  12/31/2014 0555   LDLCALC 55 05/15/2023 0838    Physical Exam:    VS:  BP (!) 110/56 (BP Location: Left Arm, Patient Position: Sitting, Cuff Size: Normal)   Pulse 75   Ht 5' 7 (1.702 m)   Wt 150 lb (68 kg)   SpO2 96%   BMI 23.49 kg/m     Wt Readings from Last 3 Encounters:  08/03/24 150 lb (68 kg)  07/03/24 142 lb  6 oz (64.6 kg)  06/24/24 145 lb (65.8 kg)     GEN:  Well nourished, well developed in no acute distress HEENT: Normal NECK: No JVD; No carotid bruits LYMPHATICS: No lymphadenopathy CARDIAC: RRR, faint systolic murmur RESPIRATORY:  Clear to auscultation without rales, wheezing or rhonchi  ABDOMEN: Soft, non-tender, non-distended MUSCULOSKELETAL:  No edema; No deformity  SKIN: Warm and dry NEUROLOGIC:  Alert and oriented x 3 PSYCHIATRIC:  Normal affect   ASSESSMENT:    1. Coronary artery disease involving coronary bypass graft of native heart without angina pectoris   2. Paroxysmal atrial fibrillation (HCC)   3. Chronic diastolic heart failure (HCC)   4. Essential hypertension   5. Hyperlipidemia, unspecified hyperlipidemia type    PLAN:    CAD: Underwent CABG x 3 in 2004.  Nuclear stress test in 2014 was low risk study.  Denies anginal symptoms - Continue Xarelto  - Continue rosuvastatin   Chronic diastolic heart failure: Echocardiogram 11/2021 showed EF 60 to 65%, normal RV function, moderate left atrial enlargement, no significant valvular disease - Continue Farxiga.  Appears euvolemic  Atrial fibrillation: History of paroxysmal A-fib.  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  OSA: previously on CPAP but has lost 70 lbs and is now off CPAP  Hypertension: On Toprol -XL 12.5 mg daily.  BP has been soft and he  has been reporting orthostasis, will discontinue metoprolol   CVA: Continue Xarelto , statin  Hyperlipidemia: On rosuvastatin  10 mg daily.  LDL 42 on 05/14/2024  T2DM: On insulin  and Farxiga.  A1c 6.7% on 05/14/2024  RTC in 6 months  Medication Adjustments/Labs and Tests Ordered: Current medicines are reviewed at length with the patient today.  Concerns regarding medicines are outlined above.  Orders Placed This Encounter  Procedures   EKG 12-Lead   No orders of the defined types were placed in this encounter.   Patient Instructions  Medication Instructions:  Stop Metoprolol  as discussed with your provider *If you need a refill on your cardiac medications before your next appointment, please call your pharmacy*  Lab Work: none If you have labs (blood work) drawn today and your tests are completely normal, you will receive your results only by: MyChart Message (if you have MyChart) OR A paper copy in the mail If you have any lab test that is abnormal or we need to change your treatment, we will call you to review the results.  Testing/Procedures: none  Follow-Up: At [facility], you and your health needs are our priority.  As part of our continuing mission to provide you with exceptional heart care, our providers are all part of one team.  This team includes your primary Cardiologist (physician) and Advanced Practice Providers or APPs (Physician Assistants and Nurse Practitioners) who all work together to provide you with the care you need, when you need it.  Your next appointment:   6 month  Provider:   Dr. Kate  We recommend signing up for the patient portal called MyChart.  Sign up information is provided on this After Visit Summary.  MyChart is used to connect with patients for Virtual Visits (Telemedicine).  Patients are able to view lab/test results, encounter notes, upcoming appointments, etc.  Non-urgent messages can be sent to your provider as well.   To  learn more about what you can do with MyChart, go to [email or web address].   Other Instructions none           Signed, Lonni LITTIE Nanas, MD  08/03/2024 10:03 AM    [facility]

## 2024-08-03 ENCOUNTER — Ambulatory Visit: Attending: Cardiology | Admitting: Cardiology

## 2024-08-03 ENCOUNTER — Encounter: Payer: Self-pay | Admitting: Physician Assistant

## 2024-08-03 ENCOUNTER — Encounter: Payer: Self-pay | Admitting: Cardiology

## 2024-08-03 VITALS — BP 110/56 | HR 75 | Ht 67.0 in | Wt 150.0 lb

## 2024-08-03 DIAGNOSIS — I10 Essential (primary) hypertension: Secondary | ICD-10-CM | POA: Diagnosis not present

## 2024-08-03 DIAGNOSIS — I480 Paroxysmal atrial fibrillation: Secondary | ICD-10-CM | POA: Diagnosis not present

## 2024-08-03 DIAGNOSIS — I5032 Chronic diastolic (congestive) heart failure: Secondary | ICD-10-CM | POA: Diagnosis not present

## 2024-08-03 DIAGNOSIS — I25810 Atherosclerosis of coronary artery bypass graft(s) without angina pectoris: Secondary | ICD-10-CM

## 2024-08-03 DIAGNOSIS — E785 Hyperlipidemia, unspecified: Secondary | ICD-10-CM

## 2024-08-03 NOTE — Patient Instructions (Signed)
 Medication Instructions:  Stop Metoprolol  as discussed with your provider *If you need a refill on your cardiac medications before your next appointment, please call your pharmacy*  Lab Work: none If you have labs (blood work) drawn today and your tests are completely normal, you will receive your results only by: MyChart Message (if you have MyChart) OR A paper copy in the mail If you have any lab test that is abnormal or we need to change your treatment, we will call you to review the results.  Testing/Procedures: none  Follow-Up: At [facility], you and your health needs are our priority.  As part of our continuing mission to provide you with exceptional heart care, our providers are all part of one team.  This team includes your primary Cardiologist (physician) and Advanced Practice Providers or APPs (Physician Assistants and Nurse Practitioners) who all work together to provide you with the care you need, when you need it.  Your next appointment:   6 month  Provider:   Dr. Kate  We recommend signing up for the patient portal called MyChart.  Sign up information is provided on this After Visit Summary.  MyChart is used to connect with patients for Virtual Visits (Telemedicine).  Patients are able to view lab/test results, encounter notes, upcoming appointments, etc.  Non-urgent messages can be sent to your provider as well.   To learn more about what you can do with MyChart, go to [email or web address].   Other Instructions none
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
## 2024-08-14 ENCOUNTER — Ambulatory Visit: Admitting: Physician Assistant

## 2024-08-14 ENCOUNTER — Encounter: Payer: Self-pay | Admitting: Physician Assistant

## 2024-08-14 VITALS — BP 118/68 | HR 67 | Ht 67.0 in | Wt 153.0 lb

## 2024-08-14 DIAGNOSIS — Z860100 Personal history of colon polyps, unspecified: Secondary | ICD-10-CM | POA: Diagnosis not present

## 2024-08-14 DIAGNOSIS — K5909 Other constipation: Secondary | ICD-10-CM | POA: Diagnosis not present

## 2024-08-14 DIAGNOSIS — R112 Nausea with vomiting, unspecified: Secondary | ICD-10-CM

## 2024-08-14 DIAGNOSIS — A048 Other specified bacterial intestinal infections: Secondary | ICD-10-CM | POA: Diagnosis not present

## 2024-08-14 DIAGNOSIS — D649 Anemia, unspecified: Secondary | ICD-10-CM | POA: Diagnosis not present

## 2024-08-14 DIAGNOSIS — Z9049 Acquired absence of other specified parts of digestive tract: Secondary | ICD-10-CM

## 2024-08-14 DIAGNOSIS — R634 Abnormal weight loss: Secondary | ICD-10-CM

## 2024-08-14 DIAGNOSIS — I480 Paroxysmal atrial fibrillation: Secondary | ICD-10-CM

## 2024-08-14 DIAGNOSIS — I5022 Chronic systolic (congestive) heart failure: Secondary | ICD-10-CM

## 2024-08-14 DIAGNOSIS — K219 Gastro-esophageal reflux disease without esophagitis: Secondary | ICD-10-CM

## 2024-08-14 DIAGNOSIS — J849 Interstitial pulmonary disease, unspecified: Secondary | ICD-10-CM

## 2024-08-14 NOTE — Patient Instructions (Addendum)
 B12 is mainly in meat, so increase meat can help this but often people have a deficiency that they are just not absorbing it well with meat or pills, so get the sublingual/melt in your mouth one.   If the sublingual one dose not increase your level, we will discuss shots.   Consider iron infusions since you can tolerate your oral iron  Positive H. pylori gastritis, will give Quad therapy with medication listed below for 14 days WHEN YOU ARE READY, PLEASE MESSAGE AND I WILL SEND THESE MEDICATIONS TO YOUR PHARMACY!!!!!    -bismuth subsalicylate 262 mg 2 tabs 4 times daily (#112)  -metronidazole 500mg  BID (#28) -doxycyline 100mg  BID (#28) -pantoprazole  40 mg twice daily.   I recommend that the patient abstain from all alcohol while taking metronidazole.   To avoid side effects of metronidazole, I recommend that he stick to simple meals and not eat rich or spicy food.  He should always try to take your metronidazole after a meal or snack. Most common side effects include nausea, vomiting, diarrhea, stomach upset and rash.   Eight weeks after completing the treatment, an H pylori diatherix should be performed to monitor for response to treatment.   Need to start Florastor probiotics twice a day.   Return to the office after completing treatment.  Advised to go to the ER if there is any severe weakness, severe abdominal pain, vomit blood, dark red blood in your bowel movement, shortness of breath or chest pain.    Vitamin B12 Deficiency Vitamin B12 deficiency occurs when the body does not have enough vitamin B12, which is an important vitamin. The body needs this vitamin: To make red blood cells. To make DNA. This is the genetic material inside cells. To help the nerves work properly so they can carry messages from the brain to the body. Vitamin B12 deficiency can cause various health problems, such as a low red blood cell count (anemia) or nerve damage. What are the causes? This condition  may be caused by: Not eating enough foods that contain vitamin B12. Not having enough stomach acid and digestive fluids to properly absorb vitamin B12 from the food that you eat. Certain digestive system diseases that make it hard to absorb vitamin B12. These diseases include Crohn's disease, chronic pancreatitis, and cystic fibrosis. A condition in which the body does not make enough of a protein (intrinsic factor), resulting in too few red blood cells (pernicious anemia). Having a surgery in which part of the stomach or small intestine is removed. Taking certain medicines that make it hard for the body to absorb vitamin B12. These medicines include: Heartburn medicines (antacids and proton pump inhibitors). Certain antibiotic medicines. Some medicines that are used to treat diabetes, tuberculosis, gout, or high cholesterol. What increases the risk? The following factors may make you more likely to develop a B12 deficiency: Being older than age 42. Eating a vegetarian or vegan diet, especially while you are pregnant. Eating a poor diet while you are pregnant. Taking certain medicines. Having alcoholism. What are the signs or symptoms? In some cases, there are no symptoms of this condition. If the condition leads to anemia or nerve damage, various symptoms can occur, such as: Weakness. Fatigue. Loss of appetite. Weight loss. Numbness or tingling in your hands and feet. Redness and burning of the tongue. Confusion or memory problems. Depression. Sensory problems, such as color blindness, ringing in the ears, or loss of taste. Diarrhea or constipation. Trouble walking. If anemia is  severe, symptoms can include: Shortness of breath. Dizziness. Rapid heart rate (tachycardia). How is this diagnosed? This condition may be diagnosed with a blood test to measure the level of vitamin B12 in your blood. You may also have other tests, including: A group of tests that measure certain  characteristics of blood cells (complete blood count, CBC). A blood test to measure intrinsic factor. A procedure where a thin tube with a camera on the end is used to look into your stomach or intestines (endoscopy). Other tests may be needed to discover the cause of B12 deficiency. How is this treated? Treatment for this condition depends on the cause. This condition may be treated by: Changing your eating and drinking habits, such as: Eating more foods that contain vitamin B12. Drinking less alcohol or no alcohol. Getting vitamin B12 injections. Taking vitamin B12 supplements. Your health care provider will tell you which dosage is best for you. Follow these instructions at home: Eating and drinking  Eat lots of healthy foods that contain vitamin B12, including: Meats and poultry. This includes beef, pork, chicken, turkey, and organ meats, such as liver. Seafood. This includes clams, rainbow trout, salmon, tuna, and haddock. Eggs. Cereal and dairy products that are fortified. This means that vitamin B12 has been added to the food. Check the label on the package to see if the food is fortified. The items listed above may not be a complete list of recommended foods and beverages. Contact a dietitian for more information. General instructions Get any injections that are prescribed by your health care provider. Take supplements only as told by your health care provider. Follow the directions carefully. Do not drink alcohol if your health care provider tells you not to. In some cases, you may only be asked to limit alcohol use. Keep all follow-up visits as told by your health care provider. This is important. Contact a health care provider if: Your symptoms come back. Get help right away if you: Develop shortness of breath. Have a rapid heart rate. Have chest pain. Become dizzy or lose consciousness. Summary Vitamin B12 deficiency occurs when the body does not have enough vitamin  B12. The main causes of vitamin B12 deficiency include dietary deficiency, digestive diseases, pernicious anemia, and having a surgery in which part of the stomach or small intestine is removed. In some cases, there are no symptoms of this condition. If the condition leads to anemia or nerve damage, various symptoms can occur, such as weakness, shortness of breath, and numbness. Treatment may include getting vitamin B12 injections or taking vitamin B12 supplements. Eat lots of healthy foods that contain vitamin B12. This information is not intended to replace advice given to you by your health care provider. Make sure you discuss any questions you have with your health care provider. Document Revised: 03/13/2019 Document Reviewed: 06/03/2018 Elsevier Patient Education  2020 [employer].  I appreciate the  opportunity to care for you  Thank You   Amanda Collier,PA-C

## 2024-09-04 ENCOUNTER — Emergency Department (HOSPITAL_COMMUNITY)
Admission: EM | Admit: 2024-09-04 | Discharge: 2024-09-04 | Discharge status: Against Medical Advice | Attending: Emergency Medicine | Admitting: Emergency Medicine

## 2024-09-04 ENCOUNTER — Encounter (HOSPITAL_COMMUNITY): Payer: Self-pay

## 2024-09-04 DIAGNOSIS — I482 Chronic atrial fibrillation, unspecified: Secondary | ICD-10-CM | POA: Insufficient documentation

## 2024-09-04 DIAGNOSIS — R002 Palpitations: Secondary | ICD-10-CM | POA: Diagnosis present

## 2024-09-04 DIAGNOSIS — Z7901 Long term (current) use of anticoagulants: Secondary | ICD-10-CM | POA: Insufficient documentation

## 2024-09-04 DIAGNOSIS — Z5321 Procedure and treatment not carried out due to patient leaving prior to being seen by health care provider: Secondary | ICD-10-CM | POA: Insufficient documentation

## 2024-09-04 LAB — CBC WITH DIFFERENTIAL/PLATELET
Abs Immature Granulocytes: 0.02 K/uL (ref 0.00–0.07)
Basophils Absolute: 0 K/uL (ref 0.0–0.1)
Basophils Relative: 1 %
Eosinophils Absolute: 0.1 K/uL (ref 0.0–0.5)
Eosinophils Relative: 1 %
HCT: 33.5 % — ABNORMAL LOW (ref 39.0–52.0)
Hemoglobin: 10.8 g/dL — ABNORMAL LOW (ref 13.0–17.0)
Immature Granulocytes: 0 %
Lymphocytes Relative: 24 %
Lymphs Abs: 1.2 K/uL (ref 0.7–4.0)
MCH: 28.1 pg (ref 26.0–34.0)
MCHC: 32.2 g/dL (ref 30.0–36.0)
MCV: 87.2 fL (ref 80.0–100.0)
Monocytes Absolute: 0.6 K/uL (ref 0.1–1.0)
Monocytes Relative: 11 %
Neutro Abs: 3.2 K/uL (ref 1.7–7.7)
Neutrophils Relative %: 63 %
Platelets: 259 K/uL (ref 150–400)
RBC: 3.84 MIL/uL — ABNORMAL LOW (ref 4.22–5.81)
RDW: 14.5 % (ref 11.5–15.5)
WBC: 5.1 K/uL (ref 4.0–10.5)
nRBC: 0 % (ref 0.0–0.2)

## 2024-09-04 LAB — BASIC METABOLIC PANEL WITH GFR
Anion gap: 8 (ref 5–15)
BUN: 20 mg/dL (ref 8–23)
CO2: 26 mmol/L (ref 22–32)
Calcium: 8.9 mg/dL (ref 8.9–10.3)
Chloride: 107 mmol/L (ref 98–111)
Creatinine, Ser: 1.2 mg/dL (ref 0.61–1.24)
GFR, Estimated: >60 mL/min (ref >60)
Glucose, Bld: 151 mg/dL — ABNORMAL HIGH (ref 70–99)
Potassium: 4.2 mmol/L (ref 3.5–5.1)
Sodium: 141 mmol/L (ref 135–145)

## 2024-09-04 LAB — MAGNESIUM: Magnesium: 2 mg/dL (ref 1.7–2.4)

## 2024-09-04 NOTE — ED Triage Notes (Signed)
 Per EMS, Pt, from home, c/o intermittent heart fluttering starting today and dizziness w/ positional changes.  Denies pain.  Hx of A Fib.

## 2024-09-04 NOTE — ED Provider Triage Note (Signed)
 Emergency Medicine Provider Triage Evaluation Note  Walter [name] , a 78 y.o. male  was evaluated in triage.  Pt complains of abnormal heartbeat.  Patient has a history of A-fib.  He is on Eliquis and amiodarone .  He is not on a beta-blocker.  States that he checked his blood pressure today.  There was an indication of an abnormal heartbeat.  Patient felt some heart fluttering sensation.  He reports having this in the past with A-fib, but it is very infrequent.  No lightheadedness or syncope.  No chest pains.  Currently patient feels back to baseline, just a little woozy.  Review of Systems  Positive: Palpitations Negative: Chest pain, shortness of breath  Physical Exam  BP 127/78   Pulse 68   Temp 97.8 F (36.6 C) (Oral)   Resp 18   Ht 5' 7 (1.702 m)   Wt 69.4 kg   SpO2 100%   BMI 23.96 kg/m  Gen:   Awake, no distress   Resp:  Normal effort  MSK:   Moves extremities without difficulty  Other:  Heart currently regular rhythm  Medical Decision Making  Medically screening exam initiated at 2:28 PM.  Appropriate orders placed.  Walter [name] was informed that the remainder of the evaluation will be completed by another provider, this initial triage assessment does not replace that evaluation, and the importance of remaining in the ED until their evaluation is complete.  Patient with paroxysmal A-fib, compliant with anticoagulation.  On amiodarone .  Seems to be regular rhythm at this time.   Walter Chew, PA-C 09/04/24 1430

## 2024-09-04 NOTE — ED Notes (Signed)
 Pt came through the ed door stating that he had been waiting and was not waiting anymore. He is requesting to know when he will be called back. This nurse let him know that I could not give him that information. I let him know that he was triaged and that's how it worked with getting patients called back. He requested for the IV to be removed so he could leave. This nurse removed his IV and he told me to have a good day. He let this nurse know that his son was a doctor in [place]  and he would never have to wait there.

## 2024-09-06 ENCOUNTER — Encounter (HOSPITAL_BASED_OUTPATIENT_CLINIC_OR_DEPARTMENT_OTHER): Payer: Self-pay

## 2024-09-06 ENCOUNTER — Emergency Department (HOSPITAL_BASED_OUTPATIENT_CLINIC_OR_DEPARTMENT_OTHER)
Admission: EM | Admit: 2024-09-06 | Discharge: 2024-09-06 | Disposition: A | Discharge status: Home / Self Care | Attending: Emergency Medicine | Admitting: Emergency Medicine

## 2024-09-06 ENCOUNTER — Emergency Department (HOSPITAL_BASED_OUTPATIENT_CLINIC_OR_DEPARTMENT_OTHER): Admitting: Radiology

## 2024-09-06 DIAGNOSIS — E119 Type 2 diabetes mellitus without complications: Secondary | ICD-10-CM | POA: Diagnosis not present

## 2024-09-06 DIAGNOSIS — I509 Heart failure, unspecified: Secondary | ICD-10-CM | POA: Insufficient documentation

## 2024-09-06 DIAGNOSIS — Z7984 Long term (current) use of oral hypoglycemic drugs: Secondary | ICD-10-CM | POA: Diagnosis not present

## 2024-09-06 DIAGNOSIS — R42 Dizziness and giddiness: Secondary | ICD-10-CM | POA: Diagnosis present

## 2024-09-06 DIAGNOSIS — Z7901 Long term (current) use of anticoagulants: Secondary | ICD-10-CM | POA: Insufficient documentation

## 2024-09-06 DIAGNOSIS — I4891 Unspecified atrial fibrillation: Secondary | ICD-10-CM | POA: Diagnosis not present

## 2024-09-06 DIAGNOSIS — Z79899 Other long term (current) drug therapy: Secondary | ICD-10-CM | POA: Diagnosis not present

## 2024-09-06 DIAGNOSIS — I130 Hypertensive heart and chronic kidney disease with heart failure and stage 1 through stage 4 chronic kidney disease, or unspecified chronic kidney disease: Secondary | ICD-10-CM | POA: Diagnosis not present

## 2024-09-06 DIAGNOSIS — E039 Hypothyroidism, unspecified: Secondary | ICD-10-CM | POA: Insufficient documentation

## 2024-09-06 DIAGNOSIS — I483 Typical atrial flutter: Secondary | ICD-10-CM | POA: Diagnosis not present

## 2024-09-06 DIAGNOSIS — Z794 Long term (current) use of insulin: Secondary | ICD-10-CM | POA: Insufficient documentation

## 2024-09-06 DIAGNOSIS — R739 Hyperglycemia, unspecified: Secondary | ICD-10-CM | POA: Diagnosis not present

## 2024-09-06 DIAGNOSIS — N189 Chronic kidney disease, unspecified: Secondary | ICD-10-CM | POA: Insufficient documentation

## 2024-09-06 LAB — BASIC METABOLIC PANEL WITH GFR
Anion gap: 9 (ref 5–15)
BUN: 19 mg/dL (ref 8–23)
CO2: 27 mmol/L (ref 22–32)
Calcium: 9.5 mg/dL (ref 8.9–10.3)
Chloride: 104 mmol/L (ref 98–111)
Creatinine, Ser: 1.16 mg/dL (ref 0.61–1.24)
GFR, Estimated: >60 mL/min (ref >60)
Glucose, Bld: 218 mg/dL — ABNORMAL HIGH (ref 70–99)
Potassium: 4.4 mmol/L (ref 3.5–5.1)
Sodium: 140 mmol/L (ref 135–145)

## 2024-09-06 LAB — CBC
HCT: 35.6 % — ABNORMAL LOW (ref 39.0–52.0)
Hemoglobin: 11.6 g/dL — ABNORMAL LOW (ref 13.0–17.0)
MCH: 28 pg (ref 26.0–34.0)
MCHC: 32.6 g/dL (ref 30.0–36.0)
MCV: 85.8 fL (ref 80.0–100.0)
Platelets: 259 K/uL (ref 150–400)
RBC: 4.15 MIL/uL — ABNORMAL LOW (ref 4.22–5.81)
RDW: 14.4 % (ref 11.5–15.5)
WBC: 6.1 K/uL (ref 4.0–10.5)
nRBC: 0 % (ref 0.0–0.2)

## 2024-09-06 LAB — TROPONIN T, HIGH SENSITIVITY
Troponin T High Sensitivity: 23 ng/L — ABNORMAL HIGH (ref 0–19)
Troponin T High Sensitivity: 24 ng/L — ABNORMAL HIGH (ref 0–19)

## 2024-09-06 LAB — MAGNESIUM: Magnesium: 1.9 mg/dL (ref 1.7–2.4)

## 2024-09-06 NOTE — ED Notes (Signed)
   09/06/24 1546  Oxygen Therapy/Pulse Ox  O2 Device Room Air (Ambulating: 100%, decreased to 98%, pt stated he was feeling light headed, returned back to the room.)  SpO2 100 %

## 2024-09-06 NOTE — ED Triage Notes (Signed)
 Pt reports home BP monitor reported pt was in a-fib. Hx a-fib. Pt lightheaded. Reports went to [facility] by EMS Friday for same sx but by time he got his EKG, it was normal and he went home. Pt on Xarelto  and amiodarone .

## 2024-09-06 NOTE — ED Notes (Signed)
 DC paperwork given and verbally understood.... Pt given the Afib Clinic Phone Number, via the PA and instructed to call them if they dont call him tomorrow.... Pt understood.

## 2024-09-06 NOTE — ED Notes (Signed)
 Spoke with lab to ad on mag

## 2024-09-06 NOTE — Discharge Instructions (Signed)
 Seen in the emergency room today with irregular heart rate, your ECG showed atrial flutter, but your heart rate is normal.  Remainder of your workup was reassuring.  I discussed your care with Dr. Mona, who is the on-call cardiologist.  He agreed that there is no reason to cardiovert you at this time, but he is going to reach out to the A-fib clinic to get you a close follow-up appointment. Come back if you have new or worsening symptoms.

## 2024-09-06 NOTE — ED Provider Notes (Signed)
 [place] EMERGENCY DEPARTMENT AT [facility] Provider Note   CSN: [id number] Arrival date & time: 09/06/24  1314     Patient presents with: Irregular Heart Beat and Dizziness   Walter [name] is a 78 y.o. male.  Striae of paroxysmal A-fib on Xarelto  and amiodarone , hypertension, heart failure, type 2 diabetes, CVA, and additional lung disease and hypothyroidism.  Presents to ER today for evaluation of palpitations and lightheadedness.  He states he felt like he was in A-fib again.   He had this feeling 2 days ago and went to the ED at [facility] but once he had gotten there he was back in sinus rhythm so he left from the waiting room, had recurrence of symptoms today and presents back to the ER.   He states his symptoms are now improved, no chest pain or shortness of breath, he states when this happens he just feels lightheaded.  Denies abdominal pain, nausea or vomiting, denies any missed doses of medication.   {Add pertinent medical, surgical, social history, OB history to HPI:32947}  Dizziness      Prior to Admission medications   Medication Sig Start Date End Date Taking? Authorizing Provider  amiodarone  (PACERONE ) 200 MG tablet TAKE 1 TABLET BY MOUTH EVERY DAY 11/05/23   Dick, Ernest H Jr., NP  buPROPion  (WELLBUTRIN  XL) 150 MG 24 hr tablet Take 150 mg by mouth daily.    [provider]  Continuous Glucose Sensor (FREESTYLE LIBRE 3 PLUS SENSOR) MISC Apply topically daily. 03/26/24   [provider]  EMBECTA PEN NEEDLE NANO 2 GEN 32G X 4 MM MISC SMARTSIG:pen needle Daily 05/11/24   [provider]  FARXIGA 10 MG TABS tablet Take 10 mg by mouth daily. Patient taking differently: Take 5 mg by mouth daily. 09/10/22   [provider]  fesoterodine  (TOVIAZ ) 8 MG TB24 tablet Take 8 mg by mouth daily. 05/24/21   [provider]  insulin  glargine (LANTUS ) 100 UNIT/ML injection Inject 22 Units into the skin daily.    [provider]   levothyroxine  (SYNTHROID ) 75 MCG tablet Take 75 mcg by mouth daily. 06/16/23   [provider]  linaclotide  (LINZESS ) 145 MCG CAPS capsule Take 1 capsule (145 mcg total) by mouth daily before breakfast. EXP: 12/2024 07/31/24 07/26/25  Craig Alan SAUNDERS, PA-C  Omega-3-acid  Ethyl Esters (LOVAZA  PO) Take 1 tablet by mouth 2 (two) times daily.    [provider]  rosuvastatin  (CRESTOR ) 10 MG tablet Take 10 mg by mouth at bedtime.    [provider]  tamsulosin (FLOMAX) 0.4 MG CAPS capsule Take 0.4 mg by mouth daily.    [provider]  XARELTO  15 MG TABS tablet TAKE 1 TABLET (15 MG TOTAL) BY MOUTH DAILY WITH SUPPER 06/25/24   Wyn Jackee VEAR Mickey., NP    Allergies: Tape, Byetta 10 mcg pen [exenatide], Invokana [canagliflozin], Levemir [insulin  detemir], Nsaids, and Short ragweed pollen ext    Review of Systems  Neurological:  Positive for dizziness.    Updated Vital Signs BP 121/75   Pulse 84   Temp 98.3 F (36.8 C) (Oral)   Resp 11   SpO2 100%   Physical Exam Vitals and nursing note reviewed.  Constitutional:      General: He is not in acute distress.    Appearance: He is well-developed.  HENT:     Head: Normocephalic and atraumatic.     Nose: Nose normal.     Mouth/Throat:     Mouth:  Mucous membranes are moist.  Eyes:     Extraocular Movements: Extraocular movements intact.     Conjunctiva/sclera: Conjunctivae normal.     Pupils: Pupils are equal, round, and reactive to light.  Cardiovascular:     Rate and Rhythm: Normal rate and regular rhythm.     Heart sounds: No murmur heard. Pulmonary:     Effort: Pulmonary effort is normal. No respiratory distress.     Breath sounds: Normal breath sounds.  Abdominal:     Palpations: Abdomen is soft.     Tenderness: There is no abdominal tenderness.  Musculoskeletal:        General: No swelling.     Cervical back: Neck supple.  Skin:    General: Skin is warm and dry.     Capillary Refill: Capillary  refill takes less than 2 seconds.  Neurological:     General: No focal deficit present.     Mental Status: He is alert and oriented to person, place, and time.  Psychiatric:        Mood and Affect: Mood normal.     (all labs ordered are listed, but only abnormal results are displayed) Labs Reviewed  BASIC METABOLIC PANEL WITH GFR - Abnormal; Notable for the following components:      Result Value   Glucose, Bld 218 (*)    All other components within normal limits  CBC - Abnormal; Notable for the following components:   RBC 4.15 (*)    Hemoglobin 11.6 (*)    HCT 35.6 (*)    All other components within normal limits  TROPONIN T, HIGH SENSITIVITY - Abnormal; Notable for the following components:   Troponin T High Sensitivity 23 (*)    All other components within normal limits  TROPONIN T, HIGH SENSITIVITY - Abnormal; Notable for the following components:   Troponin T High Sensitivity 24 (*)    All other components within normal limits  MAGNESIUM    EKG: EKG Interpretation Date/Time:  Sunday September 06 2024 14:23:19 EST Ventricular Rate:  80 PR Interval:    QRS Duration:  107 QT Interval:  384 QTC Calculation: 421 R Axis:   -53  Text Interpretation: Atrial flutter LAD, consider left anterior fascicular block Abnormal R-wave progression, late transition Nonspecific T abnormalities, inferior leads Heart rate decreased compared to prior EKG Confirmed by Ellouise Fine ([id number]) on 09/06/2024 2:52:17 PM  Radiology: DG Chest 2 View Result Date: 09/06/2024 CLINICAL DATA:  Irregular RP. EXAM: CHEST - 2 VIEW COMPARISON:  12/03/2022.  CT, 06/23/2024. FINDINGS: Stable changes from a prior median sternotomy. Cardiac silhouette is normal in size and configuration. No mediastinal or hilar masses. Lungs are mildly hyperexpanded. Bilateral interstitial thickening, stable from the prior exam no lung consolidation or edema. No pleural effusion or pneumothorax. Skeletal structures are intact.  IMPRESSION: No active cardiopulmonary disease. Electronically Signed   By: Alm Parkins M.D.   On: 09/06/2024 14:09    {Document cardiac monitor, telemetry assessment procedure when appropriate:32947} Procedures   Medications Ordered in the ED - No data to display    {Click here for ABCD2, HEART and other calculators REFRESH Note before signing:1}                              Medical Decision Making This patient presents to the ED for concern of palpitations and lightheadedness, this involves an extensive number of treatment options, and is a complaint that carries with it  a high risk of complications and morbidity.  The differential diagnosis includes arrhythmia, orthostatic hypotension, dehydration, ACS, PE, pneumonia, other   Co morbidities that complicate the patient evaluation :   A-fib, CHF, diabetes, CAD, hypertension, hypothyroidism, CKD   Additional history obtained:  Additional history obtained from EMR External records from outside source obtained and reviewed including notes, labs and imaging   Lab Tests:  I Ordered, and personally interpreted labs.  The pertinent results include: Troponin mildly elevated at 23 but delta is only 1 with subsequent being 24.  Magnesium normal, BMP with mild hyperglycemia but normal anion gap, CBC with no leukocytosis, baseline hemoglobin   Imaging Studies ordered:  I ordered imaging studies including chest x-ray which shows prior sternotomy, no acute findings I independently visualized and interpreted imaging within scope of identifying emergent findings  I agree with the radiologist interpretation   Cardiac Monitoring: / EKG:  The patient was maintained on a cardiac monitor.  I personally viewed and interpreted the cardiac monitored which showed an underlying rhythm of: Atrial flutter   Consultations Obtained:  I requested consultation with the cardiologist Dr. Mona,  and discussed lab and imaging findings as well as pertinent  plan - they recommend: No indication for cardioversion as patient is rate controlled and symptoms have improved, he will have A-fib clinic reach out to patient tomorrow to schedule close follow-up appointment.   Problem List / ED Course / Critical interventions / Medication management  Irregular heart rate-patient noted to be in atrial flutter, has a history of A-fib and is on Xarelto  and amiodarone  which he has been compliant with.  Electrolytes were normal, symptoms have improved but he is still in a flutter although rate controlled.  Given his reassuring workup and normal heart rate even when ambulating, do not feel there is indication for cardioversion emergently and cardiology was agreeable with this, as they state he is likely to go back in to this rhythm since he had gone in and out of it a couple of days ago as well.  They are going to arrange close follow-up.  Patient was agreeable with plan of care and discharge.  Instructed on return precautions.  I have reviewed the patients home medicines and have made adjustments as needed   Social Determinants of Health:  Patient lives with his spouse   Test / Admission - Considered:  No indication for admission at this time.    Amount and/or Complexity of Data Reviewed Labs: ordered. Radiology: ordered.     {Document critical care time when appropriate  Document review of labs and clinical decision tools ie CHADS2VASC2, etc  Document your independent review of radiology images and any outside records  Document your discussion with family members, caretakers and with consultants  Document social determinants of health affecting pt's care  Document your decision making why or why not admission, treatments were needed:32947:::1}   Final diagnoses:  Typical atrial flutter [facility])    ED Discharge Orders     None

## 2024-09-08 ENCOUNTER — Encounter (HOSPITAL_COMMUNITY): Payer: Self-pay | Admitting: Physician Assistant

## 2024-09-08 ENCOUNTER — Ambulatory Visit (HOSPITAL_COMMUNITY)
Admission: RE | Admit: 2024-09-08 | Discharge: 2024-09-08 | Disposition: A | Discharge status: Home / Self Care | Source: Ambulatory Visit | Attending: Physician Assistant | Admitting: Physician Assistant

## 2024-09-08 VITALS — BP 132/76 | HR 87 | Ht 67.0 in | Wt 152.2 lb

## 2024-09-08 DIAGNOSIS — Z5181 Encounter for therapeutic drug level monitoring: Secondary | ICD-10-CM

## 2024-09-08 DIAGNOSIS — I480 Paroxysmal atrial fibrillation: Secondary | ICD-10-CM

## 2024-09-08 DIAGNOSIS — I4891 Unspecified atrial fibrillation: Secondary | ICD-10-CM

## 2024-09-08 DIAGNOSIS — I10 Essential (primary) hypertension: Secondary | ICD-10-CM | POA: Diagnosis not present

## 2024-09-08 DIAGNOSIS — Z79899 Other long term (current) drug therapy: Secondary | ICD-10-CM

## 2024-09-08 DIAGNOSIS — D6859 Other primary thrombophilia: Secondary | ICD-10-CM | POA: Diagnosis not present

## 2024-09-08 DIAGNOSIS — G4733 Obstructive sleep apnea (adult) (pediatric): Secondary | ICD-10-CM

## 2024-09-08 MED ORDER — METOPROLOL SUCCINATE ER 25 MG PO TB24: 25.0000 mg | ORAL_TABLET | Freq: Every day | ORAL | 6 refills | Status: DC

## 2024-09-08 NOTE — Patient Instructions (Signed)
 Start Metoprolol  25mg  once a day at bedtime   [employer] (1 - Lead) or Gar[employer] Smart watch

## 2024-09-08 NOTE — Progress Notes (Unsigned)
 Chief Complaint: Prostate cancer follow up   History of Present Illness:  He originally presented in 2008. The patient was treated with combination radiotherapy in 2007 at [facility] in 2007. 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 PSAs have been almost undetectable since that time.  His biggest issue has been worsening lower urinary tract symptoms.  Frequency, urgency and urgency incontinence.  He saw Dr. Lovie at [facility] recently.  He was switched from Toviaz  to Gemtesa.  With that, urinary symptoms got worse, he has near-total incontinence.  Has been wearing incontinence briefs/pull-ups recently.  No gross hematuria, no dysuria.  He stopped the Gemtesa 36 hours ago and started the Toviaz  back.  With that, he has been leaking less.    Past Medical History:  Past Medical History:  Diagnosis Date   Abdominal pain    Atrial fibrillation (HCC)    Barrett's esophagus    CAD (coronary artery disease)    Cancer (HCC)    prostate   CHF (congestive heart failure) (HCC)    Chronic constipation    Chronic kidney disease    Colon polyp    CVA (cerebral vascular accident) (HCC) 12/2014   Depression    Diabetes mellitus    w/neuropathy   Hiatal hernia    HLD (hyperlipidemia)    Hypertension    ILD (interstitial lung disease) (HCC) 2008   Kidney stone    hx of   Numbness    hands, feet   Prostate cancer (HCC) 2007   radiation tx [place]   PTSD (post-traumatic stress disorder)    Stroke [facility] [place] [facility])    Vertigo     Past Surgical History:  Past Surgical History:  Procedure Laterality Date   CATARACT EXTRACTION Bilateral 2006 and 2007   bilateral   COLON SURGERY  05/2011   hemicolectomy   COLONOSCOPY     CORONARY ARTERY BYPASS GRAFT  2004   x3    Allergies:  Allergies  Allergen Reactions   Tape Other (See Comments)    Skin tears    Byetta 10 Mcg Pen [Exenatide] Nausea Only   Invokana [Canagliflozin] Other (See Comments)    Orthostatic  hypotension   Levemir [Insulin  Detemir] Other (See Comments)    Burning at injection site   Nsaids    Short Ragweed Pollen Ext Cough    Family History:  Family History  Problem Relation Age of Onset   Angina Mother    Hypertension Mother    Heart disease Mother    Diabetes Father    Coronary artery disease Father    Stroke Father    Hyperlipidemia Sister    Neuropathy Sister    Colon cancer Neg Hx     Social History:  Social History   Tobacco Use   Smoking status: Former    Current packs/day: 0.00    Types: Cigarettes    Quit date: 2013    Years since quitting: 12.9   Smokeless tobacco: Never  Vaping Use   Vaping status: Some Days   Devices: once a week  Substance Use Topics   Alcohol use: Yes    Alcohol/week: 3.0 standard drinks of alcohol    Types: 1 Glasses of wine, 1 Cans of beer, 1 Shots of liquor per week    Comment: social   Drug use: Not Currently    Types: Marijuana    Comment: frequently--THC    Review of symptoms:  Constitutional:  Negative  for unexplained weight loss, night sweats, fever, chills ENT:  Negative for nose bleeds, sinus pain, painful swallowing CV:  Negative for chest pain, shortness of breath, exercise intolerance, palpitations, loss of consciousness Resp:  Negative for cough, wheezing, shortness of breath GI:  Negative for nausea, vomiting, diarrhea, bloody stools GU:  Positives noted in HPI; otherwise negative for gross hematuria, dysuria, urinary incontinence Neuro:  Negative for seizures, poor balance, limb weakness, slurred speech Psych:  Negative for lack of energy, depression, anxiety Endocrine:  Negative for polydipsia, polyuria, symptoms of hypoglycemia (dizziness, hunger, sweating) Hematologic:  Negative for anemia, purpura, petechia, prolonged or excessive bleeding, use of anticoagulants  Allergic:  Negative for difficulty breathing or choking as a result of exposure to anything; no shellfish allergy; no allergic response  (rash/itch) to materials, foods  Physical exam: There were no vitals taken for this visit. GENERAL APPEARANCE:  Well appearing, well developed, well nourished, NAD HEENT: Atraumatic, Normocephalic. NECK: Normal appearance LUNGS: Normal inspiratory and expiratory excursion HEART: Regular Rate ABDOMEN: No inguinal hernias GU: Phallus normal, no lesions. Scrotal skin normal. Testicles atrophic bilaterally.  Meatus normal. Normal anal sphincter tone, prostate small, nonnodular. EXTREMITIES: Moves all extremities well.  Without clubbing, cyanosis, or edema. NEUROLOGIC:  Alert and oriented x 3, normal gait, CN II-XII grossly intact.  MENTAL STATUS:  Appropriate. SKIN:  Warm, dry and intact.    Results:  I have reviewed referring/prior physicians notes--old [facility] records reviewed  I have reviewed urinalysis--clear  I have reviewed PSA and pathology results  I have reviewed prior imaging--residual urine volume in 2023 was 70 mL.  Residual urine volume today 185   Assessment: 1.  History of low risk prostate cancer, status post combination radiotherapy in [place] , completed in 2007.  No evidence of recurrence  2.  Significant lower urinary tract symptoms with increasing residual urine volume.   Plan: 1.  He will continue on the Toviaz , stay off of the Gemtesa  2.  I will see him back in a week or 2 for residual urine check.  If still high, consider cystoscopy.
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
## 2024-09-09 ENCOUNTER — Encounter: Admitting: Gastroenterology

## 2024-09-09 ENCOUNTER — Ambulatory Visit: Admitting: Urology

## 2024-09-09 VITALS — BP 115/59 | HR 120 | Ht 67.0 in | Wt 150.0 lb

## 2024-09-09 DIAGNOSIS — R339 Retention of urine, unspecified: Secondary | ICD-10-CM | POA: Diagnosis not present

## 2024-09-09 DIAGNOSIS — R350 Frequency of micturition: Secondary | ICD-10-CM | POA: Diagnosis not present

## 2024-09-09 DIAGNOSIS — N3941 Urge incontinence: Secondary | ICD-10-CM | POA: Diagnosis not present

## 2024-09-09 DIAGNOSIS — Z8546 Personal history of malignant neoplasm of prostate: Secondary | ICD-10-CM

## 2024-09-09 DIAGNOSIS — N3281 Overactive bladder: Secondary | ICD-10-CM

## 2024-09-09 LAB — URINALYSIS, ROUTINE W REFLEX MICROSCOPIC
Bilirubin, UA: NEGATIVE
Ketones, UA: NEGATIVE
Leukocytes,UA: NEGATIVE
Nitrite, UA: NEGATIVE
RBC, UA: NEGATIVE
Specific Gravity, UA: 1.02 (ref 1.005–1.030)
Urobilinogen, Ur: 0.2 mg/dL (ref 0.2–1.0)
pH, UA: 5.5 (ref 5.0–7.5)

## 2024-09-09 LAB — MICROSCOPIC EXAMINATION: Bacteria, UA: NONE SEEN

## 2024-09-09 LAB — BLADDER SCAN AMB NON-IMAGING: Scan Result: 185

## 2024-09-14 ENCOUNTER — Encounter: Admitting: Gastroenterology

## 2024-09-15 ENCOUNTER — Ambulatory Visit (HOSPITAL_COMMUNITY): Admitting: Physician Assistant

## 2024-09-15 ENCOUNTER — Telehealth: Payer: Self-pay | Admitting: Cardiology

## 2024-09-15 NOTE — Telephone Encounter (Signed)
   Received call on outpatient line regarding HR readings that concerned patient over the last 24 hours. He was previously on Toprol  25 mg daily, this was reduced to 12.5 mg daily in July 2025 then fully discontinued in October 2025. He was just recently started back on Toprol  25 mg at bedtime 09/08/2024. He tells me that his BP readings have been fairly stable but his HR has been ranging from mid 40s-50s. He denies any symptoms, reports no lightheadedness, dizziness, syncope, presyncope. He tells me that overall he feels okay but just wanted to check in regarding his Toprol  since he has had issues in the past. I recommended holding the Toprol  and seeing his HR response. We discussed signs and symptoms that would be concerning and when to go to the ER. Patient is comfortable with holding Toprol , continuing to monitor response and arranging close follow up with our A-Fib clinic. Follow up appointment has been made. Patient was instructed to call back or go to the ER if anything changes or if he develops symptoms. Patient verbalizes understanding. All questions were answered.   Plan: stop Toprol  25 mg, follow up with A-fib clinic. Continue to monitor HR. Call back/go to ER if develops symptoms.   Waddell DELENA Donath, PA-C 09/15/2024 7:19 AM

## 2024-09-18 ENCOUNTER — Other Ambulatory Visit: Payer: Self-pay | Admitting: Nurse Practitioner

## 2024-09-22 ENCOUNTER — Other Ambulatory Visit (HOSPITAL_COMMUNITY): Payer: Self-pay | Admitting: *Deleted

## 2024-09-22 ENCOUNTER — Telehealth (HOSPITAL_COMMUNITY): Payer: Self-pay | Admitting: *Deleted

## 2024-09-22 MED ORDER — METOPROLOL SUCCINATE ER 25 MG PO TB24: 25.0000 mg | ORAL_TABLET | Freq: Every day | ORAL | Status: AC

## 2024-09-22 NOTE — Telephone Encounter (Signed)
 Pt called and reported he started back his metoprolol  25 mg that was stopped 09/15/24. He states he is now tolerating it with HR in the 70's with BP 130's/70. I discussed with R.Fenton PA. He agrees pt may resume. Pt will continue to monitor and call if any concerns.

## 2024-09-22 NOTE — Progress Notes (Unsigned)
 Assessment: 1.  History of low risk prostate cancer, status post combination radiotherapy in [place] , completed in 2007.  No evidence of recurrence  2.  Significant lower urinary tract symptoms with increasing residual urine volume.   Plan: 1.  He will continue on the Toviaz , stay off of the Gemtesa  2.  I will see him back in a week or 2 for residual urine check.  If still high, consider cystoscopy.   History of Present Illness:  12.3.2025: He originally presented to see me @ [facility] in 2008. The patient was previously  treated with combination radiotherapy in 2007 at [facility] in 2007. 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 PSAs have been almost undetectable since that time.  His biggest issue has been worsening lower urinary tract symptoms.  Frequency, urgency and urgency incontinence.  He saw Dr. Lovie at [facility] [place] recently.  He was switched from Toviaz  to Gemtesa.  With that, urinary symptoms got worse, he has near-total incontinence.  Has been wearing incontinence briefs/pull-ups recently.  No gross hematuria, no dysuria.  He stopped the Gemtesa 36 hours ago and started the Toviaz  back.  With that, he has been leaking less.    Past Medical History:  Past Medical History:  Diagnosis Date   Abdominal pain    Atrial fibrillation (HCC)    Barrett's esophagus    CAD (coronary artery disease)    Cancer (HCC)    prostate   CHF (congestive heart failure) (HCC)    Chronic constipation    Chronic kidney disease    Colon polyp    CVA (cerebral vascular accident) (HCC) 12/2014   Depression    Diabetes mellitus    w/neuropathy   Hiatal hernia    HLD (hyperlipidemia)    Hypertension    ILD (interstitial lung disease) (HCC) 2008   Kidney stone    hx of   Numbness    hands, feet   Prostate cancer (HCC) 2007   radiation tx [place]   PTSD (post-traumatic stress disorder)    Stroke [facility])    Vertigo     Past Surgical History:  Past Surgical  History:  Procedure Laterality Date   CATARACT EXTRACTION Bilateral 2006 and 2007   bilateral   COLON SURGERY  05/2011   hemicolectomy   COLONOSCOPY     CORONARY ARTERY BYPASS GRAFT  2004   x3    Allergies:  Allergies  Allergen Reactions   Tape Other (See Comments)    Skin tears    Byetta 10 Mcg Pen [Exenatide] Nausea Only   Invokana [Canagliflozin] Other (See Comments)    Orthostatic hypotension   Levemir [Insulin  Detemir] Other (See Comments)    Burning at injection site   Nsaids    Short Ragweed Pollen Ext Cough    Family History:  Family History  Problem Relation Age of Onset   Angina Mother    Hypertension Mother    Heart disease Mother    Diabetes Father    Coronary artery disease Father    Stroke Father    Hyperlipidemia Sister    Neuropathy Sister    Colon cancer Neg Hx     Social History:  Social History   Tobacco Use   Smoking status: Former    Current packs/day: 0.00    Types: Cigarettes    Quit date: 2013    Years since quitting: 12.9   Smokeless tobacco: Never  Vaping Use  Vaping status: Some Days   Devices: once a week  Substance Use Topics   Alcohol use: Yes    Alcohol/week: 3.0 standard drinks of alcohol    Types: 1 Glasses of wine, 1 Cans of beer, 1 Shots of liquor per week    Comment: social   Drug use: Not Currently    Types: Marijuana    Comment: frequently--THC    Review of symptoms:  Constitutional:  Negative for unexplained weight loss, night sweats, fever, chills ENT:  Negative for nose bleeds, sinus pain, painful swallowing CV:  Negative for chest pain, shortness of breath, exercise intolerance, palpitations, loss of consciousness Resp:  Negative for cough, wheezing, shortness of breath GI:  Negative for nausea, vomiting, diarrhea, bloody stools GU:  Positives noted in HPI; otherwise negative for gross hematuria, dysuria, urinary incontinence Neuro:  Negative for seizures, poor balance, limb weakness, slurred  speech Psych:  Negative for lack of energy, depression, anxiety Endocrine:  Negative for polydipsia, polyuria, symptoms of hypoglycemia (dizziness, hunger, sweating) Hematologic:  Negative for anemia, purpura, petechia, prolonged or excessive bleeding, use of anticoagulants  Allergic:  Negative for difficulty breathing or choking as a result of exposure to anything; no shellfish allergy; no allergic response (rash/itch) to materials, foods  Physical exam: There were no vitals taken for this visit. GENERAL APPEARANCE:  Well appearing, well developed, well nourished, NAD HEENT: Atraumatic, Normocephalic. NECK: Normal appearance LUNGS: Normal inspiratory and expiratory excursion HEART: Regular Rate ABDOMEN: No inguinal hernias GU: Phallus normal, no lesions. Scrotal skin normal. Testicles atrophic bilaterally.  Meatus normal. Normal anal sphincter tone, prostate small, nonnodular. EXTREMITIES: Moves all extremities well.  Without clubbing, cyanosis, or edema. NEUROLOGIC:  Alert and oriented x 3, normal gait, CN II-XII grossly intact.  MENTAL STATUS:  Appropriate. SKIN:  Warm, dry and intact.    Results:  I have reviewed referring/prior physicians notes--old [facility] records reviewed  I have reviewed urinalysis--clear  I have reviewed PSA and pathology results  I have reviewed prior imaging--residual urine volume in 2023 was 70 mL.  Residual urine volume today 185

## 2024-09-23 ENCOUNTER — Ambulatory Visit: Admitting: Urology

## 2024-09-23 DIAGNOSIS — R339 Retention of urine, unspecified: Secondary | ICD-10-CM

## 2024-09-23 DIAGNOSIS — N3281 Overactive bladder: Secondary | ICD-10-CM

## 2024-09-23 DIAGNOSIS — Z8546 Personal history of malignant neoplasm of prostate: Secondary | ICD-10-CM | POA: Diagnosis not present

## 2024-09-23 LAB — MICROSCOPIC EXAMINATION

## 2024-09-23 LAB — URINALYSIS, ROUTINE W REFLEX MICROSCOPIC
Bilirubin, UA: NEGATIVE
Ketones, UA: NEGATIVE
Leukocytes,UA: NEGATIVE
Nitrite, UA: NEGATIVE
RBC, UA: NEGATIVE
Specific Gravity, UA: 1.02 (ref 1.005–1.030)
Urobilinogen, Ur: 0.2 mg/dL (ref 0.2–1.0)
pH, UA: 7 (ref 5.0–7.5)

## 2024-09-23 LAB — BLADDER SCAN AMB NON-IMAGING

## 2024-10-02 ENCOUNTER — Ambulatory Visit (HOSPITAL_COMMUNITY)
Admission: RE | Admit: 2024-10-02 | Discharge: 2024-10-02 | Disposition: A | Discharge status: Home / Self Care | Source: Ambulatory Visit | Attending: Internal Medicine | Admitting: Internal Medicine

## 2024-10-02 VITALS — BP 112/58 | HR 56 | Ht 67.0 in | Wt 157.2 lb

## 2024-10-02 DIAGNOSIS — D6869 Other thrombophilia: Secondary | ICD-10-CM | POA: Diagnosis not present

## 2024-10-02 DIAGNOSIS — Z79899 Other long term (current) drug therapy: Secondary | ICD-10-CM | POA: Diagnosis not present

## 2024-10-02 DIAGNOSIS — I480 Paroxysmal atrial fibrillation: Secondary | ICD-10-CM

## 2024-10-02 DIAGNOSIS — I4891 Unspecified atrial fibrillation: Secondary | ICD-10-CM

## 2024-10-02 DIAGNOSIS — Z5181 Encounter for therapeutic drug level monitoring: Secondary | ICD-10-CM | POA: Diagnosis not present
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
## 2024-10-27 ENCOUNTER — Telehealth: Payer: Self-pay | Admitting: Urology

## 2024-10-27 NOTE — Telephone Encounter (Signed)
 Patient called and stated that there is blood in his urine. He is afraid that it is a UTI. I offered to send Dr. Matilda a message as well as schedule an appointment. He got upset and refused to make the appointment. I asked if he went to the ER or urgent care and he said no, he refused to go. He would like to know what he should do.

## 2024-10-28 NOTE — Telephone Encounter (Signed)
 Spoke with pt regarding message. Offered pt to drop off urine sample to our lab and pt states he visited urgent care and was put on Abx. Pt states that if he takes the full course of antibiotics and is still seeing blood in his urine that he will call back and make an appt.

## 2025-03-09 ENCOUNTER — Ambulatory Visit (HOSPITAL_COMMUNITY): Admitting: Nurse Practitioner

## 2025-10-04 ENCOUNTER — Ambulatory Visit: Admitting: Urology
